# Patient Record
Sex: Female | Born: 2000
Health system: Southern US, Community
[De-identification: ages and names within clinical notes are randomized; demographics above are authoritative.]

## PROBLEM LIST (undated history)

## (undated) DIAGNOSIS — N390 Urinary tract infection, site not specified: Secondary | ICD-10-CM

## (undated) DIAGNOSIS — R51 Headache: Secondary | ICD-10-CM

## (undated) DIAGNOSIS — G43909 Migraine, unspecified, not intractable, without status migrainosus: Secondary | ICD-10-CM

## (undated) DIAGNOSIS — R519 Headache, unspecified: Secondary | ICD-10-CM

## (undated) DIAGNOSIS — F32A Depression, unspecified: Secondary | ICD-10-CM

## (undated) DIAGNOSIS — G8220 Paraplegia, unspecified: Secondary | ICD-10-CM

## (undated) DIAGNOSIS — Z9289 Personal history of other medical treatment: Secondary | ICD-10-CM

## (undated) DIAGNOSIS — F419 Anxiety disorder, unspecified: Secondary | ICD-10-CM

## (undated) DIAGNOSIS — Z8041 Family history of malignant neoplasm of ovary: Secondary | ICD-10-CM

## (undated) DIAGNOSIS — W3400XA Accidental discharge from unspecified firearms or gun, initial encounter: Secondary | ICD-10-CM

## (undated) DIAGNOSIS — F329 Major depressive disorder, single episode, unspecified: Secondary | ICD-10-CM

## (undated) DIAGNOSIS — L97519 Non-pressure chronic ulcer of other part of right foot with unspecified severity: Secondary | ICD-10-CM

## (undated) HISTORY — DX: Anxiety disorder, unspecified: F41.9

## (undated) HISTORY — DX: Urinary tract infection, site not specified: N39.0

## (undated) HISTORY — DX: Major depressive disorder, single episode, unspecified: F32.9

## (undated) HISTORY — DX: Family history of malignant neoplasm of ovary: Z80.41

## (undated) HISTORY — DX: Depression, unspecified: F32.A

---
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
## 2018-01-07 ENCOUNTER — Encounter: Payer: BLUE CROSS/BLUE SHIELD | Attending: Internal Medicine | Admitting: Internal Medicine

## 2018-01-07 DIAGNOSIS — G8222 Paraplegia, incomplete: Secondary | ICD-10-CM | POA: Insufficient documentation

## 2018-01-07 DIAGNOSIS — L03115 Cellulitis of right lower limb: Secondary | ICD-10-CM | POA: Insufficient documentation

## 2018-01-07 DIAGNOSIS — L89892 Pressure ulcer of other site, stage 2: Secondary | ICD-10-CM | POA: Diagnosis not present
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
## 2018-01-08 NOTE — Progress Notes (Signed)
Angela, [name] ([id number]) Visit Report for 01/07/2018 Arrival Information Details Patient Name: Angela [name], Angela [name] Date of Service: 01/07/2018 2:30 PM Medical Record Number: [id number] Patient Account Number: [id number] Date of Birth/Sex: 2001-01-29 (17 y.o. F) Treating RN: Rema Jasmine Primary Care Jenniger Figiel: Roda Shutters Other Clinician: Referring Daleigh Pollinger: Roda Shutters Treating Baila Rouse/Extender: Altamese [place] in Treatment: 2 Visit Information History Since Last Visit Added or deleted any medications: No Patient Arrived: Wheel Chair Any new allergies or adverse reactions: No Arrival Time: 14:27 Had a fall or experienced change in No activities of daily living that may affect Accompanied By: dad risk of falls: Transfer Assistance: None Signs or symptoms of abuse/neglect since last visito No Patient Identification Verified: Yes Hospitalized since last visit: No Secondary Verification Process Completed: Yes Implantable device outside of the clinic excluding No Patient Requires Transmission-Based No cellular tissue based products placed in the center Precautions: since last visit: Patient Has Alerts: No Has Dressing in Place as Prescribed: Yes Pain Present Now: No Electronic Signature(s) Signed: 01/07/2018 5:04:11 PM By: Rema Jasmine Entered By: Rema Jasmine on 01/07/2018 14:39:32 Angela [name] ([id number]) -------------------------------------------------------------------------------- Encounter Discharge Information Details Patient Name: Angela [name] Date of Service: 01/07/2018 2:30 PM Medical Record Number: [id number] Patient Account Number: [id number] Date of Birth/Sex: 2001-05-16 (16 y.o. F) Treating RN: Curtis Sites Primary Care Rozelia Catapano: Roda Shutters Other Clinician: Referring Garet Hooton: Roda Shutters Treating Tiarah Shisler/Extender: Altamese [place] in Treatment: 2 Encounter Discharge Information Items Discharge Condition:  Stable Ambulatory Status: Wheelchair Discharge Destination: Home Transportation: Private Auto Accompanied By: self Schedule Follow-up Appointment: Yes Clinical Summary of Care: Electronic Signature(s) Signed: 01/07/2018 3:25:47 PM By: Curtis Sites Entered By: Curtis Sites on 01/07/2018 15:25:47 Angela [name] ([id number]) -------------------------------------------------------------------------------- Lower Extremity Assessment Details Patient Name: Angela [name] Date of Service: 01/07/2018 2:30 PM Medical Record Number: [id number] Patient Account Number: [id number] Date of Birth/Sex: 12/18/2000 (17 y.o. F) Treating RN: Rema Jasmine Primary Care Dai Mcadams: Roda Shutters Other Clinician: Referring [place] Deleo: Roda Shutters Treating Julieanne Hadsall/Extender: Altamese [place] in Treatment: 2 Edema Assessment Assessed: [Left: No] [Right: No] [Left: Edema] [Right: :] Calf Left: Right: Point of Measurement: 37 cm From Medial Instep cm 36.8 cm Ankle Left: Right: Point of Measurement: 11 cm From Medial Instep cm 26.3 cm Vascular Assessment Claudication: Claudication Assessment [Right:None] Pulses: Dorsalis Pedis Palpable: [Right:Yes] Posterior Tibial Extremity colors, hair growth, and conditions: Extremity Color: [Right:Normal] Hair Growth on Extremity: [Right:No] Temperature of Extremity: [Right:Warm] Capillary Refill: [Right:< 3 seconds] Toe Nail Assessment Left: Right: Thick: No Discolored: No Deformed: No Improper Length and Hygiene: No Electronic Signature(s) Signed: 01/07/2018 5:04:11 PM By: Rema Jasmine Entered By: Rema Jasmine on 01/07/2018 14:46:35 Angela [name] ([id number]) -------------------------------------------------------------------------------- Multi Wound Chart Details Patient Name: Angela Boast I. Date of Service: 01/07/2018 2:30 PM Medical Record Number: [id number] Patient Account Number: [id number] Date of Birth/Sex: [birth year] (16 y.o.  F) Treating RN: Renne Crigler Primary Care Ayat Drenning: Roda Shutters Other Clinician: Referring Nataline Basara: Roda Shutters Treating Dustan Hyams/Extender: Altamese [place] in Treatment: 2 Vital Signs Height(in): 66 Pulse(bpm): 92 Weight(lbs): 190 Blood Pressure(mmHg): 114/74 Body Mass Index(BMI): 31 Temperature(F): 98.4 Respiratory Rate 16 (breaths/min): Photos: [1:No Photos] [N/A:N/A] Wound Location: [1:Right Metatarsal head fifth - Lateral] [N/A:N/A] Wounding Event: [1:Gradually Appeared] [N/A:N/A] Primary Etiology: [1:Pressure Ulcer] [N/A:N/A] Comorbid History: [1:Anemia, Paraplegia] [N/A:N/A] Date Acquired: [1:09/23/2017] [N/A:N/A] Weeks of Treatment: [1:2] [N/A:N/A] Wound Status: [1:Open] [N/A:N/A] Measurements L x W x D [1:0.9x0.8x0.7] [N/A:N/A] (cm) Area (cm) : [1:0.565] [N/A:N/A] Volume (cm) : [1:0.396] [N/A:N/A] %  Reduction in Area: [1:11.20%] [N/A:N/A] % Reduction in Volume: [1:37.70%] [N/A:N/A] Classification: [1:Category/Stage II] [N/A:N/A] Exudate Amount: [1:Medium] [N/A:N/A] Exudate Type: [1:Serosanguineous] [N/A:N/A] Exudate Color: [1:red, brown] [N/A:N/A] Wound Margin: [1:Distinct, outline attached] [N/A:N/A] Granulation Amount: [1:Large (67-100%)] [N/A:N/A] Granulation Quality: [1:Red] [N/A:N/A] Necrotic Amount: [1:Small (1-33%)] [N/A:N/A] Necrotic Tissue: [1:Eschar] [N/A:N/A] Exposed Structures: [1:Fascia: No Fat Layer (Subcutaneous Tissue) Exposed: No Tendon: No Muscle: No Joint: No Bone: No] [N/A:N/A] Epithelialization: [1:None] [N/A:N/A] Periwound Skin Texture: [1:Callus: Yes] [N/A:N/A] Periwound Skin Moisture: [1:No Abnormalities Noted] [N/A:N/A] Periwound Skin Color: [1:Erythema: Yes] [N/A:N/A] Erythema Location: [1:Circumferential] [N/A:N/A] Temperature: [1:No Abnormality] [N/A:N/A] Tenderness on Palpation: Yes N/A N/A Wound Preparation: Ulcer Cleansing: N/A N/A Rinsed/Irrigated with Saline Topical Anesthetic Applied: Other:  lidocaine 4% Treatment Notes Electronic Signature(s) Signed: 01/07/2018 5:14:04 PM By: Renne Crigler Entered By: Renne Crigler on 01/07/2018 14:55:48 Angela [name] ([id number]) -------------------------------------------------------------------------------- Multi-Disciplinary Care Plan Details Patient Name: Angela [name] Date of Service: 01/07/2018 2:30 PM Medical Record Number: [id number] Patient Account Number: [id number] Date of Birth/Sex: 03-Jan-2001 (17 y.o. F) Treating RN: Renne Crigler Primary Care Cylis Ayars: Roda Shutters Other Clinician: Referring Hassen Bruun: Roda Shutters Treating Yamna Mackel/Extender: Altamese [place] in Treatment: 2 Active Inactive ` Orientation to the Wound Care Program Nursing Diagnoses: Knowledge deficit related to the wound healing center program Goals: Patient/caregiver will verbalize understanding of the Wound Healing Center Program Date Initiated: 12/24/2017 Target Resolution Date: 01/24/2018 Goal Status: Active Interventions: Provide education on orientation to the wound center Notes: ` Pressure Nursing Diagnoses: Potential for impaired tissue integrity related to pressure, friction, moisture, and shear Goals: Patient/caregiver will verbalize understanding of pressure ulcer management Date Initiated: 12/24/2017 Target Resolution Date: 01/24/2018 Goal Status: Active Interventions: Assess potential for pressure ulcer upon admission and as needed Notes: ` Wound/Skin Impairment Nursing Diagnoses: Impaired tissue integrity Goals: Ulcer/skin breakdown will heal within 14 weeks Date Initiated: 12/24/2017 Target Resolution Date: 03/27/2018 Goal Status: Active Interventions: TOYOKO, SILOS I. ([id number]) Provide education on ulcer and skin care Treatment Activities: Skin care regimen initiated : 12/24/2017 Notes: Electronic Signature(s) Signed: 01/07/2018 5:14:04 PM By: Renne Crigler Entered By: Renne Crigler on  01/07/2018 14:55:40 Angela Boast IMarland Kitchen ([id number]) -------------------------------------------------------------------------------- Pain Assessment Details Patient Name: Angela [name] Date of Service: 01/07/2018 2:30 PM Medical Record Number: [id number] Patient Account Number: [id number] Date of Birth/Sex: 07-07-2000 (17 y.o. F) Treating RN: Rema Jasmine Primary Care Tamiah Dysart: Roda Shutters Other Clinician: Referring Brea Coleson: Roda Shutters Treating Travonna Swindle/Extender: Altamese [place] in Treatment: 2 Active Problems Location of Pain Severity and Description of Pain Patient Has Paino No Site Locations Pain Management and Medication Current Pain Management: Electronic Signature(s) Signed: 01/07/2018 5:04:11 PM By: Rema Jasmine Entered By: Rema Jasmine on 01/07/2018 14:39:44 Angela [name] ([id number]) -------------------------------------------------------------------------------- Patient/Caregiver Education Details Patient Name: Angela [name] Date of Service: 01/07/2018 2:30 PM Medical Record Number: [id number] Patient Account Number: [id number] Date of Birth/Gender: 2001-05-09 (16 y.o. F) Treating RN: Curtis Sites Primary Care Physician: Roda Shutters Other Clinician: Referring Physician: Roda Shutters Treating Physician/Extender: Altamese [place] in Treatment: 2 Education Assessment Education Provided To: Patient Education Topics Provided Wound/Skin Impairment: Handouts: Other: wound care as ordered Methods: Demonstration, Explain/Verbal Responses: State content correctly Electronic Signature(s) Signed: 01/07/2018 5:21:12 PM By: Curtis Sites Entered By: Curtis Sites on 01/07/2018 15:26:38 Angela [name] ([id number]) -------------------------------------------------------------------------------- Wound Assessment Details Patient Name: Angela [name] Date of Service: 01/07/2018 2:30 PM Medical Record Number: [id number] Patient Account  Number: [id number] Date of Birth/Sex: Sep 17, 2000 (16 y.o. F) Treating RN: Rema Jasmine Primary Care Shevelle Smither: Noralyn Pick  HILLARY Other Clinician: Referring Dajsha Massaro: Roda ShuttersARROLL, HILLARY Treating Marks Scalera/Extender: Maxwell CaulOBSON, MICHAEL G Weeks in Treatment: 2 Wound Status Wound Number: 1 Primary Etiology: Pressure Ulcer Wound Location: Right Metatarsal head fifth - Lateral Wound Status: Open Wounding Event: Gradually Appeared Comorbid History: Anemia, Paraplegia Date Acquired: 09/23/2017 Weeks Of Treatment: 2 Clustered Wound: No Wound Measurements Length: (cm) 0.9 Width: (cm) 0.8 Depth: (cm) 0.7 Area: (cm) 0.565 Volume: (cm) 0.396 % Reduction in Area: 11.2% % Reduction in Volume: 37.7% Epithelialization: None Tunneling: No Undermining: No Wound Description Classification: Category/Stage II Wound Margin: Distinct, outline attached Exudate Amount: Medium Exudate Type: Serosanguineous Exudate Color: red, brown Foul Odor After Cleansing: No Slough/Fibrino No Wound Bed Granulation Amount: Large (67-100%) Exposed Structure Granulation Quality: Red Fascia Exposed: No Necrotic Amount: Small (1-33%) Fat Layer (Subcutaneous Tissue) Exposed: No Necrotic Quality: Eschar Tendon Exposed: No Muscle Exposed: No Joint Exposed: No Bone Exposed: No Periwound Skin Texture Texture Color No Abnormalities Noted: No No Abnormalities Noted: No Callus: Yes Erythema: Yes Erythema Location: Circumferential Moisture No Abnormalities Noted: No Temperature / Pain Temperature: No Abnormality Tenderness on Palpation: Yes Wound Preparation Ulcer Cleansing: Rinsed/Irrigated with Saline Topical Anesthetic Applied: Other: lidocaine 4%, Treatment Notes Angela [name], Angela I. ([id number]) Wound #1 (Right, Lateral Metatarsal head fifth) 1. Cleansed with: Clean wound with Normal Saline 2. Anesthetic Topical Lidocaine 4% cream to wound bed prior to debridement 4. Dressing Applied: Other dressing (specify in  notes) 5. Secondary Dressing Applied ABD Pad Dry Gauze Kerlix/Conform Notes endoform, abd conform wrap with stretch net Electronic Signature(s) Signed: 01/07/2018 5:04:11 PM By: Rema JasmineNg, Wendi Entered By: Rema JasmineNg, Wendi on 01/07/2018 14:43:32 Angela [name], Angela I. ([id number]) -------------------------------------------------------------------------------- Vitals Details Patient Name: Angela [name], Angela I. Date of Service: 01/07/2018 2:30 PM Medical Record Number: [id number] Patient Account Number: [id number] Date of Birth/Sex: 12/14/2000 (16 y.o. F) Treating RN: Rema JasmineNg, Wendi Primary Care Hammond Obeirne: Roda ShuttersARROLL, HILLARY Other Clinician: Referring Copeland Neisen: Roda ShuttersARROLL, HILLARY Treating Yassin Scales/Extender: Altamese CarolinaOBSON, MICHAEL G Weeks in Treatment: 2 Vital Signs Time Taken: 14:30 Temperature (F): 98.4 Height (in): 66 Pulse (bpm): 92 Weight (lbs): 190 Respiratory Rate (breaths/min): 16 Body Mass Index (BMI): 30.7 Blood Pressure (mmHg): 114/74 Reference Range: 80 - 120 mg / dl Electronic Signature(s) Signed: 01/07/2018 5:04:11 PM By: Rema JasmineNg, Wendi Entered By: Rema JasmineNg, Wendi on 01/07/2018 14:40:13

## 2018-01-13 ENCOUNTER — Emergency Department (HOSPITAL_COMMUNITY): Payer: BLUE CROSS/BLUE SHIELD

## 2018-01-13 ENCOUNTER — Other Ambulatory Visit: Payer: Self-pay

## 2018-01-13 ENCOUNTER — Encounter (HOSPITAL_COMMUNITY): Payer: Self-pay | Admitting: Emergency Medicine

## 2018-01-13 ENCOUNTER — Inpatient Hospital Stay (HOSPITAL_COMMUNITY)
Admission: EM | Admit: 2018-01-13 | Discharge: 2018-01-22 | DRG: 854 | Disposition: A | Discharge status: Home / Self Care | Payer: BLUE CROSS/BLUE SHIELD | Attending: Internal Medicine | Admitting: Internal Medicine

## 2018-01-13 DIAGNOSIS — D649 Anemia, unspecified: Secondary | ICD-10-CM | POA: Diagnosis not present

## 2018-01-13 DIAGNOSIS — M7989 Other specified soft tissue disorders: Secondary | ICD-10-CM | POA: Diagnosis not present

## 2018-01-13 DIAGNOSIS — B9561 Methicillin susceptible Staphylococcus aureus infection as the cause of diseases classified elsewhere: Secondary | ICD-10-CM | POA: Diagnosis not present

## 2018-01-13 DIAGNOSIS — L03115 Cellulitis of right lower limb: Secondary | ICD-10-CM

## 2018-01-13 DIAGNOSIS — W3400XS Accidental discharge from unspecified firearms or gun, sequela: Secondary | ICD-10-CM

## 2018-01-13 DIAGNOSIS — M19071 Primary osteoarthritis, right ankle and foot: Secondary | ICD-10-CM | POA: Diagnosis not present

## 2018-01-13 DIAGNOSIS — M00079 Staphylococcal arthritis, unspecified ankle and foot: Secondary | ICD-10-CM | POA: Diagnosis not present

## 2018-01-13 DIAGNOSIS — Z978 Presence of other specified devices: Secondary | ICD-10-CM | POA: Diagnosis not present

## 2018-01-13 DIAGNOSIS — S91301A Unspecified open wound, right foot, initial encounter: Secondary | ICD-10-CM | POA: Diagnosis not present

## 2018-01-13 DIAGNOSIS — Z793 Long term (current) use of hormonal contraceptives: Secondary | ICD-10-CM | POA: Diagnosis not present

## 2018-01-13 DIAGNOSIS — A419 Sepsis, unspecified organism: Principal | ICD-10-CM | POA: Diagnosis present

## 2018-01-13 DIAGNOSIS — F32A Depression, unspecified: Secondary | ICD-10-CM | POA: Diagnosis present

## 2018-01-13 DIAGNOSIS — Z91040 Latex allergy status: Secondary | ICD-10-CM | POA: Diagnosis not present

## 2018-01-13 DIAGNOSIS — G8220 Paraplegia, unspecified: Secondary | ICD-10-CM | POA: Diagnosis present

## 2018-01-13 DIAGNOSIS — Z79899 Other long term (current) drug therapy: Secondary | ICD-10-CM | POA: Diagnosis not present

## 2018-01-13 DIAGNOSIS — L97511 Non-pressure chronic ulcer of other part of right foot limited to breakdown of skin: Secondary | ICD-10-CM | POA: Diagnosis not present

## 2018-01-13 DIAGNOSIS — M009 Pyogenic arthritis, unspecified: Secondary | ICD-10-CM | POA: Diagnosis not present

## 2018-01-13 DIAGNOSIS — S91101A Unspecified open wound of right great toe without damage to nail, initial encounter: Secondary | ICD-10-CM | POA: Diagnosis not present

## 2018-01-13 DIAGNOSIS — A4901 Methicillin susceptible Staphylococcus aureus infection, unspecified site: Secondary | ICD-10-CM | POA: Diagnosis present

## 2018-01-13 DIAGNOSIS — Z881 Allergy status to other antibiotic agents status: Secondary | ICD-10-CM

## 2018-01-13 DIAGNOSIS — M25474 Effusion, right foot: Secondary | ICD-10-CM | POA: Diagnosis not present

## 2018-01-13 DIAGNOSIS — F419 Anxiety disorder, unspecified: Secondary | ICD-10-CM | POA: Diagnosis not present

## 2018-01-13 DIAGNOSIS — T8149XD Infection following a procedure, other surgical site, subsequent encounter: Secondary | ICD-10-CM | POA: Diagnosis not present

## 2018-01-13 DIAGNOSIS — L0390 Cellulitis, unspecified: Secondary | ICD-10-CM | POA: Diagnosis not present

## 2018-01-13 DIAGNOSIS — L97519 Non-pressure chronic ulcer of other part of right foot with unspecified severity: Secondary | ICD-10-CM

## 2018-01-13 DIAGNOSIS — F329 Major depressive disorder, single episode, unspecified: Secondary | ICD-10-CM | POA: Diagnosis not present

## 2018-01-13 DIAGNOSIS — L270 Generalized skin eruption due to drugs and medicaments taken internally: Secondary | ICD-10-CM | POA: Diagnosis not present

## 2018-01-13 DIAGNOSIS — G8194 Hemiplegia, unspecified affecting left nondominant side: Secondary | ICD-10-CM | POA: Diagnosis present

## 2018-01-13 DIAGNOSIS — Z4801 Encounter for change or removal of surgical wound dressing: Secondary | ICD-10-CM | POA: Diagnosis not present

## 2018-01-13 DIAGNOSIS — T368X5A Adverse effect of other systemic antibiotics, initial encounter: Secondary | ICD-10-CM | POA: Diagnosis not present

## 2018-01-13 DIAGNOSIS — F4322 Adjustment disorder with anxiety: Secondary | ICD-10-CM | POA: Diagnosis present

## 2018-01-13 DIAGNOSIS — Z5181 Encounter for therapeutic drug level monitoring: Secondary | ICD-10-CM | POA: Diagnosis not present

## 2018-01-13 DIAGNOSIS — L089 Local infection of the skin and subcutaneous tissue, unspecified: Secondary | ICD-10-CM | POA: Diagnosis not present

## 2018-01-13 DIAGNOSIS — T148XXS Other injury of unspecified body region, sequela: Secondary | ICD-10-CM | POA: Diagnosis not present

## 2018-01-13 DIAGNOSIS — M0000 Staphylococcal arthritis, unspecified joint: Secondary | ICD-10-CM | POA: Diagnosis not present

## 2018-01-13 HISTORY — DX: Headache: R51

## 2018-01-13 HISTORY — DX: Non-pressure chronic ulcer of other part of right foot with unspecified severity: L97.519

## 2018-01-13 HISTORY — DX: Migraine, unspecified, not intractable, without status migrainosus: G43.909

## 2018-01-13 HISTORY — DX: Accidental discharge from unspecified firearms or gun, initial encounter: W34.00XA

## 2018-01-13 HISTORY — DX: Personal history of other medical treatment: Z92.89

## 2018-01-13 HISTORY — DX: Headache, unspecified: R51.9

## 2018-01-13 LAB — COMPREHENSIVE METABOLIC PANEL
ALT: 11 U/L (ref 0–44)
AST: 18 U/L (ref 15–41)
Albumin: 3.4 g/dL — ABNORMAL LOW (ref 3.5–5.0)
Alkaline Phosphatase: 62 U/L (ref 38–126)
Anion gap: 10 (ref 5–15)
BUN: 5 mg/dL — ABNORMAL LOW (ref 6–20)
CO2: 24 mmol/L (ref 22–32)
Calcium: 8.8 mg/dL — ABNORMAL LOW (ref 8.9–10.3)
Chloride: 102 mmol/L (ref 98–111)
Creatinine, Ser: 0.76 mg/dL (ref 0.44–1.00)
GFR calc Af Amer: >60 mL/min (ref >60)
GFR calc non Af Amer: >60 mL/min (ref >60)
Glucose, Bld: 103 mg/dL — ABNORMAL HIGH (ref 70–99)
Potassium: 3.7 mmol/L (ref 3.5–5.1)
Sodium: 136 mmol/L (ref 135–145)
Total Bilirubin: 0.4 mg/dL (ref 0.3–1.2)
Total Protein: 7.1 g/dL (ref 6.5–8.1)

## 2018-01-13 LAB — CBC WITH DIFFERENTIAL/PLATELET
Abs Immature Granulocytes: 0 10*3/uL (ref 0.0–0.1)
Basophils Absolute: 0 10*3/uL (ref 0.0–0.1)
Basophils Relative: 0 %
Eosinophils Absolute: 0.1 10*3/uL (ref 0.0–0.7)
Eosinophils Relative: 1 %
HCT: 39 % (ref 36.0–46.0)
Hemoglobin: 12.7 g/dL (ref 12.0–15.0)
Immature Granulocytes: 0 %
Lymphocytes Relative: 14 %
Lymphs Abs: 1.7 10*3/uL (ref 0.7–4.0)
MCH: 28.4 pg (ref 26.0–34.0)
MCHC: 32.6 g/dL (ref 30.0–36.0)
MCV: 87.2 fL (ref 78.0–100.0)
Monocytes Absolute: 0.8 10*3/uL (ref 0.1–1.0)
Monocytes Relative: 7 %
Neutro Abs: 9.1 10*3/uL — ABNORMAL HIGH (ref 1.7–7.7)
Neutrophils Relative %: 78 %
Platelets: 321 10*3/uL (ref 150–400)
RBC: 4.47 MIL/uL (ref 3.87–5.11)
RDW: 13 % (ref 11.5–15.5)
WBC: 11.9 10*3/uL — ABNORMAL HIGH (ref 4.0–10.5)

## 2018-01-13 LAB — I-STAT CG4 LACTIC ACID, ED: Lactic Acid, Venous: 1.86 mmol/L (ref 0.5–1.9)

## 2018-01-13 LAB — PREGNANCY, URINE: Preg Test, Ur: NEGATIVE

## 2018-01-13 NOTE — ED Triage Notes (Signed)
Patient reports worsening right foot wound infection with redness/swelling/drainage onset 2 months ago .

## 2018-01-13 NOTE — ED Provider Notes (Signed)
Patient placed in Quick Look pathway, seen and evaluated   Chief Complaint: Foot infection  HPI:   Patient presents with a 2 day history with R foot pain, swelling, redness, around a chronic wound that she sees wound clinic for. She has had active draining from the wound. Her pain and swelling is working up her leg. She is paraplegic in a wheelchair, but does sometimes use a walker but not able to due to foot pain.  ROS: Foot pain, skin change, fever  Physical Exam:   Gen: No distress  Neuro: Awake and Alert  Skin: Warm    Focused Exam: Significant redness, swelling, tenderness of R foot spreading from open, chronic appearing wound on lateral, plantar wound near the 5th toe (no active draining visualized); TTP to lower leg; lungs CTA, tachycardic, febrile 100.6   Initiation of care has begun. The patient has been counseled on the process, plan, and necessity for staying for the completion/evaluation, and the remainder of the medical screening examination    Verdis PrimeLaw, Enma Maeda M, PA-C 01/13/18 1953    Linwood DibblesKnapp, Jon, MD 01/14/18 [phone]

## 2018-01-14 ENCOUNTER — Other Ambulatory Visit: Payer: Self-pay

## 2018-01-14 ENCOUNTER — Inpatient Hospital Stay (HOSPITAL_COMMUNITY): Payer: BLUE CROSS/BLUE SHIELD | Admitting: Anesthesiology

## 2018-01-14 ENCOUNTER — Encounter (HOSPITAL_COMMUNITY)
Admission: EM | Disposition: A | Discharge status: Home / Self Care | Payer: Self-pay | Source: Home / Self Care | Attending: Internal Medicine

## 2018-01-14 ENCOUNTER — Emergency Department (HOSPITAL_COMMUNITY): Payer: BLUE CROSS/BLUE SHIELD

## 2018-01-14 ENCOUNTER — Encounter (HOSPITAL_COMMUNITY): Payer: Self-pay

## 2018-01-14 DIAGNOSIS — F329 Major depressive disorder, single episode, unspecified: Secondary | ICD-10-CM | POA: Diagnosis present

## 2018-01-14 DIAGNOSIS — Z79899 Other long term (current) drug therapy: Secondary | ICD-10-CM | POA: Diagnosis not present

## 2018-01-14 DIAGNOSIS — W3400XS Accidental discharge from unspecified firearms or gun, sequela: Secondary | ICD-10-CM

## 2018-01-14 DIAGNOSIS — Z881 Allergy status to other antibiotic agents status: Secondary | ICD-10-CM | POA: Diagnosis not present

## 2018-01-14 DIAGNOSIS — D649 Anemia, unspecified: Secondary | ICD-10-CM | POA: Diagnosis present

## 2018-01-14 DIAGNOSIS — A419 Sepsis, unspecified organism: Secondary | ICD-10-CM | POA: Diagnosis not present

## 2018-01-14 DIAGNOSIS — T148XXS Other injury of unspecified body region, sequela: Secondary | ICD-10-CM | POA: Diagnosis not present

## 2018-01-14 DIAGNOSIS — T8149XD Infection following a procedure, other surgical site, subsequent encounter: Secondary | ICD-10-CM | POA: Diagnosis not present

## 2018-01-14 DIAGNOSIS — L0390 Cellulitis, unspecified: Secondary | ICD-10-CM | POA: Diagnosis not present

## 2018-01-14 DIAGNOSIS — Z91040 Latex allergy status: Secondary | ICD-10-CM

## 2018-01-14 DIAGNOSIS — G8220 Paraplegia, unspecified: Secondary | ICD-10-CM | POA: Diagnosis not present

## 2018-01-14 DIAGNOSIS — M25474 Effusion, right foot: Secondary | ICD-10-CM

## 2018-01-14 DIAGNOSIS — L03115 Cellulitis of right lower limb: Secondary | ICD-10-CM | POA: Diagnosis not present

## 2018-01-14 DIAGNOSIS — Z793 Long term (current) use of hormonal contraceptives: Secondary | ICD-10-CM | POA: Diagnosis not present

## 2018-01-14 DIAGNOSIS — A4901 Methicillin susceptible Staphylococcus aureus infection, unspecified site: Secondary | ICD-10-CM | POA: Diagnosis present

## 2018-01-14 DIAGNOSIS — B9561 Methicillin susceptible Staphylococcus aureus infection as the cause of diseases classified elsewhere: Secondary | ICD-10-CM | POA: Diagnosis not present

## 2018-01-14 DIAGNOSIS — S91301A Unspecified open wound, right foot, initial encounter: Secondary | ICD-10-CM | POA: Diagnosis not present

## 2018-01-14 DIAGNOSIS — L089 Local infection of the skin and subcutaneous tissue, unspecified: Secondary | ICD-10-CM | POA: Diagnosis present

## 2018-01-14 DIAGNOSIS — M0000 Staphylococcal arthritis, unspecified joint: Secondary | ICD-10-CM | POA: Diagnosis not present

## 2018-01-14 DIAGNOSIS — F4322 Adjustment disorder with anxiety: Secondary | ICD-10-CM | POA: Diagnosis present

## 2018-01-14 DIAGNOSIS — Z978 Presence of other specified devices: Secondary | ICD-10-CM | POA: Diagnosis not present

## 2018-01-14 DIAGNOSIS — L270 Generalized skin eruption due to drugs and medicaments taken internally: Secondary | ICD-10-CM | POA: Diagnosis not present

## 2018-01-14 DIAGNOSIS — S91101A Unspecified open wound of right great toe without damage to nail, initial encounter: Secondary | ICD-10-CM | POA: Diagnosis not present

## 2018-01-14 DIAGNOSIS — G8194 Hemiplegia, unspecified affecting left nondominant side: Secondary | ICD-10-CM | POA: Diagnosis not present

## 2018-01-14 DIAGNOSIS — F419 Anxiety disorder, unspecified: Secondary | ICD-10-CM | POA: Diagnosis not present

## 2018-01-14 DIAGNOSIS — Z4801 Encounter for change or removal of surgical wound dressing: Secondary | ICD-10-CM | POA: Diagnosis not present

## 2018-01-14 DIAGNOSIS — F32A Depression, unspecified: Secondary | ICD-10-CM | POA: Diagnosis present

## 2018-01-14 DIAGNOSIS — T368X5A Adverse effect of other systemic antibiotics, initial encounter: Secondary | ICD-10-CM | POA: Diagnosis not present

## 2018-01-14 DIAGNOSIS — M00079 Staphylococcal arthritis, unspecified ankle and foot: Secondary | ICD-10-CM | POA: Diagnosis not present

## 2018-01-14 DIAGNOSIS — M009 Pyogenic arthritis, unspecified: Secondary | ICD-10-CM | POA: Diagnosis present

## 2018-01-14 DIAGNOSIS — M19071 Primary osteoarthritis, right ankle and foot: Secondary | ICD-10-CM | POA: Diagnosis present

## 2018-01-14 DIAGNOSIS — L97511 Non-pressure chronic ulcer of other part of right foot limited to breakdown of skin: Secondary | ICD-10-CM | POA: Diagnosis not present

## 2018-01-14 HISTORY — PX: I & D EXTREMITY: SHX5045

## 2018-01-14 LAB — MRSA PCR SCREENING: MRSA BY PCR: NEGATIVE

## 2018-01-14 LAB — CK: Total CK: 81 U/L (ref 38–234)

## 2018-01-14 LAB — I-STAT CG4 LACTIC ACID, ED: Lactic Acid, Venous: 0.97 mmol/L (ref 0.5–1.9)

## 2018-01-14 SURGERY — IRRIGATION AND DEBRIDEMENT EXTREMITY
Anesthesia: General | Site: Foot | Laterality: Right

## 2018-01-14 MED ORDER — DIPHENHYDRAMINE HCL 50 MG/ML IJ SOLN
25.0000 mg | Freq: Four times a day (QID) | INTRAMUSCULAR | Status: DC | PRN
  Administered 2018-01-14 – 2018-01-20 (×9): 25 mg via INTRAVENOUS
  Filled 2018-01-14 (×10): qty 1

## 2018-01-14 MED ORDER — CEFAZOLIN SODIUM-DEXTROSE 2-4 GM/100ML-% IV SOLN
2.0000 g | Freq: Three times a day (TID) | INTRAVENOUS | Status: DC
  Administered 2018-01-14 – 2018-01-21 (×22): 2 g via INTRAVENOUS
  Filled 2018-01-14 (×23): qty 100

## 2018-01-14 MED ORDER — FENTANYL CITRATE (PF) 250 MCG/5ML IJ SOLN
INTRAMUSCULAR | Status: DC | PRN
  Administered 2018-01-14: 25 ug via INTRAVENOUS

## 2018-01-14 MED ORDER — CHLORHEXIDINE GLUCONATE 4 % EX LIQD
60.0000 mL | Freq: Once | CUTANEOUS | Status: AC
  Administered 2018-01-14: 4 via TOPICAL

## 2018-01-14 MED ORDER — MIDAZOLAM HCL 2 MG/2ML IJ SOLN
INTRAMUSCULAR | Status: AC
  Filled 2018-01-14: qty 2

## 2018-01-14 MED ORDER — BACLOFEN 10 MG PO TABS
10.0000 mg | ORAL_TABLET | Freq: Three times a day (TID) | ORAL | Status: DC | PRN
  Administered 2018-01-14 – 2018-01-20 (×4): 10 mg via ORAL
  Filled 2018-01-14 (×5): qty 1

## 2018-01-14 MED ORDER — SODIUM CHLORIDE 0.9 % IR SOLN
Status: DC | PRN
  Administered 2018-01-14: 1000 mL
  Administered 2018-01-14: 3000 mL

## 2018-01-14 MED ORDER — HYDROCODONE-ACETAMINOPHEN 5-325 MG PO TABS
1.0000 | ORAL_TABLET | Freq: Four times a day (QID) | ORAL | Status: DC | PRN
  Administered 2018-01-14 – 2018-01-15 (×4): 1 via ORAL
  Filled 2018-01-14 (×4): qty 1

## 2018-01-14 MED ORDER — SODIUM CHLORIDE 0.9 % IV SOLN
500.0000 mg | INTRAVENOUS | Status: DC
  Filled 2018-01-14: qty 10

## 2018-01-14 MED ORDER — VANCOMYCIN HCL 10 G IV SOLR
1500.0000 mg | Freq: Once | INTRAVENOUS | Status: AC
  Administered 2018-01-14: 1500 mg via INTRAVENOUS
  Filled 2018-01-14: qty 1500

## 2018-01-14 MED ORDER — OXYCODONE HCL 5 MG/5ML PO SOLN: 5.0000 mg | Freq: Once | ORAL | Status: DC | PRN

## 2018-01-14 MED ORDER — ONDANSETRON HCL 4 MG PO TABS: 4.0000 mg | ORAL_TABLET | Freq: Four times a day (QID) | ORAL | Status: DC | PRN

## 2018-01-14 MED ORDER — LACTATED RINGERS IV SOLN
INTRAVENOUS | Status: DC | PRN
  Administered 2018-01-14: 22:00:00 via INTRAVENOUS

## 2018-01-14 MED ORDER — VANCOMYCIN HCL IN DEXTROSE 1-5 GM/200ML-% IV SOLN
1000.0000 mg | Freq: Three times a day (TID) | INTRAVENOUS | Status: DC
  Filled 2018-01-14: qty 200

## 2018-01-14 MED ORDER — NORETHIN ACE-ETH ESTRAD-FE 1-20 MG-MCG PO TABS
1.0000 | ORAL_TABLET | Freq: Every day | ORAL | Status: DC
  Administered 2018-01-15 – 2018-01-22 (×7): 1 via ORAL

## 2018-01-14 MED ORDER — METOCLOPRAMIDE HCL 5 MG PO TABS: 5.0000 mg | ORAL_TABLET | Freq: Three times a day (TID) | ORAL | Status: DC | PRN

## 2018-01-14 MED ORDER — DOCUSATE SODIUM 100 MG PO CAPS
100.0000 mg | ORAL_CAPSULE | Freq: Two times a day (BID) | ORAL | Status: DC
  Administered 2018-01-15 – 2018-01-22 (×13): 100 mg via ORAL
  Filled 2018-01-14 (×13): qty 1

## 2018-01-14 MED ORDER — ONDANSETRON HCL 4 MG PO TABS
4.0000 mg | ORAL_TABLET | Freq: Four times a day (QID) | ORAL | Status: DC | PRN
  Administered 2018-01-20: 4 mg via ORAL
  Filled 2018-01-14: qty 1

## 2018-01-14 MED ORDER — OXYCODONE HCL 5 MG PO TABS: 5.0000 mg | ORAL_TABLET | Freq: Once | ORAL | Status: DC | PRN

## 2018-01-14 MED ORDER — LIDOCAINE 2% (20 MG/ML) 5 ML SYRINGE
INTRAMUSCULAR | Status: AC
  Filled 2018-01-14: qty 5

## 2018-01-14 MED ORDER — CEFAZOLIN SODIUM-DEXTROSE 1-4 GM/50ML-% IV SOLN
1.0000 g | Freq: Three times a day (TID) | INTRAVENOUS | Status: DC
  Filled 2018-01-14: qty 50

## 2018-01-14 MED ORDER — PROPOFOL 10 MG/ML IV BOLUS
INTRAVENOUS | Status: AC
  Filled 2018-01-14: qty 20

## 2018-01-14 MED ORDER — GADOBENATE DIMEGLUMINE 529 MG/ML IV SOLN
20.0000 mL | Freq: Once | INTRAVENOUS | Status: AC | PRN
  Administered 2018-01-14: 18 mL via INTRAVENOUS

## 2018-01-14 MED ORDER — VANCOMYCIN HCL IN DEXTROSE 1-5 GM/200ML-% IV SOLN: 1000.0000 mg | Freq: Once | INTRAVENOUS | Status: DC

## 2018-01-14 MED ORDER — AMITRIPTYLINE HCL 25 MG PO TABS
25.0000 mg | ORAL_TABLET | Freq: Every day | ORAL | Status: DC
  Administered 2018-01-14 – 2018-01-20 (×7): 25 mg via ORAL
  Filled 2018-01-14 (×7): qty 1

## 2018-01-14 MED ORDER — ENOXAPARIN SODIUM 40 MG/0.4ML ~~LOC~~ SOLN
40.0000 mg | SUBCUTANEOUS | Status: DC
  Administered 2018-01-15 – 2018-01-21 (×7): 40 mg via SUBCUTANEOUS
  Filled 2018-01-14 (×7): qty 0.4

## 2018-01-14 MED ORDER — FENTANYL CITRATE (PF) 100 MCG/2ML IJ SOLN: 25.0000 ug | INTRAMUSCULAR | Status: DC | PRN

## 2018-01-14 MED ORDER — SODIUM CHLORIDE 0.9 % IV SOLN
INTRAVENOUS | Status: DC
  Administered 2018-01-15: via INTRAVENOUS

## 2018-01-14 MED ORDER — ONDANSETRON HCL 4 MG/2ML IJ SOLN
4.0000 mg | Freq: Four times a day (QID) | INTRAMUSCULAR | Status: DC | PRN
  Administered 2018-01-19 – 2018-01-21 (×2): 4 mg via INTRAVENOUS
  Filled 2018-01-14 (×2): qty 2

## 2018-01-14 MED ORDER — ONDANSETRON HCL 4 MG/2ML IJ SOLN
INTRAMUSCULAR | Status: DC | PRN
  Administered 2018-01-14: 4 mg via INTRAVENOUS

## 2018-01-14 MED ORDER — ESCITALOPRAM OXALATE 10 MG PO TABS
10.0000 mg | ORAL_TABLET | Freq: Every day | ORAL | Status: DC
  Administered 2018-01-14 – 2018-01-20 (×7): 10 mg via ORAL
  Filled 2018-01-14 (×9): qty 1

## 2018-01-14 MED ORDER — ACETAMINOPHEN 325 MG PO TABS
650.0000 mg | ORAL_TABLET | Freq: Four times a day (QID) | ORAL | Status: DC | PRN
  Administered 2018-01-17: 650 mg via ORAL
  Filled 2018-01-14: qty 2

## 2018-01-14 MED ORDER — LINEZOLID 600 MG/300ML IV SOLN: 600.0000 mg | Freq: Two times a day (BID) | INTRAVENOUS | Status: DC

## 2018-01-14 MED ORDER — GABAPENTIN 300 MG PO CAPS
600.0000 mg | ORAL_CAPSULE | Freq: Three times a day (TID) | ORAL | Status: DC
  Administered 2018-01-14 – 2018-01-22 (×25): 600 mg via ORAL
  Filled 2018-01-14 (×25): qty 2

## 2018-01-14 MED ORDER — LIDOCAINE 2% (20 MG/ML) 5 ML SYRINGE
INTRAMUSCULAR | Status: DC | PRN
  Administered 2018-01-14: 50 mg via INTRAVENOUS

## 2018-01-14 MED ORDER — ACETAMINOPHEN 650 MG RE SUPP: 650.0000 mg | Freq: Four times a day (QID) | RECTAL | Status: DC | PRN

## 2018-01-14 MED ORDER — PROPOFOL 10 MG/ML IV BOLUS
INTRAVENOUS | Status: DC | PRN
  Administered 2018-01-14: 20 mg via INTRAVENOUS
  Administered 2018-01-14: 100 mg via INTRAVENOUS

## 2018-01-14 MED ORDER — CEFAZOLIN SODIUM-DEXTROSE 1-4 GM/50ML-% IV SOLN
1.0000 g | Freq: Once | INTRAVENOUS | Status: AC
  Administered 2018-01-14: 1 g via INTRAVENOUS
  Filled 2018-01-14: qty 50

## 2018-01-14 MED ORDER — MORPHINE SULFATE (PF) 4 MG/ML IV SOLN
4.0000 mg | Freq: Once | INTRAVENOUS | Status: AC
  Administered 2018-01-14: 4 mg via INTRAVENOUS
  Filled 2018-01-14: qty 1

## 2018-01-14 MED ORDER — ONDANSETRON HCL 4 MG/2ML IJ SOLN
4.0000 mg | Freq: Four times a day (QID) | INTRAMUSCULAR | Status: DC | PRN
  Filled 2018-01-14: qty 2

## 2018-01-14 MED ORDER — METOCLOPRAMIDE HCL 5 MG/ML IJ SOLN: 5.0000 mg | Freq: Three times a day (TID) | INTRAMUSCULAR | Status: DC | PRN

## 2018-01-14 MED ORDER — FENTANYL CITRATE (PF) 250 MCG/5ML IJ SOLN
INTRAMUSCULAR | Status: AC
  Filled 2018-01-14: qty 5

## 2018-01-14 SURGICAL SUPPLY — 51 items
BANDAGE ACE 4X5 VEL STRL LF (GAUZE/BANDAGES/DRESSINGS) ×2 IMPLANT
BNDG COHESIVE 4X5 TAN STRL (GAUZE/BANDAGES/DRESSINGS) ×2 IMPLANT
BNDG GAUZE ELAST 4 BULKY (GAUZE/BANDAGES/DRESSINGS) ×2 IMPLANT
BRUSH SCRUB SURG 4.25 DISP (MISCELLANEOUS) ×2 IMPLANT
CANISTER WOUNDNEG PRESSURE 500 (CANNISTER) ×2 IMPLANT
COVER SURGICAL LIGHT HANDLE (MISCELLANEOUS) ×2 IMPLANT
CUFF TOURNIQUET SINGLE 18IN (TOURNIQUET CUFF) ×2 IMPLANT
CUFF TOURNIQUET SINGLE 24IN (TOURNIQUET CUFF) IMPLANT
CUFF TOURNIQUET SINGLE 34IN LL (TOURNIQUET CUFF) IMPLANT
DRAPE IMP U-DRAPE 54X76 (DRAPES) ×2 IMPLANT
DRAPE U-SHAPE 47X51 STRL (DRAPES) ×2 IMPLANT
DRSG ADAPTIC 3X8 NADH LF (GAUZE/BANDAGES/DRESSINGS) ×2 IMPLANT
DRSG PAD ABDOMINAL 8X10 ST (GAUZE/BANDAGES/DRESSINGS) ×4 IMPLANT
DRSG VAC ATS SM SENSATRAC (GAUZE/BANDAGES/DRESSINGS) ×2 IMPLANT
DURAPREP 26ML APPLICATOR (WOUND CARE) ×2 IMPLANT
ELECT REM PT RETURN 9FT ADLT (ELECTROSURGICAL)
ELECTRODE REM PT RTRN 9FT ADLT (ELECTROSURGICAL) IMPLANT
FACESHIELD WRAPAROUND (MASK) ×2 IMPLANT
GAUZE SPONGE 4X4 12PLY STRL (GAUZE/BANDAGES/DRESSINGS) ×2 IMPLANT
GLOVE BIOGEL PI ORTHO PRO 7.5 (GLOVE) ×1
GLOVE BIOGEL PI ORTHO PRO SZ8 (GLOVE) ×1
GLOVE ORTHO TXT STRL SZ7.5 (GLOVE) ×2 IMPLANT
GLOVE PI ORTHO PRO STRL 7.5 (GLOVE) ×1 IMPLANT
GLOVE PI ORTHO PRO STRL SZ8 (GLOVE) ×1 IMPLANT
GLOVE SURG ORTHO 8.5 STRL (GLOVE) ×2 IMPLANT
GOWN STRL REUS W/ TWL LRG LVL3 (GOWN DISPOSABLE) ×1 IMPLANT
GOWN STRL REUS W/ TWL XL LVL3 (GOWN DISPOSABLE) ×2 IMPLANT
GOWN STRL REUS W/TWL LRG LVL3 (GOWN DISPOSABLE) ×1
GOWN STRL REUS W/TWL XL LVL3 (GOWN DISPOSABLE) ×2
HANDPIECE INTERPULSE COAX TIP (DISPOSABLE) ×1
KIT BASIN OR (CUSTOM PROCEDURE TRAY) ×2 IMPLANT
KIT TURNOVER KIT B (KITS) ×2 IMPLANT
MANIFOLD NEPTUNE II (INSTRUMENTS) ×2 IMPLANT
NS IRRIG 1000ML POUR BTL (IV SOLUTION) ×2 IMPLANT
PACK ORTHO EXTREMITY (CUSTOM PROCEDURE TRAY) ×2 IMPLANT
PAD ARMBOARD 7.5X6 YLW CONV (MISCELLANEOUS) ×4 IMPLANT
PENCIL BUTTON HOLSTER BLD 10FT (ELECTRODE) IMPLANT
SET HNDPC FAN SPRY TIP SCT (DISPOSABLE) ×1 IMPLANT
SPONGE LAP 18X18 X RAY DECT (DISPOSABLE) ×2 IMPLANT
SPONGE LAP 4X18 RFD (DISPOSABLE) ×2 IMPLANT
STOCKINETTE IMPERVIOUS 9X36 MD (GAUZE/BANDAGES/DRESSINGS) ×2 IMPLANT
SUT ETHILON 2 0 FS 18 (SUTURE) IMPLANT
SUT VIC AB 2-0 CT1 27 (SUTURE)
SUT VIC AB 2-0 CT1 TAPERPNT 27 (SUTURE) IMPLANT
SWAB CULTURE ESWAB REG 1ML (MISCELLANEOUS) IMPLANT
TOWEL OR 17X24 6PK STRL BLUE (TOWEL DISPOSABLE) ×2 IMPLANT
TOWEL OR 17X26 10 PK STRL BLUE (TOWEL DISPOSABLE) ×2 IMPLANT
TUBE CONNECTING 12X1/4 (SUCTIONS) ×2 IMPLANT
UNDERPAD 30X30 (UNDERPADS AND DIAPERS) ×2 IMPLANT
WATER STERILE IRR 1000ML POUR (IV SOLUTION) ×2 IMPLANT
YANKAUER SUCT BULB TIP NO VENT (SUCTIONS) ×2 IMPLANT

## 2018-01-14 NOTE — Progress Notes (Signed)
Pharmacy Antibiotic Note  Angela [name] is a 17 y.o. female admitted on 01/13/2018 with R-foot wound. The patient did not tolerate the first dose of Vancomycin even at a slower infusion rate - and developed a rash with the infusion. Pharmacy has been consulted to transition the patient over to Daptomycin. ID has been consulted to see the patient.   Plan: - Daptomycin 500 mg IV every 24 hours (~6 mg/kg) - Baseline CK and weekly CK - Will continue to follow renal function, culture results, LOT, and antibiotic de-escalation plans   Height: 5\' 6"  (167.6 cm) Weight: 190 lb (86.2 kg) IBW/kg (Calculated) : 59.3  Temp (24hrs), Avg:99.7 F (37.6 C), Min:98.4 F (36.9 C), Max:100.8 F (38.2 C)  Recent Labs  Lab 01/13/18 1949 01/13/18 2010 01/14/18 0053  WBC 11.9*  --   --   CREATININE 0.76  --   --   LATICACIDVEN  --  1.86 0.97    Estimated Creatinine Clearance: 126.2 mL/min (by C-G formula based on SCr of 0.76 mg/dL).    Allergies  Allergen Reactions  . Vancomycin Rash    Had red itchy rash of face and neck  . Latex Rash    Antimicrobials this admission: Vanc 7/17 x 1 Daptomycin 7/17 >>  Dose adjustments this admission:  Microbiology results: 7/17 BCx >>  Thank you for allowing pharmacy to be a part of this patient's care.  Georgina PillionElizabeth Tache Bobst, PharmD, BCPS Clinical Pharmacist Pager: [phone] Clinical phone for 01/14/2018 from 7a-3:30p: [phone] If after 3:30p, please call main pharmacy at: [phone] 01/14/2018 1:18 PM

## 2018-01-14 NOTE — Progress Notes (Signed)
Telephone report given to Pre-op. Patient alert and oriented. Vitals taken prior to transfer. Jewelry and cell phone given to boyfriend.

## 2018-01-14 NOTE — ED Notes (Signed)
Ortho at [facility].
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
## 2018-01-14 NOTE — ED Notes (Signed)
Attempted to call report
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
## 2018-01-14 NOTE — Anesthesia Procedure Notes (Signed)
Procedure Name: LMA Insertion Date/Time: 01/14/2018 9:40 PM Performed by: Lucinda Dellecarlo, Allayah Raineri M, CRNA Pre-anesthesia Checklist: Patient identified, Emergency Drugs available, Suction available and Patient being monitored Patient Re-evaluated:Patient Re-evaluated prior to induction Oxygen Delivery Method: Circle system utilized Preoxygenation: Pre-oxygenation with 100% oxygen Induction Type: IV induction Ventilation: Mask ventilation without difficulty LMA: LMA inserted LMA Size: 4.0 Number of attempts: 1 Placement Confirmation: positive ETCO2 and breath sounds checked- equal and bilateral Tube secured with: Tape Dental Injury: Teeth and Oropharynx as per pre-operative assessment

## 2018-01-14 NOTE — Consult Note (Signed)
WOC Nurse wound consult note Reason for Consult: right foot full thickness wound at base of 5th MTP joint Wound type: chronic, infectious   Pressure Injury POA: NA Measurement:1.4cm x 1.6cm x 0.1cm Wound bed: 80% pink 20% yellow loosely adhered slough  Drainage (amount, consistency, odor) small amt serosanguinous, no odor Periwound: calloused, reddened  Dressing procedure/placement/frequency: Patient presented with draining wound which she is followed at the Wound Center for. Her redness surrounding the wound has spread outside of the lines drawn around it on arrival. Pt seen by Ortho, awaiting progress, if no progress may go to OR tomorrow.  MRI suggesting of possible septic arthritis. I have ordered conservative NS wet to dry dressings while we await antibiotics and Ortho decision. We will not follow, but will remain available to this patient, to nursing, and the medical and/or surgical teams.  Please re-consult if we need to assist further.  Barnett HatterMelinda Fateh Kindle, RN-C, WTA-C, OCA Wound Treatment Associate Ostomy Care Associate
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
## 2018-01-14 NOTE — ED Notes (Signed)
Awaiting transport y floor.

## 2018-01-14 NOTE — Progress Notes (Signed)
1230 Pt was pre medicated with Benadryl IV prior to Vanco infusion. Facial redness and rashes to neck noted even with a slow infusion. Dr Radonna RickerFeliz notified, stopped Vanco.

## 2018-01-14 NOTE — Progress Notes (Signed)
Pharmacy Antibiotic Note  Angela [name] is a 17 y.o. female admitted on 01/13/2018 with septic arthritis.  Pharmacy has been consulted for vancomycin dosing. Tmax is 100.8 and WBC is mildly elevated at 11.9. SCr is WNL but slightly above her baseline. Of note, pt with documented allergy to vancomycin but per discussion with MD, this may just be Red Man syndrome so will attempt vancomycin at a slower infusion rate.   Plan: Vancomycin 1500mg  IV x 1 then 1gm IV Q8H - all doses over at least 2 hours, slower if needed. Discussed with RN, pre-treat with benadryl if needed F/u renal fxn, C&S, [facility]anc toleration, may need to switch to an alternate agent if unable to tolerate  Height: 5\' 6"  (167.6 cm) Weight: 190 lb (86.2 kg) IBW/kg (Calculated) : 59.3  Temp (24hrs), Avg:100.2 F (37.9 C), Min:99.3 F (37.4 C), Max:100.8 F (38.2 C)  Recent Labs  Lab 01/13/18 1949 01/13/18 2010 01/14/18 0053  WBC 11.9*  --   --   CREATININE 0.76  --   --   LATICACIDVEN  --  1.86 0.97    Estimated Creatinine Clearance: 126.2 mL/min (by C-G formula based on SCr of 0.76 mg/dL).    Allergies  Allergen Reactions  . Vancomycin Rash    Had red itchy rash of face and neck  . Latex Rash    Antimicrobials this admission: Vanc 7/17>> Cefazolin x 1 7/17  Dose adjustments this admission: N/A  Microbiology results: Pending  Thank you for allowing pharmacy to be a part of this patient's care.  Dalyla Chui, Drake LeachRachel Lynn 01/14/2018 8:42 AM

## 2018-01-14 NOTE — ED Notes (Signed)
Pt given peri pads 
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
## 2018-01-14 NOTE — Brief Op Note (Signed)
01/14/2018  10:27 PM  PATIENT:  Angela [name]  17 y.o. female  PRE-OPERATIVE DIAGNOSIS:  right foot infection  POST-OPERATIVE DIAGNOSIS:  right foot infection  PROCEDURE:  Procedure(s): IRRIGATION AND DEBRIDEMENT FOOT (Right) Placement of wound vac  SURGEON:  Surgeon(s) and Role:    Beverely Low* Alter Moss, MD - Primary  PHYSICIAN ASSISTANT:   ASSISTANTS: none   ANESTHESIA:   general  EBL:  none   BLOOD ADMINISTERED:none  DRAINS: Wound vac   LOCAL MEDICATIONS USED:  NONE  SPECIMEN:  Source of Specimen:  Right foot deep wound culture  DISPOSITION OF SPECIMEN:  micro  COUNTS:  YES  TOURNIQUET:   Total Tourniquet Time Documented: Thigh (Right) - 19 minutes Total: Thigh (Right) - 19 minutes   DICTATION: .Other Dictation: Dictation Number [id number]  PLAN OF CARE: Admit to inpatient   PATIENT DISPOSITION:  PACU - hemodynamically stable.   Delay start of Pharmacological VTE agent (>24hrs) due to surgical blood loss or risk of bleeding: no

## 2018-01-14 NOTE — Transfer of Care (Signed)
Immediate Anesthesia Transfer of Care Note  Patient: Angela BienenstockSarah I [name]  Procedure(s) Performed: IRRIGATION AND DEBRIDEMENT FOOT (Right Foot)  Patient Location: PACU  Anesthesia Type:General  Level of Consciousness: awake, alert , oriented and patient cooperative  Airway & Oxygen Therapy: Patient Spontanous Breathing and Patient connected to nasal cannula oxygen  Post-op Assessment: Report given to RN, Post -op Vital signs reviewed and stable and Patient moving all extremities  Post vital signs: Reviewed and stable  Last Vitals:  Vitals Value Taken Time  BP 130/84 01/14/2018 10:27 PM  Temp    Pulse 109 01/14/2018 10:28 PM  Resp 18 01/14/2018 10:28 PM  SpO2 100 % 01/14/2018 10:28 PM  Vitals shown include unvalidated device data.  Last Pain:  Vitals:   01/14/18 2053  TempSrc: Oral  PainSc:          Complications: No apparent anesthesia complications

## 2018-01-14 NOTE — ED Notes (Signed)
Pt oob to bathroom with her personal wheel chair.

## 2018-01-15 ENCOUNTER — Encounter (HOSPITAL_COMMUNITY): Payer: Self-pay | Admitting: Orthopedic Surgery

## 2018-01-15 DIAGNOSIS — T368X5A Adverse effect of other systemic antibiotics, initial encounter: Secondary | ICD-10-CM

## 2018-01-15 DIAGNOSIS — Z978 Presence of other specified devices: Secondary | ICD-10-CM

## 2018-01-15 DIAGNOSIS — L270 Generalized skin eruption due to drugs and medicaments taken internally: Secondary | ICD-10-CM

## 2018-01-15 DIAGNOSIS — Z5181 Encounter for therapeutic drug level monitoring: Secondary | ICD-10-CM

## 2018-01-15 LAB — CBC
HCT: 35.4 % — ABNORMAL LOW (ref 36.0–46.0)
Hemoglobin: 11.4 g/dL — ABNORMAL LOW (ref 12.0–15.0)
MCH: 28.6 pg (ref 26.0–34.0)
MCHC: 32.2 g/dL (ref 30.0–36.0)
MCV: 88.7 fL (ref 78.0–100.0)
PLATELETS: 256 10*3/uL (ref 150–400)
RBC: 3.99 MIL/uL (ref 3.87–5.11)
RDW: 12.8 % (ref 11.5–15.5)
WBC: 7.7 10*3/uL (ref 4.0–10.5)

## 2018-01-15 LAB — BASIC METABOLIC PANEL
ANION GAP: 9 (ref 5–15)
BUN: <5 mg/dL — ABNORMAL LOW (ref 6–20)
CALCIUM: 8.5 mg/dL — AB (ref 8.9–10.3)
CO2: 25 mmol/L (ref 22–32)
CREATININE: 0.61 mg/dL (ref 0.44–1.00)
Chloride: 106 mmol/L (ref 98–111)
GFR calc Af Amer: >60 mL/min (ref >60)
GLUCOSE: 84 mg/dL (ref 70–99)
Potassium: 4.2 mmol/L (ref 3.5–5.1)
Sodium: 140 mmol/L (ref 135–145)

## 2018-01-15 LAB — GRAM STAIN: Gram Stain: NONE SEEN

## 2018-01-15 LAB — HIV ANTIBODY (ROUTINE TESTING W REFLEX): HIV Screen 4th Generation wRfx: NONREACTIVE

## 2018-01-15 MED ORDER — HYDROCODONE-ACETAMINOPHEN 5-325 MG PO TABS
1.0000 | ORAL_TABLET | ORAL | Status: DC | PRN
  Administered 2018-01-15 – 2018-01-20 (×18): 1 via ORAL
  Filled 2018-01-15 (×19): qty 1

## 2018-01-15 MED ORDER — DOXYCYCLINE HYCLATE 100 MG PO TABS
100.0000 mg | ORAL_TABLET | Freq: Two times a day (BID) | ORAL | Status: DC
Start: 2018-01-15 — End: 2018-01-19
  Administered 2018-01-15 – 2018-01-19 (×9): 100 mg via ORAL
  Filled 2018-01-15 (×9): qty 1

## 2018-01-15 NOTE — Progress Notes (Signed)
    [facility]   Reason for visit: Follow up on cellulitis  Interval History: went to the OR for debridement yesterday, VAC now in place.  OP report reviewed and deep cultures obtained, no issues with the joint. Developed rash to vancomycin.    Physical Exam: Constitutional:  Vitals:   01/14/18 2355 01/15/18 0511  BP: (!) 125/96 118/78  Pulse: 84 86  Resp:    Temp: 99.4 F (37.4 C) 98.4 F (36.9 C)  SpO2: 99% 100%   patient appears in NAD Respiratory: Normal respiratory effort; CTA B Cardiovascular: RRR GI: soft, nt, nd MS: right foot with VAC in place  Review of Systems: Constitutional: negative for fevers and chills Gastrointestinal: negative for nausea and diarrhea Integument/breast: negative for rash  Lab Results  Component Value Date   WBC 7.7 01/15/2018   HGB 11.4 (L) 01/15/2018   HCT 35.4 (L) 01/15/2018   MCV 88.7 01/15/2018   PLT 256 01/15/2018    Lab Results  Component Value Date   CREATININE 0.61 01/15/2018   BUN <5 (L) 01/15/2018   NA 140 01/15/2018   K 4.2 01/15/2018   CL 106 01/15/2018   CO2 25 01/15/2018    Lab Results  Component Value Date   ALT 11 01/13/2018   AST 18 01/13/2018   ALKPHOS 62 01/13/2018     Microbiology: Recent Results (from the past 240 hour(s))  MRSA PCR Screening     Status: None   Collection Time: 01/14/18  3:51 PM  Result Value Ref Range Status   MRSA by PCR NEGATIVE NEGATIVE Final    Comment:        The GeneXpert MRSA Assay (FDA approved for NASAL specimens only), is one component of a comprehensive MRSA colonization surveillance program. It is not intended to diagnose MRSA infection nor to guide or monitor treatment for MRSA infections. Performed at [facility], [address] [place], [place] [address]   Gram stain     Status: None   Collection Time: 01/14/18  9:30 PM  Result Value Ref Range Status   Specimen Description TISSUE  Final   Special Requests NONE  Final   Gram Stain   Final    NO WBC SEEN NO ORGANISMS SEEN Performed at [facility], [address] [place], [place] [address]    Report Status 01/15/2018 FINAL  Final    Impression/Plan:  1. Wound infection with cellulitis - s/p debridement.  On cefazolin and monitoring cultures.  Hopefully will have some growth to guide treatment.  Will use doxycyline orally in the meantime as well.   2.  Medication monitoring - will monitor WBC, creat  3. Leukocytosis - resolved now.
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
## 2018-01-15 NOTE — Progress Notes (Signed)
Patient home birth control pills (Junet Fe) has been verified with pharmacy. Patient is allowed to take home medication while in hospital due to hospital does not carry medication. Medication was verified in the room with Rasheen RN. I witnessed patient took her morning pill.

## 2018-01-15 NOTE — Progress Notes (Signed)
Orthopedics Progress Note  Subjective: No complaints this AM  Objective:  Vitals:   01/14/18 2355 01/15/18 0511  BP: (!) 125/96 118/78  Pulse: 84 86  Resp:    Temp: 99.4 F (37.4 C) 98.4 F (36.9 C)  SpO2: 99% 100%    General: Awake and alert  Musculoskeletal: Right foot wound vac in place and functioning. Swelling stable and no ascending erythema. No cords Neurovascularly intact  Lab Results  Component Value Date   WBC 7.7 01/15/2018   HGB 11.4 (L) 01/15/2018   HCT 35.4 (L) 01/15/2018   MCV 88.7 01/15/2018   PLT 256 01/15/2018       Component Value Date/Time   NA 140 01/15/2018 0439   K 4.2 01/15/2018 0439   CL 106 01/15/2018 0439   CO2 25 01/15/2018 0439   GLUCOSE 84 01/15/2018 0439   BUN <5 (L) 01/15/2018 0439   CREATININE 0.61 01/15/2018 0439   CALCIUM 8.5 (L) 01/15/2018 0439   GFRNONAA >60 01/15/2018 0439   GFRAA >60 01/15/2018 0439    Lab Results  Component Value Date   INR 1.35 03/19/2015   INR 1.25 03/16/2015   INR 1.12 03/16/2015    Assessment/Plan: POD #1 s/p Procedure(s): IRRIGATION AND DEBRIDEMENT FOOT No evidence of joint involvement at surgery, 5th MTP capsule intact, Deep cultures obtained Placement of Wound Vac.  Cultures pending. Consider ID support Continue empiric IV abx Elevate foot  Almedia BallsSteven R. Ranell PatrickNorris, MD 01/15/2018 7:35 AM
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
## 2018-01-16 DIAGNOSIS — L0390 Cellulitis, unspecified: Secondary | ICD-10-CM

## 2018-01-16 NOTE — Progress Notes (Signed)
Orthopedics Progress Note  Subjective: No complaints this AM.   Objective:  Vitals:   01/15/18 2015 01/16/18 0555  BP: 133/84 109/61  Pulse: 82 77  Resp: 18 18  Temp: 98.5 F (36.9 C) 98.3 F (36.8 C)  SpO2: 98% 99%    General: Awake and alert  Musculoskeletal: Right foot elevated and wound vac in place. Minimal swelling and no ascending erythema Neurovascularly intact  Lab Results  Component Value Date   WBC 7.7 01/15/2018   HGB 11.4 (L) 01/15/2018   HCT 35.4 (L) 01/15/2018   MCV 88.7 01/15/2018   PLT 256 01/15/2018       Component Value Date/Time   NA 140 01/15/2018 0439   K 4.2 01/15/2018 0439   CL 106 01/15/2018 0439   CO2 25 01/15/2018 0439   GLUCOSE 84 01/15/2018 0439   BUN <5 (L) 01/15/2018 0439   CREATININE 0.61 01/15/2018 0439   CALCIUM 8.5 (L) 01/15/2018 0439   GFRNONAA >60 01/15/2018 0439   GFRAA >60 01/15/2018 0439    Lab Results  Component Value Date   INR 1.35 03/19/2015   INR 1.25 03/16/2015   INR 1.12 03/16/2015    Assessment/Plan: POD #2 s/p Procedure(s): IRRIGATION AND DEBRIDEMENT FOOT Improving clinically. Continue VAC and abx.   Will discuss with Dr Lajoyce Cornersuda when he returns with definitive would closure plan on Monday  Almedia BallsSteven R. Ranell PatrickNorris, MD 01/16/2018 10:16 AM

## 2018-01-16 NOTE — Progress Notes (Signed)
    [facility]   Reason for visit: Follow up on cellulitis  Interval History: no new events, afebrile  Physical Exam: Constitutional:  Vitals:   01/15/18 2015 01/16/18 0555  BP: 133/84 109/61  Pulse: 82 77  Resp: 18 18  Temp: 98.5 F (36.9 C) 98.3 F (36.8 C)  SpO2: 98% 99%   patient appears in NAD  Impression: Stable cellulitis, wound infection  Plan: 1.  Will continue to monitor cultures Continue cefazolin and doxycycline I will follow up on Monday, Dr. Ninetta LightsHatcher will monitor cultures over the weekend and adjust as indicated.

## 2018-01-16 NOTE — Plan of Care (Signed)
  Problem: Clinical Measurements: Goal: Will remain free from infection Outcome: Progressing   Problem: Pain Managment: Goal: General experience of comfort will improve Outcome: Progressing   
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
## 2018-01-17 ENCOUNTER — Encounter (HOSPITAL_COMMUNITY): Payer: Self-pay | Admitting: Orthopedic Surgery

## 2018-01-17 LAB — FUNGUS STAIN

## 2018-01-17 LAB — FUNGAL STAIN REFLEX

## 2018-01-17 NOTE — Anesthesia Postprocedure Evaluation (Signed)
Anesthesia Post Note  Patient: Angela BienenstockSarah I [name]  Procedure(s) Performed: IRRIGATION AND DEBRIDEMENT FOOT (Right Foot)     Patient location during evaluation: PACU Anesthesia Type: General Level of consciousness: awake and alert Pain management: pain level controlled Vital Signs Assessment: post-procedure vital signs reviewed and stable Respiratory status: spontaneous breathing, nonlabored ventilation, respiratory function stable and patient connected to nasal cannula oxygen Cardiovascular status: blood pressure returned to baseline and stable Postop Assessment: no apparent nausea or vomiting Anesthetic complications: no    Last Vitals:  Vitals:   01/16/18 2233 01/17/18 0300  BP: 123/78 119/61  Pulse: (!) 112 97  Resp:    Temp: 37.1 C 37.1 C  SpO2: 98% 100%    Last Pain:  Vitals:   01/17/18 0638  TempSrc:   PainSc: 0-No pain                 Claborn Janusz

## 2018-01-17 NOTE — Plan of Care (Signed)
  Problem: Activity: Goal: Risk for activity intolerance will decrease Outcome: Progressing   Problem: Nutrition: Goal: Adequate nutrition will be maintained Outcome: Progressing   Problem: Coping: Goal: Level of anxiety will decrease Outcome: Progressing   Problem: Elimination: Goal: Will not experience complications related to bowel motility Outcome: Progressing   Problem: Pain Managment: Goal: General experience of comfort will improve Outcome: Progressing   Problem: Safety: Goal: Ability to remain free from injury will improve Outcome: Progressing   Problem: Skin Integrity: Goal: Skin integrity will improve Outcome: Progressing

## 2018-01-17 NOTE — Anesthesia Preprocedure Evaluation (Signed)
Anesthesia Evaluation  Patient identified by MRN, date of birth, ID band Patient awake    Reviewed: Allergy & Precautions, NPO status   History of Anesthesia Complications Negative for: history of anesthetic complications  Airway Mallampati: II  TM Distance: >3 FB Neck ROM: Full    Dental  (+) Teeth Intact   Pulmonary neg pulmonary ROS,    breath sounds clear to auscultation       Cardiovascular negative cardio ROS   Rhythm:Regular Rate:Tachycardia     Neuro/Psych  Headaches, PSYCHIATRIC DISORDERS Anxiety Depression    GI/Hepatic negative GI ROS, Neg liver ROS,   Endo/Other  negative endocrine ROS  Renal/GU Renal disease     Musculoskeletal  (+) Arthritis ,   Abdominal   Peds  Hematology  (+) anemia ,   Anesthesia Other Findings   Reproductive/Obstetrics                             Anesthesia Physical Anesthesia Plan  ASA: II  Anesthesia Plan: General   Post-op Pain Management:    Induction: Intravenous  PONV Risk Score and Plan: 3 and Ondansetron and Dexamethasone  Airway Management Planned: Oral ETT and LMA  Additional Equipment: None  Intra-op Plan:   Post-operative Plan: Extubation in OR  Informed Consent: I have reviewed the patients History and Physical, chart, labs and discussed the procedure including the risks, benefits and alternatives for the proposed anesthesia with the patient or authorized representative who has indicated his/her understanding and acceptance.   Dental advisory given  Plan Discussed with: CRNA and Surgeon  Anesthesia Plan Comments:         Anesthesia Quick Evaluation
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
## 2018-01-17 NOTE — Progress Notes (Signed)
Subjective: 3 Days Post-Op Procedure(s) (LRB): IRRIGATION AND DEBRIDEMENT FOOT (Right) Patient reports pain as 2 on 0-10 scale.    Objective: Vital signs in last 24 hours: Temp:  [98.2 F (36.8 C)-98.8 F (37.1 C)] 98.7 F (37.1 C) (07/20 0300) Pulse Rate:  [77-112] 97 (07/20 0300) Resp:  [16] 16 (07/19 1300) BP: (119-125)/(61-78) 119/61 (07/20 0300) SpO2:  [98 %-100 %] 100 % (07/20 0300)  Intake/Output from previous day: 07/19 0701 - 07/20 0700 In: 360 [P.O.:360] Out: -  Intake/Output this shift: No intake/output data recorded.  Recent Labs    01/15/18 0439  HGB 11.4*   Recent Labs    01/15/18 0439  WBC 7.7  RBC 3.99  HCT 35.4*  PLT 256   Recent Labs    01/15/18 0439  NA 140  K 4.2  CL 106  CO2 25  BUN <5*  CREATININE 0.61  GLUCOSE 84  CALCIUM 8.5*   No results for input(s): LABPT, INR in the last 72 hours.  Incision: dressing C/D/I No cellulitis present Compartment soft  VAC intact and functioning.  Assessment/Plan: 3 Days Post-Op Procedure(s) (LRB): IRRIGATION AND DEBRIDEMENT FOOT (Right) Continue VAC, AB Doing well. Dr. Ranell PatrickNorris to discuss with Dr. Lajoyce Cornersuda on Monday    Irania Durell C 01/17/2018, 8:10 AM

## 2018-01-18 LAB — CBC
HEMATOCRIT: 39.7 % (ref 36.0–46.0)
Hemoglobin: 12.6 g/dL (ref 12.0–15.0)
MCH: 28 pg (ref 26.0–34.0)
MCHC: 31.7 g/dL (ref 30.0–36.0)
MCV: 88.2 fL (ref 78.0–100.0)
PLATELETS: 295 10*3/uL (ref 150–400)
RBC: 4.5 MIL/uL (ref 3.87–5.11)
RDW: 12.5 % (ref 11.5–15.5)
WBC: 6.5 10*3/uL (ref 4.0–10.5)

## 2018-01-18 NOTE — Plan of Care (Signed)
  Problem: Skin Integrity: Goal: Skin integrity will improve Outcome: Progressing   
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
## 2018-01-19 ENCOUNTER — Other Ambulatory Visit (INDEPENDENT_AMBULATORY_CARE_PROVIDER_SITE_OTHER): Payer: Self-pay | Admitting: Orthopedic Surgery

## 2018-01-19 DIAGNOSIS — L03115 Cellulitis of right lower limb: Secondary | ICD-10-CM

## 2018-01-19 DIAGNOSIS — L97511 Non-pressure chronic ulcer of other part of right foot limited to breakdown of skin: Secondary | ICD-10-CM

## 2018-01-19 DIAGNOSIS — M009 Pyogenic arthritis, unspecified: Secondary | ICD-10-CM

## 2018-01-19 DIAGNOSIS — A419 Sepsis, unspecified organism: Principal | ICD-10-CM

## 2018-01-19 LAB — BASIC METABOLIC PANEL
ANION GAP: 10 (ref 5–15)
BUN: 11 mg/dL (ref 6–20)
CHLORIDE: 105 mmol/L (ref 98–111)
CO2: 23 mmol/L (ref 22–32)
Calcium: 9.2 mg/dL (ref 8.9–10.3)
Creatinine, Ser: 0.57 mg/dL (ref 0.44–1.00)
GFR calc Af Amer: >60 mL/min (ref >60)
GLUCOSE: 95 mg/dL (ref 70–99)
POTASSIUM: 4.1 mmol/L (ref 3.5–5.1)
Sodium: 138 mmol/L (ref 135–145)

## 2018-01-19 LAB — CULTURE, BLOOD (ROUTINE X 2)
CULTURE: NO GROWTH
CULTURE: NO GROWTH
SPECIAL REQUESTS: ADEQUATE
Special Requests: ADEQUATE

## 2018-01-19 MED ORDER — LIP MEDEX EX OINT
TOPICAL_OINTMENT | CUTANEOUS | Status: DC | PRN
  Filled 2018-01-19: qty 7
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
## 2018-01-19 NOTE — Progress Notes (Signed)
Orthopedics Progress Note  Subjective: No pain this morning  Objective:  Vitals:   01/18/18 1940 01/19/18 0456  BP: 108/64 (!) 104/58  Pulse: 88 73  Resp:    Temp: 98.6 F (37 C) 98 F (36.7 C)  SpO2: 97% 99%    General: Awake and alert  Musculoskeletal: Minimal swelling, VAC in place Neurovascularly intact  Lab Results  Component Value Date   WBC 6.5 01/18/2018   HGB 12.6 01/18/2018   HCT 39.7 01/18/2018   MCV 88.2 01/18/2018   PLT 295 01/18/2018       Component Value Date/Time   NA 138 01/19/2018 0415   K 4.1 01/19/2018 0415   CL 105 01/19/2018 0415   CO2 23 01/19/2018 0415   GLUCOSE 95 01/19/2018 0415   BUN 11 01/19/2018 0415   CREATININE 0.57 01/19/2018 0415   CALCIUM 9.2 01/19/2018 0415   GFRNONAA >60 01/19/2018 0415   GFRAA >60 01/19/2018 0415    Lab Results  Component Value Date   INR 1.35 03/19/2015   INR 1.25 03/16/2015   INR 1.12 03/16/2015    Assessment/Plan: S/p IRRIGATION AND DEBRIDEMENT FOOT Dr Lajoyce Cornersuda plans for a repeat washout and closure on Wednesday Appreciate his expertise in this case.   Continue elevation and antibiotics  Almedia BallsSteven R. Ranell PatrickNorris, MD 01/19/2018 7:06 AM
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
## 2018-01-19 NOTE — Plan of Care (Signed)
  Problem: Safety: Goal: Ability to remain free from injury will improve Outcome: Progressing   Problem: Clinical Measurements: Goal: Ability to avoid or minimize complications of infection will improve Outcome: Progressing   

## 2018-01-20 DIAGNOSIS — T8149XD Infection following a procedure, other surgical site, subsequent encounter: Secondary | ICD-10-CM

## 2018-01-20 DIAGNOSIS — B9561 Methicillin susceptible Staphylococcus aureus infection as the cause of diseases classified elsewhere: Secondary | ICD-10-CM

## 2018-01-20 DIAGNOSIS — M00079 Staphylococcal arthritis, unspecified ankle and foot: Secondary | ICD-10-CM

## 2018-01-20 MED ORDER — DIPHENHYDRAMINE HCL 25 MG PO CAPS
25.0000 mg | ORAL_CAPSULE | Freq: Four times a day (QID) | ORAL | Status: DC | PRN
  Administered 2018-01-20 – 2018-01-22 (×4): 25 mg via ORAL
  Filled 2018-01-20 (×4): qty 1
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
## 2018-01-20 NOTE — Progress Notes (Signed)
[facility]fusion Coordinator will follow pt with ID to support Home IV ABX at DC if ordered.  If patient discharges after hours, please call [phone].   Sedalia Mutaamela S Chandler 01/20/2018, 8:17 AM

## 2018-01-20 NOTE — Progress Notes (Signed)
PHARMACIST - PHYSICIAN COMMUNICATION  DR:   Blake DivineAkula CONCERNING: IV to Oral Route Change Policy  RECOMMENDATION: This patient is receiving diphenhydramine by the intravenous route.  Based on criteria approved by the Pharmacy and Therapeutics Committee, intravenous diphenhydramine is being converted to the equivalent oral dose form(s).   DESCRIPTION: These criteria include:  Diphenhydramine is not prescribed to treat or prevent a severe allergic reaction  Diphenhydramine is not prescribed as premedication prior to receiving blood product, biologic medication, antimicrobial, or chemotherapy agent  The patient has tolerated at least one dose of an oral or enteral medication  The patient has no evidence of active gastrointestinal bleeding or impaired GI absorption (gastrectomy, short bowel, patient on TNA or NPO).  The patient is not undergoing procedural sedation   If you have questions about this conversion, please contact the Pharmacy Department  []   [phone]( [phone] )  [facility] []   [phone]( [phone] )  [facility] [x]   [phone]( [phone] )  [facility] []   [phone]  [facility] []   [phone]( [phone] )  [facility]

## 2018-01-20 NOTE — Progress Notes (Addendum)
[facility]   Reason for visit: Follow up on foot wound  Interval History: went to the OR Monday, VAC now in place; plan for skin graft tomorrow; no fever, normal WBC, continues on cefazolin day 8; doxycycline stopped with growth of MSSA in culture   Physical Exam: Constitutional:  Vitals:   01/19/18 2124 01/20/18 0514  BP: 112/69 112/69  Pulse: 90 72  Resp: 16 20  Temp: 98.9 F (37.2 C) 97.9 F (36.6 C)  SpO2: 97% 100%   patient appears in NAD Eyes: anicteric HENT: no thrush Respiratory: Normal respiratory effort; CTA B Cardiovascular: RRR MS: VAC in place, no erythema  Review of Systems: Constitutional: negative for fevers and chills Gastrointestinal: negative for diarrhea Integument/breast: negative for rash  Lab Results  Component Value Date   WBC 6.5 01/18/2018   HGB 12.6 01/18/2018   HCT 39.7 01/18/2018   MCV 88.2 01/18/2018   PLT 295 01/18/2018    Lab Results  Component Value Date   CREATININE 0.57 01/19/2018   BUN 11 01/19/2018   NA 138 01/19/2018   K 4.1 01/19/2018   CL 105 01/19/2018   CO2 23 01/19/2018    Lab Results  Component Value Date   ALT 11 01/13/2018   AST 18 01/13/2018   ALKPHOS 62 01/13/2018     Microbiology: Recent Results (from the past 240 hour(s))  Culture, blood (routine x 2)     Status: None   Collection Time: 01/14/18  2:30 AM  Result Value Ref Range Status   Specimen Description BLOOD RIGHT ANTECUBITAL  Final   Special Requests   Final    BOTTLES DRAWN AEROBIC AND ANAEROBIC Blood Culture adequate volume   Culture   Final    NO GROWTH 5 DAYS Performed at [facility], [address] [place], [place] [address]    Report Status 01/19/2018 FINAL  Final  Culture, blood (routine x 2)     Status: None   Collection Time: 01/14/18  2:44 AM  Result Value Ref Range Status   Specimen Description BLOOD LEFT HAND  Final   Special Requests   Final    BOTTLES DRAWN AEROBIC AND ANAEROBIC Blood  Culture adequate volume   Culture   Final    NO GROWTH 5 DAYS Performed at [facility], [address] [place], [place] [address]    Report Status 01/19/2018 FINAL  Final  MRSA PCR Screening     Status: None   Collection Time: 01/14/18  3:51 PM  Result Value Ref Range Status   MRSA by PCR NEGATIVE NEGATIVE Final    Comment:        The GeneXpert MRSA Assay (FDA approved for NASAL specimens only), is one component of a comprehensive MRSA colonization surveillance program. It is not intended to diagnose MRSA infection nor to guide or monitor treatment for MRSA infections. Performed at [facility], [address] [place], [place] [address]   Gram stain     Status: None   Collection Time: 01/14/18  9:30 PM  Result Value Ref Range Status   Specimen Description TISSUE  Final   Special Requests NONE  Final   Gram Stain   Final    NO WBC SEEN NO ORGANISMS SEEN Performed at [facility], [address] [place], [place] [address]    Report Status 01/15/2018 FINAL  Final  Aerobic/Anaerobic Culture (surgical/deep wound)     Status: None (Preliminary  result)   Collection Time: 01/14/18  9:30 PM  Result Value Ref Range Status   Specimen Description TISSUE  Final   Special Requests NONE  Final   Gram Stain   Final    RARE WBC PRESENT, PREDOMINANTLY PMN RARE GRAM POSITIVE COCCI Performed at [facility], [address], [place], [place] [address]    Culture   Final    RARE STAPHYLOCOCCUS AUREUS RARE STREPTOCOCCUS GROUP C CRITICAL RESULT CALLED TO, READ BACK BY AND VERIFIED WITH: R. RAWLINGS RN, AT 1049 01/19/18 BY D.VANHOOK REGARDING CULTURE GROWTH RARE GRAM NEGATIVE RODS    Report Status PENDING  Incomplete   Organism ID, Bacteria STAPHYLOCOCCUS AUREUS  Final      Susceptibility   Staphylococcus aureus - MIC*    CIPROFLOXACIN <=0.5 SENSITIVE Sensitive     ERYTHROMYCIN >=8 RESISTANT Resistant     GENTAMICIN <=0.5 SENSITIVE Sensitive     OXACILLIN  <=0.25 SENSITIVE Sensitive     TETRACYCLINE <=1 SENSITIVE Sensitive     VANCOMYCIN <=0.5 SENSITIVE Sensitive     TRIMETH/SULFA <=10 SENSITIVE Sensitive     CLINDAMYCIN RESISTANT Resistant     RIFAMPIN <=0.5 SENSITIVE Sensitive     Inducible Clindamycin POSITIVE Resistant     * RARE STAPHYLOCOCCUS AUREUS  Fungus Stain     Status: None   Collection Time: 01/14/18  9:30 PM  Result Value Ref Range Status   FUNGUS STAIN Final report  Final    Comment: (NOTE) Performed At: [facility] [address] [place], [place] [id number] Jolene Schimke MD XB:[id number]    Fungal Source TISSUE  Final    Comment: Performed at [facility], [address] [place], [place] [address]  Fungal Stain reflex     Status: None   Collection Time: 01/14/18  9:30 PM  Result Value Ref Range Status   Fungal stain result 1 Comment  Final    Comment: (NOTE) KOH/Calcofluor preparation:  no fungus observed. Performed At: [facility] [address] [place], [place] [id number] Jolene Schimke MD MW:[id number]     Impression/Plan:  1. Wound infection/cellulitis/MTP septic arthritis - growth with MSSA.  On cefazolin.  Can use oral Keflex if she loses access.   Will go home on oral Keflex.  2.  Wound - plan for skin graft tomorrow with Dr. Lajoyce Corners  ADDENDUM: will consider oral linezolid at discharge if she is able to stop the amitriptyline for sleep during treatment.  This will give better bioavailability for the septic arthritis.

## 2018-01-21 ENCOUNTER — Inpatient Hospital Stay (HOSPITAL_COMMUNITY): Payer: BLUE CROSS/BLUE SHIELD | Admitting: Certified Registered Nurse Anesthetist

## 2018-01-21 ENCOUNTER — Encounter (HOSPITAL_COMMUNITY): Payer: Self-pay | Admitting: Certified Registered"

## 2018-01-21 ENCOUNTER — Ambulatory Visit: Payer: BLUE CROSS/BLUE SHIELD | Admitting: Internal Medicine

## 2018-01-21 ENCOUNTER — Encounter (HOSPITAL_COMMUNITY)
Admission: EM | Disposition: A | Discharge status: Home / Self Care | Payer: Self-pay | Source: Home / Self Care | Attending: Internal Medicine

## 2018-01-21 DIAGNOSIS — L089 Local infection of the skin and subcutaneous tissue, unspecified: Secondary | ICD-10-CM

## 2018-01-21 DIAGNOSIS — F419 Anxiety disorder, unspecified: Secondary | ICD-10-CM

## 2018-01-21 DIAGNOSIS — F329 Major depressive disorder, single episode, unspecified: Secondary | ICD-10-CM

## 2018-01-21 DIAGNOSIS — L03115 Cellulitis of right lower limb: Secondary | ICD-10-CM

## 2018-01-21 DIAGNOSIS — M0000 Staphylococcal arthritis, unspecified joint: Secondary | ICD-10-CM

## 2018-01-21 HISTORY — PX: I & D EXTREMITY: SHX5045

## 2018-01-21 LAB — AEROBIC/ANAEROBIC CULTURE W GRAM STAIN (SURGICAL/DEEP WOUND)

## 2018-01-21 LAB — AEROBIC/ANAEROBIC CULTURE (SURGICAL/DEEP WOUND)

## 2018-01-21 SURGERY — IRRIGATION AND DEBRIDEMENT EXTREMITY
Anesthesia: General | Laterality: Right

## 2018-01-21 MED ORDER — ACETAMINOPHEN 325 MG PO TABS: 325.0000 mg | ORAL_TABLET | ORAL | Status: DC | PRN

## 2018-01-21 MED ORDER — PROPOFOL 10 MG/ML IV BOLUS
INTRAVENOUS | Status: DC | PRN
  Administered 2018-01-21: 200 mg via INTRAVENOUS

## 2018-01-21 MED ORDER — LIDOCAINE 2% (20 MG/ML) 5 ML SYRINGE
INTRAMUSCULAR | Status: DC | PRN
  Administered 2018-01-21: 60 mg via INTRAVENOUS

## 2018-01-21 MED ORDER — MEPERIDINE HCL 50 MG/ML IJ SOLN: 6.2500 mg | INTRAMUSCULAR | Status: DC | PRN

## 2018-01-21 MED ORDER — DIPHENHYDRAMINE HCL 50 MG/ML IJ SOLN
INTRAMUSCULAR | Status: DC | PRN
  Administered 2018-01-21: 6.25 mg via INTRAVENOUS

## 2018-01-21 MED ORDER — LINEZOLID 600 MG PO TABS: 600.0000 mg | ORAL_TABLET | Freq: Two times a day (BID) | ORAL | 0 refills | Status: DC

## 2018-01-21 MED ORDER — FENTANYL CITRATE (PF) 100 MCG/2ML IJ SOLN: 25.0000 ug | INTRAMUSCULAR | Status: DC | PRN

## 2018-01-21 MED ORDER — METHOCARBAMOL 1000 MG/10ML IJ SOLN
500.0000 mg | Freq: Four times a day (QID) | INTRAVENOUS | Status: DC | PRN
  Filled 2018-01-21: qty 5

## 2018-01-21 MED ORDER — POLYETHYLENE GLYCOL 3350 17 G PO PACK: 17.0000 g | PACK | Freq: Every day | ORAL | Status: DC | PRN

## 2018-01-21 MED ORDER — BISACODYL 10 MG RE SUPP: 10.0000 mg | Freq: Every day | RECTAL | Status: DC | PRN

## 2018-01-21 MED ORDER — DOCUSATE SODIUM 100 MG PO CAPS: 100.0000 mg | ORAL_CAPSULE | Freq: Two times a day (BID) | ORAL | Status: DC

## 2018-01-21 MED ORDER — SODIUM CHLORIDE 0.9 % IR SOLN
Status: DC | PRN
  Administered 2018-01-21: 3000 mL

## 2018-01-21 MED ORDER — ACETAMINOPHEN 160 MG/5ML PO SOLN: 325.0000 mg | ORAL | Status: DC | PRN

## 2018-01-21 MED ORDER — MORPHINE SULFATE (PF) 2 MG/ML IV SOLN: 0.5000 mg | INTRAVENOUS | Status: DC | PRN

## 2018-01-21 MED ORDER — PROPOFOL 10 MG/ML IV BOLUS
INTRAVENOUS | Status: AC
  Filled 2018-01-21: qty 20

## 2018-01-21 MED ORDER — HYDROCODONE-ACETAMINOPHEN 7.5-325 MG PO TABS
1.0000 | ORAL_TABLET | ORAL | Status: DC | PRN
  Administered 2018-01-22 (×3): 2 via ORAL
  Filled 2018-01-21 (×3): qty 2

## 2018-01-21 MED ORDER — HYDROCODONE-ACETAMINOPHEN 5-325 MG PO TABS
1.0000 | ORAL_TABLET | ORAL | Status: DC | PRN
  Administered 2018-01-21 (×3): 2 via ORAL
  Filled 2018-01-21 (×3): qty 2

## 2018-01-21 MED ORDER — FENTANYL CITRATE (PF) 100 MCG/2ML IJ SOLN
INTRAMUSCULAR | Status: DC | PRN
  Administered 2018-01-21 (×2): 50 ug via INTRAVENOUS

## 2018-01-21 MED ORDER — ONDANSETRON HCL 4 MG/2ML IJ SOLN: 4.0000 mg | Freq: Four times a day (QID) | INTRAMUSCULAR | Status: DC | PRN

## 2018-01-21 MED ORDER — CEFAZOLIN SODIUM-DEXTROSE 2-3 GM-%(50ML) IV SOLR
INTRAVENOUS | Status: DC | PRN
  Administered 2018-01-21: 2 g via INTRAVENOUS

## 2018-01-21 MED ORDER — ONDANSETRON HCL 4 MG/2ML IJ SOLN
INTRAMUSCULAR | Status: DC | PRN
  Administered 2018-01-21: 4 mg via INTRAVENOUS

## 2018-01-21 MED ORDER — DEXAMETHASONE SODIUM PHOSPHATE 10 MG/ML IJ SOLN
INTRAMUSCULAR | Status: DC | PRN
  Administered 2018-01-21: 10 mg via INTRAVENOUS

## 2018-01-21 MED ORDER — OXYCODONE HCL 5 MG/5ML PO SOLN: 5.0000 mg | Freq: Once | ORAL | Status: DC | PRN

## 2018-01-21 MED ORDER — ONDANSETRON HCL 4 MG/2ML IJ SOLN
INTRAMUSCULAR | Status: AC
  Filled 2018-01-21: qty 2

## 2018-01-21 MED ORDER — DEXAMETHASONE SODIUM PHOSPHATE 10 MG/ML IJ SOLN
INTRAMUSCULAR | Status: AC
  Filled 2018-01-21: qty 1

## 2018-01-21 MED ORDER — LINEZOLID 600 MG PO TABS
600.0000 mg | ORAL_TABLET | Freq: Two times a day (BID) | ORAL | Status: DC
  Administered 2018-01-21 – 2018-01-22 (×2): 600 mg via ORAL
  Filled 2018-01-21 (×3): qty 1

## 2018-01-21 MED ORDER — OXYCODONE HCL 5 MG PO TABS: 5.0000 mg | ORAL_TABLET | Freq: Once | ORAL | Status: DC | PRN

## 2018-01-21 MED ORDER — ONDANSETRON HCL 4 MG/2ML IJ SOLN: 4.0000 mg | Freq: Once | INTRAMUSCULAR | Status: DC | PRN

## 2018-01-21 MED ORDER — MIDAZOLAM HCL 2 MG/2ML IJ SOLN
INTRAMUSCULAR | Status: DC | PRN
  Administered 2018-01-21: 2 mg via INTRAVENOUS

## 2018-01-21 MED ORDER — ONDANSETRON HCL 4 MG PO TABS: 4.0000 mg | ORAL_TABLET | Freq: Four times a day (QID) | ORAL | Status: DC | PRN

## 2018-01-21 MED ORDER — METHOCARBAMOL 500 MG PO TABS: 500.0000 mg | ORAL_TABLET | Freq: Four times a day (QID) | ORAL | Status: DC | PRN

## 2018-01-21 MED ORDER — CEFAZOLIN SODIUM-DEXTROSE 1-4 GM/50ML-% IV SOLN: 1.0000 g | Freq: Four times a day (QID) | INTRAVENOUS | Status: DC

## 2018-01-21 MED ORDER — LACTATED RINGERS IV SOLN
INTRAVENOUS | Status: DC
  Administered 2018-01-21: 08:00:00 via INTRAVENOUS

## 2018-01-21 MED ORDER — ACETAMINOPHEN 325 MG PO TABS: 325.0000 mg | ORAL_TABLET | Freq: Four times a day (QID) | ORAL | Status: DC | PRN

## 2018-01-21 MED ORDER — METOCLOPRAMIDE HCL 5 MG PO TABS: 5.0000 mg | ORAL_TABLET | Freq: Three times a day (TID) | ORAL | Status: DC | PRN

## 2018-01-21 MED ORDER — MAGNESIUM CITRATE PO SOLN: 1.0000 | Freq: Once | ORAL | Status: DC | PRN

## 2018-01-21 MED ORDER — MIDAZOLAM HCL 2 MG/2ML IJ SOLN
INTRAMUSCULAR | Status: AC
  Filled 2018-01-21: qty 2

## 2018-01-21 MED ORDER — SODIUM CHLORIDE 0.9 % IV SOLN
INTRAVENOUS | Status: DC
  Administered 2018-01-21: 14:00:00 via INTRAVENOUS

## 2018-01-21 MED ORDER — DIPHENHYDRAMINE HCL 50 MG/ML IJ SOLN
INTRAMUSCULAR | Status: AC
  Filled 2018-01-21: qty 1

## 2018-01-21 MED ORDER — METOCLOPRAMIDE HCL 5 MG/ML IJ SOLN: 5.0000 mg | Freq: Three times a day (TID) | INTRAMUSCULAR | Status: DC | PRN

## 2018-01-21 MED ORDER — FENTANYL CITRATE (PF) 250 MCG/5ML IJ SOLN
INTRAMUSCULAR | Status: AC
Start: 2018-01-21 — End: ?
  Filled 2018-01-21: qty 5

## 2018-01-21 SURGICAL SUPPLY — 48 items
BLADE SURG 21 STRL SS (BLADE) ×1 IMPLANT
BNDG COHESIVE 6X5 TAN STRL LF (GAUZE/BANDAGES/DRESSINGS) ×1 IMPLANT
BNDG ESMARK 4X9 LF (GAUZE/BANDAGES/DRESSINGS) ×1 IMPLANT
BNDG GAUZE ELAST 4 BULKY (GAUZE/BANDAGES/DRESSINGS) ×2 IMPLANT
BNDG GAUZE STRTCH 6 (GAUZE/BANDAGES/DRESSINGS) IMPLANT
COVER SURGICAL LIGHT HANDLE (MISCELLANEOUS) ×3 IMPLANT
CUFF TOURNIQUET SINGLE 18IN (TOURNIQUET CUFF) IMPLANT
CUFF TOURNIQUET SINGLE 24IN (TOURNIQUET CUFF) IMPLANT
DERMACARRIERS GRAFT 1 TO 1.5 (DISPOSABLE)
DRAPE U-SHAPE 47X51 STRL (DRAPES) ×2 IMPLANT
DRESSING PREVENA PLUS CUSTOM (GAUZE/BANDAGES/DRESSINGS) IMPLANT
DRSG ADAPTIC 3X8 NADH LF (GAUZE/BANDAGES/DRESSINGS) ×1 IMPLANT
DRSG MEPITEL 4X7.2 (GAUZE/BANDAGES/DRESSINGS) ×1 IMPLANT
DRSG PREVENA PLUS CUSTOM (GAUZE/BANDAGES/DRESSINGS) ×2
DURAPREP 26ML APPLICATOR (WOUND CARE) ×2 IMPLANT
ELECT REM PT RETURN 9FT ADLT (ELECTROSURGICAL) ×2
ELECTRODE REM PT RTRN 9FT ADLT (ELECTROSURGICAL) ×1 IMPLANT
GAUZE SPONGE 4X4 12PLY STRL (GAUZE/BANDAGES/DRESSINGS) ×1 IMPLANT
GLOVE BIOGEL PI IND STRL 9 (GLOVE) ×1 IMPLANT
GLOVE BIOGEL PI INDICATOR 9 (GLOVE) ×1
GLOVE SURG ORTHO 9.0 STRL STRW (GLOVE) ×2 IMPLANT
GOWN STRL REUS W/ TWL XL LVL3 (GOWN DISPOSABLE) ×2 IMPLANT
GOWN STRL REUS W/TWL XL LVL3 (GOWN DISPOSABLE) ×2
GRAFT DERMACARRIERS 1 TO 1.5 (DISPOSABLE) IMPLANT
HANDPIECE INTERPULSE COAX TIP (DISPOSABLE)
KIT BASIN OR (CUSTOM PROCEDURE TRAY) ×2 IMPLANT
KIT TURNOVER KIT B (KITS) ×2 IMPLANT
MANIFOLD NEPTUNE II (INSTRUMENTS) ×2 IMPLANT
NDL HYPO 25GX1X1/2 BEV (NEEDLE) IMPLANT
NEEDLE HYPO 25GX1X1/2 BEV (NEEDLE) IMPLANT
NS IRRIG 1000ML POUR BTL (IV SOLUTION) ×2 IMPLANT
PACK ORTHO EXTREMITY (CUSTOM PROCEDURE TRAY) ×2 IMPLANT
PAD ARMBOARD 7.5X6 YLW CONV (MISCELLANEOUS) ×4 IMPLANT
PAD CAST 4YDX4 CTTN HI CHSV (CAST SUPPLIES) IMPLANT
PADDING CAST COTTON 4X4 STRL (CAST SUPPLIES)
SET HNDPC FAN SPRY TIP SCT (DISPOSABLE) IMPLANT
STOCKINETTE IMPERVIOUS 9X36 MD (GAUZE/BANDAGES/DRESSINGS) IMPLANT
SUCTION FRAZIER HANDLE 10FR (MISCELLANEOUS)
SUCTION TUBE FRAZIER 10FR DISP (MISCELLANEOUS) IMPLANT
SUT ETHILON 4 0 PS 2 18 (SUTURE) IMPLANT
SWAB COLLECTION DEVICE MRSA (MISCELLANEOUS) ×2 IMPLANT
SWAB CULTURE ESWAB REG 1ML (MISCELLANEOUS) IMPLANT
SYR CONTROL 10ML LL (SYRINGE) IMPLANT
TOWEL OR 17X24 6PK STRL BLUE (TOWEL DISPOSABLE) ×2 IMPLANT
TOWEL OR 17X26 10 PK STRL BLUE (TOWEL DISPOSABLE) ×2 IMPLANT
TUBE CONNECTING 12X1/4 (SUCTIONS) ×2 IMPLANT
WATER STERILE IRR 1000ML POUR (IV SOLUTION) ×1 IMPLANT
YANKAUER SUCT BULB TIP NO VENT (SUCTIONS) ×2 IMPLANT
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
## 2018-01-21 NOTE — Anesthesia Preprocedure Evaluation (Deleted)
Anesthesia Evaluation  Patient identified by MRN, date of birth, ID band Patient awake    Reviewed: Allergy & Precautions, H&P , NPO status , Patient's Chart, lab work & pertinent test results, reviewed documented beta blocker date and time   Airway Mallampati: I  TM Distance: >3 FB Neck ROM: full    Dental no notable dental hx. (+) Teeth Intact Upper braces :   Pulmonary neg pulmonary ROS,    Pulmonary exam normal breath sounds clear to auscultation       Cardiovascular Exercise Tolerance: Good negative cardio ROS   Rhythm:regular Rate:Normal     Neuro/Psych PSYCHIATRIC DISORDERS Anxiety Depression negative neurological ROS  negative psych ROS   GI/Hepatic negative GI ROS, Neg liver ROS,   Endo/Other  negative endocrine ROS  Renal/GU Renal diseasenegative Renal ROS  negative genitourinary   Musculoskeletal   Abdominal   Peds  Hematology negative hematology ROS (+) anemia ,   Anesthesia Other Findings   Reproductive/Obstetrics negative OB ROS                                                              Anesthesia Evaluation  Patient identified by MRN, date of birth, ID band Patient awake    Reviewed: Allergy & Precautions, NPO status   History of Anesthesia Complications Negative for: history of anesthetic complications  Airway Mallampati: II  TM Distance: >3 FB Neck ROM: Full    Dental  (+) Teeth Intact   Pulmonary neg pulmonary ROS,    breath sounds clear to auscultation       Cardiovascular negative cardio ROS   Rhythm:Regular Rate:Tachycardia     Neuro/Psych  Headaches, PSYCHIATRIC DISORDERS Anxiety Depression    GI/Hepatic negative GI ROS, Neg liver ROS,   Endo/Other  negative endocrine ROS  Renal/GU Renal disease     Musculoskeletal  (+) Arthritis ,   Abdominal   Peds  Hematology  (+) anemia ,   Anesthesia Other Findings   Reproductive/Obstetrics                             Anesthesia Physical Anesthesia Plan  ASA: II  Anesthesia Plan: General   Post-op Pain Management:    Induction: Intravenous  PONV Risk Score and Plan: 3 and Ondansetron and Dexamethasone  Airway Management Planned: Oral ETT and LMA  Additional Equipment: None  Intra-op Plan:   Post-operative Plan: Extubation in OR  Informed Consent: I have reviewed the patients History and Physical, chart, labs and discussed the procedure including the risks, benefits and alternatives for the proposed anesthesia with the patient or authorized representative who has indicated his/her understanding and acceptance.   Dental advisory given  Plan Discussed with: CRNA and Surgeon  Anesthesia Plan Comments:         Anesthesia Quick Evaluation                                   Anesthesia Evaluation  Patient identified by MRN, date of birth, ID band Patient awake    Reviewed: Allergy & Precautions, NPO status   History of Anesthesia Complications Negative for: history of anesthetic complications  Airway Mallampati: II  TM  Distance: >3 FB Neck ROM: Full    Dental  (+) Teeth Intact   Pulmonary neg pulmonary ROS,    breath sounds clear to auscultation       Cardiovascular negative cardio ROS   Rhythm:Regular Rate:Tachycardia     Neuro/Psych  Headaches, PSYCHIATRIC DISORDERS Anxiety Depression    GI/Hepatic negative GI ROS, Neg liver ROS,   Endo/Other  negative endocrine ROS  Renal/GU Renal disease     Musculoskeletal  (+) Arthritis ,   Abdominal   Peds  Hematology  (+) anemia ,   Anesthesia Other Findings   Reproductive/Obstetrics                             Anesthesia Physical Anesthesia Plan  ASA: II  Anesthesia Plan: General   Post-op Pain Management:    Induction: Intravenous  PONV Risk Score and Plan: 3 and Ondansetron and  Dexamethasone  Airway Management Planned: Oral ETT and LMA  Additional Equipment: None  Intra-op Plan:   Post-operative Plan: Extubation in OR  Informed Consent: I have reviewed the patients History and Physical, chart, labs and discussed the procedure including the risks, benefits and alternatives for the proposed anesthesia with the patient or authorized representative who has indicated his/her understanding and acceptance.   Dental advisory given  Plan Discussed with: CRNA and Surgeon  Anesthesia Plan Comments:         Anesthesia Quick Evaluation                                   Anesthesia Evaluation  Patient identified by MRN, date of birth, ID band Patient awake    Reviewed: NPO status Preop documentation limited or incomplete due to emergent nature of procedure.  Airway Mallampati: II  TM Distance: >3 FB Neck ROM: Full    Dental  (+) Teeth Intact   Pulmonary neg pulmonary ROS,    breath sounds clear to auscultation       Cardiovascular negative cardio ROS   Rhythm:Regular Rate:Tachycardia     Neuro/Psych    GI/Hepatic   Endo/Other    Renal/GU      Musculoskeletal   Abdominal   Peds  Hematology   Anesthesia Other Findings   Reproductive/Obstetrics                             Lab Results  Component Value Date   WBC 6.5 01/18/2018   HGB 12.6 01/18/2018   HCT 39.7 01/18/2018   MCV 88.2 01/18/2018   PLT 295 01/18/2018   Lab Results  Component Value Date   CREATININE 0.57 01/19/2018   BUN 11 01/19/2018   NA 138 01/19/2018   K 4.1 01/19/2018   CL 105 01/19/2018   CO2 23 01/19/2018     Anesthesia Physical Anesthesia Plan  ASA: I and emergent  Anesthesia Plan: General   Post-op Pain Management:    Induction: Intravenous  Airway Management Planned: Oral ETT  Additional Equipment:   Intra-op Plan:   Post-operative Plan: Extubation in OR  Informed Consent: I have reviewed the  patients History and Physical, chart, labs and discussed the procedure including the risks, benefits and alternatives for the proposed anesthesia with the patient or authorized representative who has indicated his/her understanding and acceptance.   Only emergency history available  Plan Discussed with: CRNA  Anesthesia Plan Comments:         Anesthesia Quick Evaluation  Anesthesia Physical Anesthesia Plan  ASA: II  Anesthesia Plan: General   Post-op Pain Management:    Induction: Intravenous  PONV Risk Score and Plan: 3 and Ondansetron, Dexamethasone, Treatment may vary due to age or medical condition and Midazolam  Airway Management Planned: LMA  Additional Equipment:   Intra-op Plan:   Post-operative Plan: Extubation in OR  Informed Consent: I have reviewed the patients History and Physical, chart, labs and discussed the procedure including the risks, benefits and alternatives for the proposed anesthesia with the patient or authorized representative who has indicated his/her understanding and acceptance.   Dental Advisory Given  Plan Discussed with: CRNA, Anesthesiologist and Surgeon  Anesthesia Plan Comments: ( )        Anesthesia Quick Evaluation

## 2018-01-21 NOTE — Op Note (Signed)
01/21/2018  10:10 AM  PATIENT:  Angela [name]    PRE-OPERATIVE DIAGNOSIS:  wound right foot  POST-OPERATIVE DIAGNOSIS:  Same  PROCEDURE:  RIGHT FOOT DEBRIDEMENT, with excision of skin and soft tissue and bone from the fifth metatarsal with local tissue rearrangement for WOUND CLOSURE, application of Praveena wound VAC  SURGEON:  Nadara MustardMarcus V Khadeeja Elden, MD  PHYSICIAN ASSISTANT:None ANESTHESIA:   General  PREOPERATIVE INDICATIONS:  Angela [name] is a  17 y.o. female with a diagnosis of wound right foot who failed conservative measures and elected for surgical management.    The risks benefits and alternatives were discussed with the patient preoperatively including but not limited to the risks of infection, bleeding, nerve injury, cardiopulmonary complications, the need for revision surgery, among others, and the patient was willing to proceed.  OPERATIVE IMPLANTS: Praveena wound VAC  @ENCIMAGES @  OPERATIVE FINDINGS: Exposed fifth metatarsal head joint and capsule  OPERATIVE PROCEDURE: Patient was brought the operating room and underwent a general anesthetic.  After adequate levels of anesthesia were obtained patient's right lower extremity was prepped using DuraPrep draped in the sterile field a timeout was called.  A 10 blade knife was used to excise the nonviable edges of the skin and soft tissue.  This left a wound that was 7 x 4 cm.  There was exposed fifth metatarsal head and MTP joint.  A rondure was used to excise the distal lateral aspect of the exposed fifth metatarsal head this is debrided back to healthy viable tissue.  The skin was undermined locally.  After debridement all tissue was healthy and viable the wound was irrigated with pulsatile lavage.  Local tissue rearrangement was used for wound closure to swing the flap plantarly to close the plantar ulcer.  This was closed with 2-0 nylon local tissue rearrangement was then used to close the dorsal wound with bringing the flap of  tissue from the dorsal aspect of foot laterally.  This also was closed with 2-0 nylon.  There is no tension on the skin.  A Praveena wound VAC was applied this had a good suction fit.  Total wound measurements were 7 x 4 cm.  Patient was extubated taken the PACU in stable condition.   DISCHARGE PLANNING:  Antibiotic duration: 24 hours postoperatively  Weightbearing: Nonweightbearing on the right  Pain medication: Low-dose opioid pathway  Dressing care/ Wound VAC: Continue wound VAC at discharge for 1 week  Ambulatory devices: Patient has both a walker and a wheelchair  Discharge to: Home.  Follow-up: In the office 1 week post operative.

## 2018-01-21 NOTE — Anesthesia Postprocedure Evaluation (Signed)
Anesthesia Post Note  Patient: Angela BienenstockSarah I [name]  Procedure(s) Performed: RIGHT FOOT DEBRIDEMENT WOUND CLOSURE (Right )     Patient location during evaluation: PACU Anesthesia Type: General Level of consciousness: awake and alert Pain management: pain level controlled Vital Signs Assessment: post-procedure vital signs reviewed and stable Respiratory status: spontaneous breathing, nonlabored ventilation, respiratory function stable and patient connected to nasal cannula oxygen Cardiovascular status: blood pressure returned to baseline and stable Postop Assessment: no apparent nausea or vomiting Anesthetic complications: no    Last Vitals:  Vitals:   01/21/18 1035 01/21/18 1050  BP: 114/71 107/67  Pulse: 78 73  Resp: 12 15  Temp: 36.7 C   SpO2: 97% 96%    Last Pain:  Vitals:   01/21/18 1035  TempSrc:   PainSc: 0-No pain                 Keelin Neville

## 2018-01-21 NOTE — Progress Notes (Addendum)
[facility]   Reason for visit: Follow up on wound infection  Interval History: s/p graft by Dr. Lajoyce Cornersuda.  No new issues.  Afebrile.   Physical Exam: Constitutional:  Vitals:   01/21/18 1050 01/21/18 1445  BP: 107/67 (!) 103/56  Pulse: 73 84  Resp: 15   Temp:  98.2 F (36.8 C)  SpO2: 96% 99%   patient appears in NAD Respiratory: Normal respiratory effort; CTA B Cardiovascular: RRR GI: soft, nt, nd  Review of Systems: Constitutional: negative for fevers and chills Gastrointestinal: negative for diarrhea  Lab Results  Component Value Date   WBC 6.5 01/18/2018   HGB 12.6 01/18/2018   HCT 39.7 01/18/2018   MCV 88.2 01/18/2018   PLT 295 01/18/2018    Lab Results  Component Value Date   CREATININE 0.57 01/19/2018   BUN 11 01/19/2018   NA 138 01/19/2018   K 4.1 01/19/2018   CL 105 01/19/2018   CO2 23 01/19/2018    Lab Results  Component Value Date   ALT 11 01/13/2018   AST 18 01/13/2018   ALKPHOS 62 01/13/2018     Microbiology: Recent Results (from the past 240 hour(s))  Culture, blood (routine x 2)     Status: None   Collection Time: 01/14/18  2:30 AM  Result Value Ref Range Status   Specimen Description BLOOD RIGHT ANTECUBITAL  Final   Special Requests   Final    BOTTLES DRAWN AEROBIC AND ANAEROBIC Blood Culture adequate volume   Culture   Final    NO GROWTH 5 DAYS Performed at [facility], [address] [place], [place] [address]    Report Status 01/19/2018 FINAL  Final  Culture, blood (routine x 2)     Status: None   Collection Time: 01/14/18  2:44 AM  Result Value Ref Range Status   Specimen Description BLOOD LEFT HAND  Final   Special Requests   Final    BOTTLES DRAWN AEROBIC AND ANAEROBIC Blood Culture adequate volume   Culture   Final    NO GROWTH 5 DAYS Performed at [facility], [address] [place], [place] [address]    Report Status 01/19/2018 FINAL  Final  MRSA PCR Screening     Status: None     Collection Time: 01/14/18  3:51 PM  Result Value Ref Range Status   MRSA by PCR NEGATIVE NEGATIVE Final    Comment:        The GeneXpert MRSA Assay (FDA approved for NASAL specimens only), is one component of a comprehensive MRSA colonization surveillance program. It is not intended to diagnose MRSA infection nor to guide or monitor treatment for MRSA infections. Performed at [facility], [address] [place], [place] [address]   Gram stain     Status: None   Collection Time: 01/14/18  9:30 PM  Result Value Ref Range Status   Specimen Description TISSUE  Final   Special Requests NONE  Final   Gram Stain   Final    NO WBC SEEN NO ORGANISMS SEEN Performed at [facility], [address] [place], [place] [address]    Report Status 01/15/2018 FINAL  Final  Aerobic/Anaerobic Culture (surgical/deep wound)     Status: None   Collection Time: 01/14/18  9:30 PM  Result Value Ref Range Status   Specimen Description TISSUE  Final   Special Requests NONE  Final   Gram Stain  Final    RARE WBC PRESENT, PREDOMINANTLY PMN RARE GRAM POSITIVE COCCI    Culture   Final    RARE STAPHYLOCOCCUS AUREUS RARE STREPTOCOCCUS GROUP C CRITICAL RESULT CALLED TO, READ BACK BY AND VERIFIED WITH: R. RAWLINGS RN, AT 1049 01/19/18 BY D.VANHOOK REGARDING CULTURE GROWTH RARE PSEUDOMONAS AERUGINOSA NO ANAEROBES ISOLATED Performed at [facility], [address], [place], [place] [address]    Report Status 01/21/2018 FINAL  Final   Organism ID, Bacteria STAPHYLOCOCCUS AUREUS  Final   Organism ID, Bacteria PSEUDOMONAS AERUGINOSA  Final      Susceptibility   Pseudomonas aeruginosa - MIC*    CEFTAZIDIME 4 SENSITIVE Sensitive     CIPROFLOXACIN <=0.25 SENSITIVE Sensitive     GENTAMICIN <=1 SENSITIVE Sensitive     IMIPENEM >=16 RESISTANT Resistant     PIP/TAZO 8 SENSITIVE Sensitive     CEFEPIME 2 SENSITIVE Sensitive     * RARE PSEUDOMONAS AERUGINOSA   Staphylococcus aureus - MIC*     CIPROFLOXACIN <=0.5 SENSITIVE Sensitive     ERYTHROMYCIN >=8 RESISTANT Resistant     GENTAMICIN <=0.5 SENSITIVE Sensitive     OXACILLIN <=0.25 SENSITIVE Sensitive     TETRACYCLINE <=1 SENSITIVE Sensitive     VANCOMYCIN <=0.5 SENSITIVE Sensitive     TRIMETH/SULFA <=10 SENSITIVE Sensitive     CLINDAMYCIN RESISTANT Resistant     RIFAMPIN <=0.5 SENSITIVE Sensitive     Inducible Clindamycin POSITIVE Resistant     * RARE STAPHYLOCOCCUS AUREUS  Fungus Stain     Status: None   Collection Time: 01/14/18  9:30 PM  Result Value Ref Range Status   FUNGUS STAIN Final report  Final    Comment: (NOTE) Performed At: [facility] [address] [place], [place] [address] Jolene Schimke MD [id number]    Fungal Source TISSUE  Final    Comment: Performed at [facility], [address], [place], [place] [address]  Fungal Stain reflex     Status: None   Collection Time: 01/14/18  9:30 PM  Result Value Ref Range Status   Fungal stain result 1 Comment  Final    Comment: (NOTE) KOH/Calcofluor preparation:  no fungus observed. Performed At: [facility] [address] [place], [place] [address] Jolene Schimke MD [id number]     Impression/Plan:  1. Wound infection, septic arthritis - MSSA.  Will use linezolid for 2 weeks for good bioavailability for septic arthritis.  Have checked with pharmacy and will be covered. Please give her a 14 day prescription.    2. Sleep - she will hold Elavil during treatment with linezolid to avoid drug interactions.  I will arrange follow up in my office in about 10 days.   Otherwise I will sign off. Thanks for consultation.

## 2018-01-21 NOTE — Progress Notes (Signed)
Orthopedic Tech Progress Note Patient Details:  Angela [name] 05/03/2001 [id number]  Ortho Devices Type of Ortho Device: Postop shoe/boot Ortho Device/Splint Location: rle Ortho Device/Splint Interventions: Application   Post Interventions Patient Tolerated: Well Instructions Provided: Care of device   Nikki DomCrawford, Angela [name] 01/21/2018, 12:42 PM

## 2018-01-21 NOTE — Anesthesia Procedure Notes (Signed)
Procedure Name: LMA Insertion Date/Time: 01/21/2018 9:31 AM Performed by: Bethena Midgetddono, Ernest, MD Pre-anesthesia Checklist: Patient identified, Emergency Drugs available, Suction available and Patient being monitored Patient Re-evaluated:Patient Re-evaluated prior to induction Oxygen Delivery Method: Circle System Utilized Preoxygenation: Pre-oxygenation with 100% oxygen Induction Type: IV induction Ventilation: Mask ventilation without difficulty LMA: LMA inserted LMA Size: 4.0 Number of attempts: 1 Airway Equipment and Method: Bite block Placement Confirmation: positive ETCO2 Tube secured with: Tape Dental Injury: Teeth and Oropharynx as per pre-operative assessment  Comments: Burton ApleyBrooke Stoner, SRNA placed LMA

## 2018-01-21 NOTE — Transfer of Care (Signed)
Immediate Anesthesia Transfer of Care Note  Patient: Angela [name]  Procedure(s) Performed: RIGHT FOOT DEBRIDEMENT WOUND CLOSURE (Right )  Patient Location: PACU  Anesthesia Type:General  Level of Consciousness: awake and alert   Airway & Oxygen Therapy: Patient Spontanous Breathing and Patient connected to face mask oxygen  Post-op Assessment: Report given to RN and Patient moving all extremities X 4  Post vital signs: Reviewed and stable  Last Vitals:  Vitals Value Taken Time  BP    Temp    Pulse 92 01/21/2018 10:05 AM  Resp 15 01/21/2018 10:05 AM  SpO2 100 % 01/21/2018 10:05 AM  Vitals shown include unvalidated device data.  Last Pain:  Vitals:   01/21/18 0730  TempSrc:   PainSc: 0-No pain      Patients Stated Pain Goal: 0 (01/16/18 0711)  Complications: No apparent anesthesia complications

## 2018-01-21 NOTE — Anesthesia Preprocedure Evaluation (Signed)
Anesthesia Evaluation  Patient identified by MRN, date of birth, ID band Patient awake    Reviewed: Allergy & Precautions, NPO status   History of Anesthesia Complications Negative for: history of anesthetic complications  Airway Mallampati: I  TM Distance: >3 FB Neck ROM: Full    Dental  (+) Teeth Intact BRACES UPPER:   Pulmonary neg pulmonary ROS,    breath sounds clear to auscultation       Cardiovascular negative cardio ROS   Rhythm:Regular Rate:Tachycardia     Neuro/Psych  Headaches, PSYCHIATRIC DISORDERS Anxiety Depression    GI/Hepatic negative GI ROS, Neg liver ROS,   Endo/Other  negative endocrine ROS  Renal/GU Renal disease     Musculoskeletal  (+) Arthritis ,   Abdominal   Peds  Hematology  (+) anemia ,   Anesthesia Other Findings   Reproductive/Obstetrics                             Anesthesia Physical  Anesthesia Plan  ASA: II  Anesthesia Plan: General   Post-op Pain Management:    Induction: Intravenous  PONV Risk Score and Plan: 3 and Ondansetron, Dexamethasone and Midazolam  Airway Management Planned: LMA  Additional Equipment: None  Intra-op Plan:   Post-operative Plan: Extubation in OR  Informed Consent: I have reviewed the patients History and Physical, chart, labs and discussed the procedure including the risks, benefits and alternatives for the proposed anesthesia with the patient or authorized representative who has indicated his/her understanding and acceptance.   Dental advisory given  Plan Discussed with: CRNA, Surgeon and Anesthesiologist  Anesthesia Plan Comments:         Anesthesia Quick Evaluation
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
## 2018-01-21 NOTE — Interval H&P Note (Signed)
History and Physical Interval Note:  01/21/2018 6:41 AM  Huey BienenstockSarah I Flahive  has presented today for surgery, with the diagnosis of wound right foot  The various methods of treatment have been discussed with the patient and family. After consideration of risks, benefits and other options for treatment, the patient has consented to  Procedure(s): RIGHT FOOT DEBRIDEMENT WOUND, CLOSURE VS. SPLIT THICKNESS SKIN GRAFT (Right) SKIN GRAFT SPLIT THICKNESS (Right) as a surgical intervention .  The patient's history has been reviewed, patient examined, no change in status, stable for surgery.  I have reviewed the patient's chart and labs.  Questions were answered to the patient's satisfaction.     Nadara MustardMarcus V Nuchem Grattan

## 2018-01-22 ENCOUNTER — Encounter (HOSPITAL_COMMUNITY): Payer: Self-pay | Admitting: Orthopedic Surgery

## 2018-01-22 ENCOUNTER — Telehealth (INDEPENDENT_AMBULATORY_CARE_PROVIDER_SITE_OTHER): Payer: Self-pay | Admitting: Orthopedic Surgery

## 2018-01-22 MED ORDER — METHOCARBAMOL 500 MG PO TABS: 500.0000 mg | ORAL_TABLET | Freq: Four times a day (QID) | ORAL | 0 refills | Status: DC | PRN

## 2018-01-22 MED ORDER — POLYETHYLENE GLYCOL 3350 17 G PO PACK: 17.0000 g | PACK | Freq: Every day | ORAL | 0 refills | Status: DC | PRN

## 2018-01-22 MED ORDER — HYDROCODONE-ACETAMINOPHEN 7.5-325 MG PO TABS: 1.0000 | ORAL_TABLET | Freq: Three times a day (TID) | ORAL | 0 refills | Status: DC | PRN

## 2018-01-22 NOTE — Plan of Care (Signed)
?  Problem: Coping: ?Goal: Level of anxiety will decrease ?Outcome: Progressing ?  ?Problem: Safety: ?Goal: Ability to remain free from injury will improve ?Outcome: Progressing ?  ?

## 2018-01-22 NOTE — Progress Notes (Signed)
Orthopedic Tech Progress Note Patient Details:  Angela [name] 12/20/2000 [id number]  Ortho Devices Type of Ortho Device: Prafo boot/shoe Ortho Device/Splint Location: bilateral Ortho Device/Splint Interventions: Application   Post Interventions Patient Tolerated: Well Instructions Provided: Care of device   Nikki DomCrawford, Tameem Pullara 01/22/2018, 6:54 PM
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
## 2018-01-22 NOTE — Telephone Encounter (Signed)
Please call and advise

## 2018-01-22 NOTE — Progress Notes (Signed)
[facility] does not have it in stock. Rx called into [facility] for Zyvox 600mg  po bid x 14 days #28 NRF.  KJKG, NP [facility]

## 2018-01-22 NOTE — Progress Notes (Signed)
PT Cancellation Note  Patient Details Name: Angela BienenstockSarah I Helbert MRN: [id number] DOB: 10/21/2000   Cancelled Treatment:    Reason Eval/Treat Not Completed: (P) Patient declined, no reason specified(Pt just getting out of shower and declined needs for PT at this time.  She reports," I don't need to practice, [facility].")   Rakeisha Nyce Artis DelayJ Therma Lasure 01/22/2018, 3:51 PM

## 2018-01-22 NOTE — Progress Notes (Signed)
Patient ID: Angela BienenstockSarah I [name], female   DOB: 06/05/2001, 17 y.o.   MRN: [id number] Wound vac functioning well, plan to follow up in office next week

## 2018-01-22 NOTE — Telephone Encounter (Signed)
Tresa EndoKelly, [facility] would like to speak with Dr. Lajoyce Cornersuda in regards to the patient's wound vac.  Tresa EndoKelly stated that the patient's toes are wrapped up in the would vac which is making her toes hurt and throb.  CB#[phone].  Thank you.

## 2018-01-23 ENCOUNTER — Emergency Department (HOSPITAL_COMMUNITY)
Admission: EM | Admit: 2018-01-23 | Discharge: 2018-01-24 | Disposition: A | Discharge status: Home / Self Care | Payer: BLUE CROSS/BLUE SHIELD | Attending: Emergency Medicine | Admitting: Emergency Medicine

## 2018-01-23 DIAGNOSIS — Z5189 Encounter for other specified aftercare: Secondary | ICD-10-CM

## 2018-01-23 DIAGNOSIS — Z4801 Encounter for change or removal of surgical wound dressing: Secondary | ICD-10-CM | POA: Diagnosis not present

## 2018-01-24 ENCOUNTER — Encounter (HOSPITAL_COMMUNITY): Payer: Self-pay | Admitting: Emergency Medicine

## 2018-01-24 NOTE — ED Notes (Signed)
Instructions given by provider.  Left before I could get in room and go over paperwork.

## 2018-01-24 NOTE — ED Triage Notes (Signed)
Pt DC from hospital 7/25 after R foot debridement. Pt states she noticed "a lot of blood" in her wound vac and it scared her. Pt states she did not call her surgeon she just decided to come here instead. No other complaints.
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
## 2018-01-26 ENCOUNTER — Telehealth (INDEPENDENT_AMBULATORY_CARE_PROVIDER_SITE_OTHER): Payer: Self-pay | Admitting: Orthopedic Surgery

## 2018-01-26 NOTE — Telephone Encounter (Signed)
Called pt and she states that she went to the ER over the weekend because there was bloody drainage that was coming up the tubing of her wound vac and wanted to know if that was ok. I advised that this is what the vac is designed to do suction bloody drainage away from the surgical site. She also stated that the alarm was going off last night and she wrapped and ace bandage around her foot and the alarm stopped. I had the pt check to make sure the device was still on ( that the battery had not died) and she states that it had not and the device was still on and that there alarm was no longer ringing. Pt has an appt on Wednesday and will call with any other questions.

## 2018-01-26 NOTE — Telephone Encounter (Signed)
Patient had surgery with Dr. Lajoyce Cornersuda on Wednesday, July 24th and had some questions about her wound vac.  CB#[phone].  Thank you.

## 2018-01-28 ENCOUNTER — Encounter (INDEPENDENT_AMBULATORY_CARE_PROVIDER_SITE_OTHER): Payer: Self-pay | Admitting: Family

## 2018-01-28 ENCOUNTER — Ambulatory Visit (INDEPENDENT_AMBULATORY_CARE_PROVIDER_SITE_OTHER): Payer: BLUE CROSS/BLUE SHIELD | Admitting: Family

## 2018-01-28 ENCOUNTER — Telehealth (INDEPENDENT_AMBULATORY_CARE_PROVIDER_SITE_OTHER): Payer: Self-pay | Admitting: Orthopedic Surgery

## 2018-01-28 VITALS — Ht 66.0 in | Wt 190.0 lb

## 2018-01-28 DIAGNOSIS — M009 Pyogenic arthritis, unspecified: Secondary | ICD-10-CM

## 2018-01-28 DIAGNOSIS — L97511 Non-pressure chronic ulcer of other part of right foot limited to breakdown of skin: Secondary | ICD-10-CM

## 2018-01-28 MED ORDER — HYDROCODONE-ACETAMINOPHEN 7.5-325 MG PO TABS: 1.0000 | ORAL_TABLET | Freq: Two times a day (BID) | ORAL | 0 refills | Status: DC | PRN
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
## 2018-01-28 NOTE — Telephone Encounter (Signed)
Patient lmom requesting a return call with cleaning instructions for the wound.

## 2018-01-28 NOTE — Telephone Encounter (Signed)
I called and advised dial soap and water rinse dry completley  And to elevate higher than her heart. Pt will call with any other questions.

## 2018-01-30 ENCOUNTER — Inpatient Hospital Stay: Payer: BLUE CROSS/BLUE SHIELD | Admitting: Internal Medicine

## 2018-02-04 ENCOUNTER — Ambulatory Visit (INDEPENDENT_AMBULATORY_CARE_PROVIDER_SITE_OTHER): Payer: BLUE CROSS/BLUE SHIELD | Admitting: Family

## 2018-02-04 ENCOUNTER — Telehealth (INDEPENDENT_AMBULATORY_CARE_PROVIDER_SITE_OTHER): Payer: Self-pay

## 2018-02-04 ENCOUNTER — Encounter (INDEPENDENT_AMBULATORY_CARE_PROVIDER_SITE_OTHER): Payer: Self-pay | Admitting: Family

## 2018-02-04 ENCOUNTER — Ambulatory Visit (INDEPENDENT_AMBULATORY_CARE_PROVIDER_SITE_OTHER): Payer: BLUE CROSS/BLUE SHIELD | Admitting: Orthopedic Surgery

## 2018-02-04 VITALS — Ht 66.0 in | Wt 190.0 lb

## 2018-02-04 DIAGNOSIS — M009 Pyogenic arthritis, unspecified: Secondary | ICD-10-CM

## 2018-02-04 MED ORDER — METHOCARBAMOL 500 MG PO TABS: 500.0000 mg | ORAL_TABLET | Freq: Three times a day (TID) | ORAL | 0 refills | Status: DC

## 2018-02-04 MED ORDER — HYDROCODONE-ACETAMINOPHEN 7.5-325 MG PO TABS: 1.0000 | ORAL_TABLET | Freq: Two times a day (BID) | ORAL | 0 refills | Status: DC | PRN

## 2018-02-04 NOTE — Telephone Encounter (Signed)
Call infectious disease to see if pt can have appt moved up from the 20th. She will be out of her ABX soon.

## 2018-02-11 ENCOUNTER — Encounter (INDEPENDENT_AMBULATORY_CARE_PROVIDER_SITE_OTHER): Payer: Self-pay | Admitting: Family

## 2018-02-11 ENCOUNTER — Ambulatory Visit (INDEPENDENT_AMBULATORY_CARE_PROVIDER_SITE_OTHER): Payer: BLUE CROSS/BLUE SHIELD | Admitting: Family

## 2018-02-11 DIAGNOSIS — M009 Pyogenic arthritis, unspecified: Secondary | ICD-10-CM

## 2018-02-11 MED ORDER — METHOCARBAMOL 500 MG PO TABS: 500.0000 mg | ORAL_TABLET | Freq: Three times a day (TID) | ORAL | 0 refills | Status: DC

## 2018-02-11 MED ORDER — HYDROCODONE-ACETAMINOPHEN 7.5-325 MG PO TABS: 1.0000 | ORAL_TABLET | Freq: Two times a day (BID) | ORAL | 0 refills | Status: DC | PRN
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
## 2018-02-17 ENCOUNTER — Encounter: Payer: Self-pay | Admitting: Internal Medicine

## 2018-02-17 ENCOUNTER — Ambulatory Visit (INDEPENDENT_AMBULATORY_CARE_PROVIDER_SITE_OTHER): Payer: BLUE CROSS/BLUE SHIELD | Admitting: Internal Medicine

## 2018-02-17 VITALS — BP 106/69 | HR 90 | Temp 98.5°F

## 2018-02-17 DIAGNOSIS — L97511 Non-pressure chronic ulcer of other part of right foot limited to breakdown of skin: Secondary | ICD-10-CM | POA: Diagnosis not present

## 2018-02-17 DIAGNOSIS — M00071 Staphylococcal arthritis, right ankle and foot: Secondary | ICD-10-CM | POA: Diagnosis not present

## 2018-02-17 DIAGNOSIS — M009 Pyogenic arthritis, unspecified: Secondary | ICD-10-CM | POA: Diagnosis not present

## 2018-02-17 DIAGNOSIS — Z5181 Encounter for therapeutic drug level monitoring: Secondary | ICD-10-CM

## 2018-02-17 LAB — CBC WITH DIFFERENTIAL/PLATELET
BASOS PCT: 0.4 %
Basophils Absolute: 30 cells/uL (ref 0–200)
EOS PCT: 1.4 %
Eosinophils Absolute: 104 cells/uL (ref 15–500)
HCT: 36.3 % (ref 34.0–46.0)
Hemoglobin: 12.2 g/dL (ref 11.5–15.3)
Lymphs Abs: 2131 cells/uL (ref 1200–5200)
MCH: 28.4 pg (ref 25.0–35.0)
MCHC: 33.6 g/dL (ref 31.0–36.0)
MCV: 84.4 fL (ref 78.0–98.0)
MPV: 9.9 fL (ref 7.5–12.5)
Monocytes Relative: 7.2 %
Neutro Abs: 4603 cells/uL (ref 1800–8000)
Neutrophils Relative %: 62.2 %
Platelets: 353 10*3/uL (ref 140–400)
RBC: 4.3 10*6/uL (ref 3.80–5.10)
RDW: 14.3 % (ref 11.0–15.0)
TOTAL LYMPHOCYTE: 28.8 %
WBC: 7.4 10*3/uL (ref 4.5–13.0)
WBCMIX: 533 {cells}/uL (ref 200–900)

## 2018-02-17 LAB — BASIC METABOLIC PANEL
BUN: 10 mg/dL (ref 7–20)
CALCIUM: 9.2 mg/dL (ref 8.9–10.4)
CHLORIDE: 105 mmol/L (ref 98–110)
CO2: 22 mmol/L (ref 20–32)
CREATININE: 0.56 mg/dL (ref 0.50–1.00)
Glucose, Bld: 81 mg/dL (ref 65–99)
Potassium: 4 mmol/L (ref 3.8–5.1)
Sodium: 138 mmol/L (ref 135–146)

## 2018-02-18 DIAGNOSIS — Z1159 Encounter for screening for other viral diseases: Secondary | ICD-10-CM | POA: Insufficient documentation

## 2018-02-18 DIAGNOSIS — Z5181 Encounter for therapeutic drug level monitoring: Secondary | ICD-10-CM | POA: Insufficient documentation

## 2018-02-18 NOTE — Assessment & Plan Note (Signed)
No signs or symptoms of peripheral neuroapthy Will check cbc

## 2018-02-18 NOTE — Progress Notes (Signed)
   Subjective:    Patient ID: Angela [name], female    DOB: 07/02/2000, 17 y.o.   MRN: [id number]  HPI Here for follow up of a wound infection She was hospitalized in July for a chronic wound infection and septic arthritis of the toe.  Culture from the wound did grow MSSA and she was treated inpatient with vanocmycin and sent home with linezolid for 14 days.  She missed initial follow up after 10 days but comes in now.  Wound is nearly healed and no drainage.  No swelling or significant pain in the area.  No associated neuropathy on linezolid.  No rash or diarrhea.      Review of Systems  Constitutional: Negative for chills, fatigue and fever.  Skin: Negative for rash.       Objective:   Physical Exam  Constitutional: She appears well-developed and well-nourished. No distress.  HENT:  Mouth/Throat: No oropharyngeal exudate.  Cardiovascular: Normal rate, regular rhythm and normal heart sounds.  Pulmonary/Chest: Effort normal and breath sounds normal. No respiratory distress.  Musculoskeletal:  Open area at part of the incision but no drainage, no surrounding erythema, no warmth or tenderness   SH: no tobacco       Assessment & Plan:

## 2018-02-18 NOTE — Assessment & Plan Note (Addendum)
Wound had been non-healing and nearly closed now.  Note from Dr. Lajoyce Cornersuda, NP Vanessa BarbaraZamora noted.  No current signs of infection on my exam. No further antibiotics indicated.  Return prn

## 2018-02-18 NOTE — Assessment & Plan Note (Signed)
Small joint.  No current signs of infection.  Has been off of antibiotics more than 2 weeks and doing well so no indication for continued treatment. She can return PRN

## 2018-02-19 ENCOUNTER — Telehealth (INDEPENDENT_AMBULATORY_CARE_PROVIDER_SITE_OTHER): Payer: Self-pay | Admitting: Orthopedic Surgery

## 2018-02-19 NOTE — Telephone Encounter (Signed)
Patient requests refill on hydrocodone and methocarbamol, due to patient living 45 minutes away she is asking these [facility] [place] if possible. Patients # [phone]

## 2018-02-20 ENCOUNTER — Other Ambulatory Visit (INDEPENDENT_AMBULATORY_CARE_PROVIDER_SITE_OTHER): Payer: Self-pay

## 2018-02-20 MED ORDER — METHOCARBAMOL 500 MG PO TABS: 500.0000 mg | ORAL_TABLET | Freq: Three times a day (TID) | ORAL | 0 refills | Status: DC

## 2018-02-20 NOTE — Telephone Encounter (Signed)
Per Shawn advised that robaxin has been refilled [facility] [place]. We can not give refill on Vicodin until her appt on Wednesday. Called pt to advise and she will call with questions.

## 2018-02-23 ENCOUNTER — Telehealth: Payer: Self-pay | Admitting: *Deleted

## 2018-02-23 NOTE — Telephone Encounter (Signed)
She can be seen here if she wants or wait until the 28th.  If it is infected again, [facility].

## 2018-02-23 NOTE — Telephone Encounter (Signed)
Patient called for her lab results, as she noticed her foot was more swollen and "dark red, like when the infection started" after school today.  She sees Dr Lajoyce Cornersuda 8/28.  Labs not yet available in [facility] (will ask Clydie BraunKaren for results).  Please advise if she should be seen here first. Andree CossHowell, Michelle M, RN

## 2018-02-23 NOTE — Telephone Encounter (Signed)
Relayed to patient. She will call after Dr Audrie Liauda's appointment if she needs to be seen.

## 2018-02-25 ENCOUNTER — Ambulatory Visit (INDEPENDENT_AMBULATORY_CARE_PROVIDER_SITE_OTHER): Payer: BLUE CROSS/BLUE SHIELD | Admitting: Orthopedic Surgery

## 2018-02-25 ENCOUNTER — Encounter (INDEPENDENT_AMBULATORY_CARE_PROVIDER_SITE_OTHER): Payer: Self-pay | Admitting: Orthopedic Surgery

## 2018-02-25 VITALS — Ht 66.0 in | Wt 190.0 lb

## 2018-02-25 DIAGNOSIS — M009 Pyogenic arthritis, unspecified: Secondary | ICD-10-CM

## 2018-02-25 MED ORDER — HYDROCODONE-ACETAMINOPHEN 7.5-325 MG PO TABS: 1.0000 | ORAL_TABLET | Freq: Two times a day (BID) | ORAL | 0 refills | Status: DC | PRN
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
## 2018-03-18 ENCOUNTER — Ambulatory Visit (INDEPENDENT_AMBULATORY_CARE_PROVIDER_SITE_OTHER): Payer: BLUE CROSS/BLUE SHIELD | Admitting: Physician Assistant

## 2018-03-18 ENCOUNTER — Encounter (INDEPENDENT_AMBULATORY_CARE_PROVIDER_SITE_OTHER): Payer: Self-pay | Admitting: Orthopedic Surgery

## 2018-03-18 VITALS — Ht 66.0 in | Wt 190.1 lb

## 2018-03-18 DIAGNOSIS — L97511 Non-pressure chronic ulcer of other part of right foot limited to breakdown of skin: Secondary | ICD-10-CM

## 2018-03-18 DIAGNOSIS — M009 Pyogenic arthritis, unspecified: Secondary | ICD-10-CM

## 2018-03-18 MED ORDER — HYDROCODONE-ACETAMINOPHEN 5-325 MG PO TABS: 1.0000 | ORAL_TABLET | Freq: Four times a day (QID) | ORAL | 0 refills | Status: AC | PRN
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
## 2018-03-23 ENCOUNTER — Telehealth (INDEPENDENT_AMBULATORY_CARE_PROVIDER_SITE_OTHER): Payer: Self-pay

## 2018-03-23 NOTE — Telephone Encounter (Signed)
PA needed for Hydrocodone-Acetaminophen. PA done through [employer].    Confirmation #: [id number] W Prior Approval #: [id number]: [id number] Status: APPROVED

## 2018-04-09 ENCOUNTER — Encounter (INDEPENDENT_AMBULATORY_CARE_PROVIDER_SITE_OTHER): Payer: Self-pay | Admitting: Orthopedic Surgery

## 2018-04-09 ENCOUNTER — Ambulatory Visit (INDEPENDENT_AMBULATORY_CARE_PROVIDER_SITE_OTHER): Payer: BLUE CROSS/BLUE SHIELD | Admitting: Orthopedic Surgery

## 2018-04-09 VITALS — Ht 66.0 in | Wt 190.3 lb

## 2018-04-09 DIAGNOSIS — L97511 Non-pressure chronic ulcer of other part of right foot limited to breakdown of skin: Secondary | ICD-10-CM
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
## 2018-04-12 ENCOUNTER — Emergency Department (HOSPITAL_COMMUNITY)
Admission: EM | Admit: 2018-04-12 | Discharge: 2018-04-12 | Disposition: A | Discharge status: Home / Self Care | Payer: BLUE CROSS/BLUE SHIELD | Attending: Emergency Medicine | Admitting: Emergency Medicine

## 2018-04-12 ENCOUNTER — Emergency Department (HOSPITAL_COMMUNITY): Payer: BLUE CROSS/BLUE SHIELD

## 2018-04-12 ENCOUNTER — Encounter (HOSPITAL_COMMUNITY): Payer: Self-pay | Admitting: Emergency Medicine

## 2018-04-12 ENCOUNTER — Other Ambulatory Visit: Payer: Self-pay

## 2018-04-12 DIAGNOSIS — M7989 Other specified soft tissue disorders: Secondary | ICD-10-CM | POA: Diagnosis not present

## 2018-04-12 DIAGNOSIS — J069 Acute upper respiratory infection, unspecified: Secondary | ICD-10-CM | POA: Diagnosis not present

## 2018-04-12 DIAGNOSIS — R05 Cough: Secondary | ICD-10-CM | POA: Diagnosis not present

## 2018-04-12 DIAGNOSIS — Z79899 Other long term (current) drug therapy: Secondary | ICD-10-CM | POA: Diagnosis not present

## 2018-04-12 DIAGNOSIS — L03031 Cellulitis of right toe: Secondary | ICD-10-CM | POA: Diagnosis not present

## 2018-04-12 DIAGNOSIS — M79674 Pain in right toe(s): Secondary | ICD-10-CM | POA: Diagnosis not present

## 2018-04-12 LAB — CBC WITH DIFFERENTIAL/PLATELET
Abs Immature Granulocytes: 0.01 10*3/uL (ref 0.00–0.07)
BASOS ABS: 0 10*3/uL (ref 0.0–0.1)
Basophils Relative: 1 %
EOS ABS: 0.2 10*3/uL (ref 0.0–0.5)
EOS PCT: 3 %
HEMATOCRIT: 38.1 % (ref 36.0–46.0)
Hemoglobin: 12.2 g/dL (ref 12.0–15.0)
Immature Granulocytes: 0 %
LYMPHS ABS: 1.6 10*3/uL (ref 0.7–4.0)
Lymphocytes Relative: 24 %
MCH: 27.9 pg (ref 26.0–34.0)
MCHC: 32 g/dL (ref 30.0–36.0)
MCV: 87 fL (ref 80.0–100.0)
MONOS PCT: 10 %
Monocytes Absolute: 0.6 10*3/uL (ref 0.1–1.0)
NRBC: 0 % (ref 0.0–0.2)
Neutro Abs: 3.9 10*3/uL (ref 1.7–7.7)
Neutrophils Relative %: 62 %
Platelets: 270 10*3/uL (ref 150–400)
RBC: 4.38 MIL/uL (ref 3.87–5.11)
RDW: 13.4 % (ref 11.5–15.5)
WBC: 6.4 10*3/uL (ref 4.0–10.5)

## 2018-04-12 LAB — GROUP A STREP BY PCR: Group A Strep by PCR: NOT DETECTED

## 2018-04-12 MED ORDER — IBUPROFEN 400 MG PO TABS
400.0000 mg | ORAL_TABLET | Freq: Once | ORAL | Status: AC
  Administered 2018-04-12: 400 mg via ORAL
  Filled 2018-04-12: qty 1

## 2018-04-12 MED ORDER — CLINDAMYCIN HCL 150 MG PO CAPS: ORAL_CAPSULE | ORAL | 0 refills | Status: DC

## 2018-04-12 MED ORDER — ACETAMINOPHEN 325 MG PO TABS
650.0000 mg | ORAL_TABLET | Freq: Once | ORAL | Status: AC
  Administered 2018-04-12: 650 mg via ORAL
  Filled 2018-04-12: qty 2

## 2018-04-12 NOTE — ED Notes (Signed)
Dr McM in to assess 
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
## 2018-04-12 NOTE — ED Notes (Signed)
Pt reports recent debridement of foot by [place] ortho Now toe ? Getting infection x last 2 days   Pt reports no wound care referral or consult   Here for eval of toe

## 2018-04-12 NOTE — ED Notes (Signed)
Lab in to draw

## 2018-04-12 NOTE — ED Triage Notes (Signed)
Pt reports redness, pain, and pus around nail of RT middle toe. States she had surgery back in July on that same foot due to a staph infection. Reports fever a few days ago.

## 2018-04-12 NOTE — ED Notes (Signed)
Spec to lab   To Rad

## 2018-04-12 NOTE — ED Notes (Signed)
Pt complaint of pain   Request for med

## 2018-04-12 NOTE — Discharge Instructions (Signed)
Take over the counter decongestant (such as sudafed), as directed on packaging, for the next week.  Use over the counter normal saline nasal spray, with frequent nose blowing, several times per day for the next 2 weeks. Take over the counter tylenol and ibuprofen, as directed on packaging, as needed for discomfort.  Gargle with warm water several times per day to help with discomfort.  May also use over the counter sore throat pain medicines such as chloraseptic or sucrets, as directed on packaging, as needed for discomfort. Take the prescription as directed. Call your regular medical doctor, your Orthopedist or the Podiatrist tomorrow to schedule a follow up appointment in the next 2 days.  Return to the Emergency Department immediately if worsening.

## 2018-04-29 ENCOUNTER — Encounter (INDEPENDENT_AMBULATORY_CARE_PROVIDER_SITE_OTHER): Payer: Self-pay | Admitting: Orthopedic Surgery

## 2018-04-29 ENCOUNTER — Ambulatory Visit (INDEPENDENT_AMBULATORY_CARE_PROVIDER_SITE_OTHER): Payer: BLUE CROSS/BLUE SHIELD | Admitting: Orthopedic Surgery

## 2018-04-29 VITALS — Ht 66.0 in | Wt 190.1 lb

## 2018-04-29 DIAGNOSIS — L97511 Non-pressure chronic ulcer of other part of right foot limited to breakdown of skin: Secondary | ICD-10-CM

## 2018-04-29 MED ORDER — MUPIROCIN 2 % EX OINT: 1.0000 "application " | TOPICAL_OINTMENT | Freq: Every day | CUTANEOUS | 0 refills | Status: DC
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
## 2018-05-21 ENCOUNTER — Ambulatory Visit (INDEPENDENT_AMBULATORY_CARE_PROVIDER_SITE_OTHER): Payer: BLUE CROSS/BLUE SHIELD | Admitting: Orthopedic Surgery

## 2018-06-04 ENCOUNTER — Ambulatory Visit (INDEPENDENT_AMBULATORY_CARE_PROVIDER_SITE_OTHER): Payer: BLUE CROSS/BLUE SHIELD | Admitting: Orthopedic Surgery

## 2018-06-04 VITALS — Ht 66.0 in | Wt 190.0 lb

## 2018-06-04 DIAGNOSIS — L97511 Non-pressure chronic ulcer of other part of right foot limited to breakdown of skin: Secondary | ICD-10-CM | POA: Diagnosis not present

## 2018-06-05 ENCOUNTER — Encounter (INDEPENDENT_AMBULATORY_CARE_PROVIDER_SITE_OTHER): Payer: Self-pay | Admitting: Orthopedic Surgery
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
## 2018-06-17 DIAGNOSIS — G8220 Paraplegia, unspecified: Secondary | ICD-10-CM | POA: Diagnosis not present

## 2018-06-17 DIAGNOSIS — F339 Major depressive disorder, recurrent, unspecified: Secondary | ICD-10-CM | POA: Diagnosis not present

## 2018-06-30 ENCOUNTER — Inpatient Hospital Stay (HOSPITAL_COMMUNITY)
Admission: EM | Admit: 2018-06-30 | Discharge: 2018-07-05 | DRG: 501 | Disposition: A | Discharge status: Home / Self Care | Payer: BLUE CROSS/BLUE SHIELD | Attending: Internal Medicine | Admitting: Internal Medicine

## 2018-06-30 ENCOUNTER — Other Ambulatory Visit: Payer: Self-pay

## 2018-06-30 ENCOUNTER — Encounter (HOSPITAL_COMMUNITY): Payer: Self-pay

## 2018-06-30 ENCOUNTER — Emergency Department (HOSPITAL_COMMUNITY): Payer: BLUE CROSS/BLUE SHIELD

## 2018-06-30 DIAGNOSIS — Z79899 Other long term (current) drug therapy: Secondary | ICD-10-CM

## 2018-06-30 DIAGNOSIS — S34109S Unspecified injury to unspecified level of lumbar spinal cord, sequela: Secondary | ICD-10-CM | POA: Diagnosis not present

## 2018-06-30 DIAGNOSIS — F329 Major depressive disorder, single episode, unspecified: Secondary | ICD-10-CM | POA: Diagnosis present

## 2018-06-30 DIAGNOSIS — Z8619 Personal history of other infectious and parasitic diseases: Secondary | ICD-10-CM | POA: Diagnosis not present

## 2018-06-30 DIAGNOSIS — L97511 Non-pressure chronic ulcer of other part of right foot limited to breakdown of skin: Secondary | ICD-10-CM | POA: Diagnosis not present

## 2018-06-30 DIAGNOSIS — Z87440 Personal history of urinary (tract) infections: Secondary | ICD-10-CM | POA: Diagnosis not present

## 2018-06-30 DIAGNOSIS — Z881 Allergy status to other antibiotic agents status: Secondary | ICD-10-CM | POA: Diagnosis not present

## 2018-06-30 DIAGNOSIS — M869 Osteomyelitis, unspecified: Secondary | ICD-10-CM | POA: Diagnosis present

## 2018-06-30 DIAGNOSIS — Z872 Personal history of diseases of the skin and subcutaneous tissue: Secondary | ICD-10-CM | POA: Diagnosis not present

## 2018-06-30 DIAGNOSIS — Z91040 Latex allergy status: Secondary | ICD-10-CM

## 2018-06-30 DIAGNOSIS — L02415 Cutaneous abscess of right lower limb: Secondary | ICD-10-CM | POA: Diagnosis not present

## 2018-06-30 DIAGNOSIS — G8220 Paraplegia, unspecified: Secondary | ICD-10-CM | POA: Diagnosis present

## 2018-06-30 DIAGNOSIS — E876 Hypokalemia: Secondary | ICD-10-CM | POA: Diagnosis not present

## 2018-06-30 DIAGNOSIS — F4322 Adjustment disorder with anxiety: Secondary | ICD-10-CM | POA: Diagnosis not present

## 2018-06-30 DIAGNOSIS — M868X7 Other osteomyelitis, ankle and foot: Secondary | ICD-10-CM | POA: Diagnosis not present

## 2018-06-30 DIAGNOSIS — W3400XS Accidental discharge from unspecified firearms or gun, sequela: Secondary | ICD-10-CM

## 2018-06-30 DIAGNOSIS — F32A Depression, unspecified: Secondary | ICD-10-CM | POA: Diagnosis present

## 2018-06-30 DIAGNOSIS — L03115 Cellulitis of right lower limb: Secondary | ICD-10-CM

## 2018-06-30 DIAGNOSIS — N319 Neuromuscular dysfunction of bladder, unspecified: Secondary | ICD-10-CM | POA: Diagnosis not present

## 2018-06-30 DIAGNOSIS — M86171 Other acute osteomyelitis, right ankle and foot: Secondary | ICD-10-CM | POA: Diagnosis not present

## 2018-06-30 DIAGNOSIS — F419 Anxiety disorder, unspecified: Secondary | ICD-10-CM | POA: Diagnosis present

## 2018-06-30 DIAGNOSIS — L97519 Non-pressure chronic ulcer of other part of right foot with unspecified severity: Secondary | ICD-10-CM | POA: Diagnosis present

## 2018-06-30 DIAGNOSIS — S34109A Unspecified injury to unspecified level of lumbar spinal cord, initial encounter: Secondary | ICD-10-CM | POA: Diagnosis present

## 2018-06-30 MED ORDER — MORPHINE SULFATE (PF) 4 MG/ML IV SOLN
4.0000 mg | Freq: Once | INTRAVENOUS | Status: AC
  Administered 2018-06-30: 4 mg via INTRAVENOUS
  Filled 2018-06-30: qty 1

## 2018-06-30 MED ORDER — ONDANSETRON HCL 4 MG/2ML IJ SOLN
4.0000 mg | Freq: Once | INTRAMUSCULAR | Status: AC
  Administered 2018-06-30: 4 mg via INTRAVENOUS
  Filled 2018-06-30: qty 2
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
## 2018-06-30 NOTE — ED Triage Notes (Signed)
Pt has wound to right plantar foot that has ongoing wound treatment after debridement surgery in July of 2019. For the past 3 days pt reports increased warmth, redness, and pain. Next f/u appt 07/02/18 with Dr Lajoyce Cornersuda.

## 2018-07-01 ENCOUNTER — Encounter (HOSPITAL_COMMUNITY): Payer: Self-pay | Admitting: Internal Medicine

## 2018-07-01 DIAGNOSIS — M869 Osteomyelitis, unspecified: Secondary | ICD-10-CM | POA: Diagnosis not present

## 2018-07-01 DIAGNOSIS — L03115 Cellulitis of right lower limb: Secondary | ICD-10-CM | POA: Diagnosis not present

## 2018-07-01 DIAGNOSIS — M86171 Other acute osteomyelitis, right ankle and foot: Secondary | ICD-10-CM | POA: Diagnosis not present

## 2018-07-01 DIAGNOSIS — Z881 Allergy status to other antibiotic agents status: Secondary | ICD-10-CM | POA: Diagnosis not present

## 2018-07-01 DIAGNOSIS — L97519 Non-pressure chronic ulcer of other part of right foot with unspecified severity: Secondary | ICD-10-CM | POA: Diagnosis not present

## 2018-07-01 DIAGNOSIS — W3400XS Accidental discharge from unspecified firearms or gun, sequela: Secondary | ICD-10-CM | POA: Diagnosis not present

## 2018-07-01 DIAGNOSIS — N319 Neuromuscular dysfunction of bladder, unspecified: Secondary | ICD-10-CM | POA: Diagnosis not present

## 2018-07-01 DIAGNOSIS — E876 Hypokalemia: Secondary | ICD-10-CM | POA: Diagnosis not present

## 2018-07-01 DIAGNOSIS — G8220 Paraplegia, unspecified: Secondary | ICD-10-CM | POA: Diagnosis not present

## 2018-07-01 DIAGNOSIS — Z91040 Latex allergy status: Secondary | ICD-10-CM | POA: Diagnosis not present

## 2018-07-01 DIAGNOSIS — F329 Major depressive disorder, single episode, unspecified: Secondary | ICD-10-CM | POA: Diagnosis not present

## 2018-07-01 DIAGNOSIS — S34109S Unspecified injury to unspecified level of lumbar spinal cord, sequela: Secondary | ICD-10-CM | POA: Diagnosis not present

## 2018-07-01 DIAGNOSIS — M868X7 Other osteomyelitis, ankle and foot: Secondary | ICD-10-CM | POA: Diagnosis not present

## 2018-07-01 DIAGNOSIS — Z79899 Other long term (current) drug therapy: Secondary | ICD-10-CM | POA: Diagnosis not present

## 2018-07-01 DIAGNOSIS — F4322 Adjustment disorder with anxiety: Secondary | ICD-10-CM | POA: Diagnosis not present

## 2018-07-01 HISTORY — PX: WISDOM TOOTH EXTRACTION: SHX21

## 2018-07-01 LAB — CBC WITH DIFFERENTIAL/PLATELET
Abs Immature Granulocytes: 0.03 10*3/uL (ref 0.00–0.07)
Basophils Absolute: 0 10*3/uL (ref 0.0–0.1)
Basophils Relative: 0 %
EOS ABS: 0.1 10*3/uL (ref 0.0–0.5)
Eosinophils Relative: 1 %
HCT: 39.4 % (ref 36.0–46.0)
Hemoglobin: 12.7 g/dL (ref 12.0–15.0)
IMMATURE GRANULOCYTES: 0 %
Lymphocytes Relative: 20 %
Lymphs Abs: 1.9 10*3/uL (ref 0.7–4.0)
MCH: 28.5 pg (ref 26.0–34.0)
MCHC: 32.2 g/dL (ref 30.0–36.0)
MCV: 88.5 fL (ref 80.0–100.0)
MONOS PCT: 8 %
Monocytes Absolute: 0.8 10*3/uL (ref 0.1–1.0)
Neutro Abs: 6.7 10*3/uL (ref 1.7–7.7)
Neutrophils Relative %: 71 %
Platelets: 346 10*3/uL (ref 150–400)
RBC: 4.45 MIL/uL (ref 3.87–5.11)
RDW: 12.8 % (ref 11.5–15.5)
WBC: 9.5 10*3/uL (ref 4.0–10.5)
nRBC: 0 % (ref 0.0–0.2)

## 2018-07-01 LAB — BASIC METABOLIC PANEL
Anion gap: 8 (ref 5–15)
BUN: 8 mg/dL (ref 6–20)
CO2: 24 mmol/L (ref 22–32)
Calcium: 9 mg/dL (ref 8.9–10.3)
Chloride: 106 mmol/L (ref 98–111)
Creatinine, Ser: 0.66 mg/dL (ref 0.44–1.00)
GFR calc Af Amer: >60 mL/min (ref >60)
GFR calc non Af Amer: >60 mL/min (ref >60)
Glucose, Bld: 89 mg/dL (ref 70–99)
Potassium: 3.1 mmol/L — ABNORMAL LOW (ref 3.5–5.1)
Sodium: 138 mmol/L (ref 135–145)

## 2018-07-01 LAB — HEPATIC FUNCTION PANEL
ALBUMIN: 3.9 g/dL (ref 3.5–5.0)
ALT: 12 U/L (ref 0–44)
AST: 15 U/L (ref 15–41)
Alkaline Phosphatase: 66 U/L (ref 38–126)
Bilirubin, Direct: <0.1 mg/dL (ref 0.0–0.2)
Total Bilirubin: 0.3 mg/dL (ref 0.3–1.2)
Total Protein: 7.6 g/dL (ref 6.5–8.1)

## 2018-07-01 LAB — PHOSPHORUS: Phosphorus: 4 mg/dL (ref 2.5–4.6)

## 2018-07-01 LAB — HEMOGLOBIN A1C
Hgb A1c MFr Bld: 4.7 % — ABNORMAL LOW (ref 4.8–5.6)
Mean Plasma Glucose: 88.19 mg/dL

## 2018-07-01 LAB — PREALBUMIN: Prealbumin: 24 mg/dL (ref 18–38)

## 2018-07-01 LAB — C-REACTIVE PROTEIN: CRP: 12.4 mg/dL — ABNORMAL HIGH (ref <1.0)

## 2018-07-01 LAB — LACTIC ACID, PLASMA: Lactic Acid, Venous: 1 mmol/L (ref 0.5–1.9)

## 2018-07-01 LAB — SEDIMENTATION RATE: Sed Rate: 52 mm/hr — ABNORMAL HIGH (ref 0–22)

## 2018-07-01 LAB — MAGNESIUM: Magnesium: 2 mg/dL (ref 1.7–2.4)

## 2018-07-01 MED ORDER — ACETAMINOPHEN 325 MG PO TABS
650.0000 mg | ORAL_TABLET | Freq: Four times a day (QID) | ORAL | Status: DC | PRN
  Administered 2018-07-02: 650 mg via ORAL
  Filled 2018-07-01: qty 2

## 2018-07-01 MED ORDER — ONDANSETRON HCL 4 MG/2ML IJ SOLN
4.0000 mg | Freq: Four times a day (QID) | INTRAMUSCULAR | Status: DC | PRN
  Administered 2018-07-01: 4 mg via INTRAVENOUS
  Filled 2018-07-01: qty 2

## 2018-07-01 MED ORDER — ACETAMINOPHEN 650 MG RE SUPP: 650.0000 mg | Freq: Four times a day (QID) | RECTAL | Status: DC | PRN

## 2018-07-01 MED ORDER — AMITRIPTYLINE HCL 25 MG PO TABS
50.0000 mg | ORAL_TABLET | Freq: Every day | ORAL | Status: DC
  Administered 2018-07-01 – 2018-07-04 (×4): 50 mg via ORAL
  Filled 2018-07-01: qty 1
  Filled 2018-07-01 (×3): qty 2
  Filled 2018-07-01: qty 1
  Filled 2018-07-01: qty 2

## 2018-07-01 MED ORDER — KETOROLAC TROMETHAMINE 30 MG/ML IJ SOLN
30.0000 mg | Freq: Once | INTRAMUSCULAR | Status: AC
  Administered 2018-07-01: 30 mg via INTRAVENOUS
  Filled 2018-07-01: qty 1

## 2018-07-01 MED ORDER — ESCITALOPRAM OXALATE 10 MG PO TABS: 10.0000 mg | ORAL_TABLET | Freq: Every day | ORAL | Status: DC

## 2018-07-01 MED ORDER — MORPHINE SULFATE (PF) 4 MG/ML IV SOLN
4.0000 mg | INTRAVENOUS | Status: DC | PRN
  Administered 2018-07-01 – 2018-07-03 (×6): 4 mg via INTRAVENOUS
  Filled 2018-07-01 (×6): qty 1

## 2018-07-01 MED ORDER — BACLOFEN 10 MG PO TABS
10.0000 mg | ORAL_TABLET | Freq: Every day | ORAL | Status: DC
  Administered 2018-07-01 – 2018-07-04 (×4): 10 mg via ORAL
  Filled 2018-07-01 (×4): qty 1

## 2018-07-01 MED ORDER — ONDANSETRON HCL 4 MG PO TABS: 4.0000 mg | ORAL_TABLET | Freq: Four times a day (QID) | ORAL | Status: DC | PRN

## 2018-07-01 MED ORDER — GABAPENTIN 300 MG PO CAPS
600.0000 mg | ORAL_CAPSULE | Freq: Three times a day (TID) | ORAL | Status: DC
  Administered 2018-07-01 – 2018-07-05 (×12): 600 mg via ORAL
  Filled 2018-07-01 (×13): qty 2

## 2018-07-01 MED ORDER — SODIUM CHLORIDE 0.9 % IV SOLN
2.0000 g | Freq: Three times a day (TID) | INTRAVENOUS | Status: DC
  Administered 2018-07-01 – 2018-07-05 (×11): 2 g via INTRAVENOUS
  Filled 2018-07-01 (×15): qty 2

## 2018-07-01 MED ORDER — OXYCODONE HCL 5 MG PO TABS
5.0000 mg | ORAL_TABLET | ORAL | Status: DC | PRN
  Administered 2018-07-01 – 2018-07-02 (×5): 5 mg via ORAL
  Filled 2018-07-01 (×5): qty 1

## 2018-07-01 MED ORDER — NORETHIN ACE-ETH ESTRAD-FE 1-20 MG-MCG PO TABS: 1.0000 | ORAL_TABLET | Freq: Every day | ORAL | Status: DC

## 2018-07-01 MED ORDER — LINEZOLID 600 MG/300ML IV SOLN
600.0000 mg | Freq: Two times a day (BID) | INTRAVENOUS | Status: AC
  Administered 2018-07-01: 600 mg via INTRAVENOUS
  Filled 2018-07-01 (×2): qty 300

## 2018-07-01 MED ORDER — ESCITALOPRAM OXALATE 10 MG PO TABS
10.0000 mg | ORAL_TABLET | Freq: Every day | ORAL | Status: DC
  Administered 2018-07-01 – 2018-07-04 (×4): 10 mg via ORAL
  Filled 2018-07-01 (×4): qty 1

## 2018-07-01 MED ORDER — HEPARIN SODIUM (PORCINE) 5000 UNIT/ML IJ SOLN
5000.0000 [IU] | Freq: Three times a day (TID) | INTRAMUSCULAR | Status: DC
  Administered 2018-07-01 – 2018-07-05 (×11): 5000 [IU] via SUBCUTANEOUS
  Filled 2018-07-01 (×11): qty 1

## 2018-07-01 MED ORDER — SODIUM CHLORIDE 0.9 % IV SOLN
INTRAVENOUS | Status: DC | PRN
  Administered 2018-07-01: 500 mL via INTRAVENOUS
  Administered 2018-07-02: 250 mL via INTRAVENOUS

## 2018-07-01 MED ORDER — NORETHIN ACE-ETH ESTRAD-FE 1-20 MG-MCG PO TABS
1.0000 | ORAL_TABLET | Freq: Every day | ORAL | Status: DC
  Administered 2018-07-01: 1 via ORAL
  Filled 2018-07-01: qty 1

## 2018-07-01 NOTE — ED Notes (Signed)
Pt updated on plan of care,  
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
## 2018-07-01 NOTE — ED Notes (Signed)
Dr Ortiz at bedside,  

## 2018-07-01 NOTE — ED Notes (Signed)
Pt resting with eyes closed upon RN entering the room, pt states that the pain is not any better, pt and family updated on plan of care,

## 2018-07-01 NOTE — ED Notes (Signed)
Pt was given lunch tray.  

## 2018-07-01 NOTE — Progress Notes (Signed)
06/30/2018 10:54 PM  07/01/2018 3:25 PM  Huey Bienenstock was seen and examined.  The H&P by the admitting provider, orders, imaging was reviewed.  Please see new orders.  Will continue to follow.   Pt being transferred to [facility] as no beds available at [facility].  Pt will need ID and orthopedics Lajoyce Corners) consult when she arrives in [place].   Maryln Manuel, MD [facility]

## 2018-07-02 ENCOUNTER — Ambulatory Visit (INDEPENDENT_AMBULATORY_CARE_PROVIDER_SITE_OTHER): Payer: Self-pay | Admitting: Physician Assistant

## 2018-07-02 ENCOUNTER — Ambulatory Visit (INDEPENDENT_AMBULATORY_CARE_PROVIDER_SITE_OTHER): Payer: BLUE CROSS/BLUE SHIELD | Admitting: Orthopedic Surgery

## 2018-07-02 DIAGNOSIS — Z872 Personal history of diseases of the skin and subcutaneous tissue: Secondary | ICD-10-CM

## 2018-07-02 DIAGNOSIS — M869 Osteomyelitis, unspecified: Secondary | ICD-10-CM

## 2018-07-02 DIAGNOSIS — Z91040 Latex allergy status: Secondary | ICD-10-CM

## 2018-07-02 DIAGNOSIS — F329 Major depressive disorder, single episode, unspecified: Secondary | ICD-10-CM

## 2018-07-02 DIAGNOSIS — L97519 Non-pressure chronic ulcer of other part of right foot with unspecified severity: Secondary | ICD-10-CM

## 2018-07-02 DIAGNOSIS — L03115 Cellulitis of right lower limb: Secondary | ICD-10-CM

## 2018-07-02 DIAGNOSIS — F419 Anxiety disorder, unspecified: Secondary | ICD-10-CM

## 2018-07-02 DIAGNOSIS — Z8619 Personal history of other infectious and parasitic diseases: Secondary | ICD-10-CM

## 2018-07-02 DIAGNOSIS — Z87440 Personal history of urinary (tract) infections: Secondary | ICD-10-CM

## 2018-07-02 DIAGNOSIS — Z881 Allergy status to other antibiotic agents status: Secondary | ICD-10-CM

## 2018-07-02 DIAGNOSIS — N319 Neuromuscular dysfunction of bladder, unspecified: Secondary | ICD-10-CM

## 2018-07-02 DIAGNOSIS — G8220 Paraplegia, unspecified: Secondary | ICD-10-CM

## 2018-07-02 LAB — BASIC METABOLIC PANEL
ANION GAP: 10 (ref 5–15)
BUN: 8 mg/dL (ref 6–20)
CHLORIDE: 107 mmol/L (ref 98–111)
CO2: 23 mmol/L (ref 22–32)
Calcium: 8.6 mg/dL — ABNORMAL LOW (ref 8.9–10.3)
Creatinine, Ser: 0.7 mg/dL (ref 0.44–1.00)
GFR calc Af Amer: >60 mL/min (ref >60)
GFR calc non Af Amer: >60 mL/min (ref >60)
GLUCOSE: 107 mg/dL — AB (ref 70–99)
Potassium: 3.9 mmol/L (ref 3.5–5.1)
Sodium: 140 mmol/L (ref 135–145)

## 2018-07-02 LAB — CBC WITH DIFFERENTIAL/PLATELET
Abs Immature Granulocytes: 0.02 10*3/uL (ref 0.00–0.07)
Basophils Absolute: 0 10*3/uL (ref 0.0–0.1)
Basophils Relative: 1 %
Eosinophils Absolute: 0.2 10*3/uL (ref 0.0–0.5)
Eosinophils Relative: 3 %
HCT: 34.7 % — ABNORMAL LOW (ref 36.0–46.0)
Hemoglobin: 11 g/dL — ABNORMAL LOW (ref 12.0–15.0)
Immature Granulocytes: 0 %
Lymphocytes Relative: 28 %
Lymphs Abs: 1.7 10*3/uL (ref 0.7–4.0)
MCH: 28.1 pg (ref 26.0–34.0)
MCHC: 31.7 g/dL (ref 30.0–36.0)
MCV: 88.5 fL (ref 80.0–100.0)
Monocytes Absolute: 0.5 10*3/uL (ref 0.1–1.0)
Monocytes Relative: 8 %
Neutro Abs: 3.7 10*3/uL (ref 1.7–7.7)
Neutrophils Relative %: 60 %
Platelets: 280 10*3/uL (ref 150–400)
RBC: 3.92 MIL/uL (ref 3.87–5.11)
RDW: 12.8 % (ref 11.5–15.5)
WBC: 6.1 10*3/uL (ref 4.0–10.5)
nRBC: 0 % (ref 0.0–0.2)

## 2018-07-02 MED ORDER — SODIUM CHLORIDE 0.9 % IV SOLN
700.0000 mg | Freq: Every day | INTRAVENOUS | Status: DC
  Administered 2018-07-03 – 2018-07-04 (×2): 700 mg via INTRAVENOUS
  Filled 2018-07-02 (×3): qty 14

## 2018-07-02 MED ORDER — SODIUM CHLORIDE 0.9 % IV SOLN
700.0000 mg | Freq: Once | INTRAVENOUS | Status: AC
  Administered 2018-07-02: 700 mg via INTRAVENOUS
  Filled 2018-07-02: qty 14

## 2018-07-02 NOTE — Progress Notes (Signed)
Report called to [facility] [place]

## 2018-07-02 NOTE — Progress Notes (Signed)
[facility] Coordinator will follow pt with ID team to support Home Infusion Pharm[facility] services if needed at DC to home.  If patient discharges after hours, please call [phone].   Sedalia Muta 07/02/2018, 4:25 PM
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
## 2018-07-02 NOTE — Plan of Care (Signed)
  Problem: Safety: Goal: Ability to remain free from injury will improve Outcome: Progressing   

## 2018-07-02 NOTE — Consult Note (Addendum)
WOC consult requested for right foot prior to ortho service involvement.  X-ray indicates, "Erosion at the distal fifth metatarsal superimposed on prior postoperative change, reflecting osteomyelitis, with an overlying large soft tissue ulceration."This complex medical condition is beyond the scope of practice for WOC nursing.  Dr Lajoyce Corners plans to perform surgery, according to the EMR. Please refer to ortho service for further questions regarding plan of care. Please re-consult if further assistance is needed.  Thank-you,  Cammie Mcgee MSN, RN, CWOCN, [place], CNS [phone]
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
## 2018-07-02 NOTE — Plan of Care (Signed)
Plan of care reviewed and discussed with the patient. 

## 2018-07-02 NOTE — Progress Notes (Signed)
Epic would not allow me to document the wound to create the column for measurments.  The RIGHT foot wound measures as follows: L: 2.3cm, W: 1.2cm, D: 0.6cm.  Surrounding tissue is intact, and blanchable. No drainage, nor odor is noted.
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
## 2018-07-02 NOTE — Progress Notes (Signed)
Pharmacy Antibiotic Note  Angela [name] is a 18 y.o. female admitted on 06/30/2018 with osteomyelitis.  She is paraplegic with recurrent UTIs and chronic right foot ulcer.  Xray + osteo of distal 5th metatarsal.  Planning transfer to [facility] for amputation tomorrow.   Pharmacy has been consulted for Daptomycin dosing.  Plan: Daptomycin 700mg  (~8mg /kg) IV q24h Check baseline CK & weekly thereafter Cefepime per MD Monitor renal function and cx data   Height: 5\' 6"  (167.6 cm) Weight: 190 lb (86.2 kg) IBW/kg (Calculated) : 59.3  Temp (24hrs), Avg:98.5 F (36.9 C), Min:98.3 F (36.8 C), Max:98.7 F (37.1 C)  Recent Labs  Lab 06/30/18 2336 07/02/18 0914  WBC 9.5 6.1  CREATININE 0.66 0.70  LATICACIDVEN 1.0  --     Estimated Creatinine Clearance: 125.2 mL/min (by C-G formula based on SCr of 0.7 mg/dL).    Allergies  Allergen Reactions  . Vancomycin Rash    Had red itchy rash of face and neck  . Latex Rash    Antimicrobials this admission: 12/31 Cefepime >>  1/1 Zyvox x1 1/2 Daptomycin >>   Dose adjustments this admission:  Microbiology results: 12/31 BCx: NGTD  Thank you for allowing pharmacy to be a part of this patient's care.  Elson Clan 07/02/2018 4:10 PM

## 2018-07-02 NOTE — Anesthesia Preprocedure Evaluation (Addendum)
Anesthesia Evaluation  Patient identified by MRN, date of birth, ID band Patient awake    Reviewed: Allergy & Precautions, NPO status , Patient's Chart, lab work & pertinent test results  History of Anesthesia Complications Negative for: history of anesthetic complications  Airway Mallampati: II  TM Distance: >3 FB Neck ROM: Full    Dental  (+) Teeth Intact   Pulmonary neg pulmonary ROS,    Pulmonary exam normal breath sounds clear to auscultation       Cardiovascular negative cardio ROS Normal cardiovascular exam Rhythm:Regular Rate:Normal     Neuro/Psych  Headaches, Anxiety Depression Paraplegia    GI/Hepatic negative GI ROS, Neg liver ROS,   Endo/Other  negative endocrine ROS  Renal/GU negative Renal ROS     Musculoskeletal Septic arthritis right foot   Abdominal   Peds  Hematology  (+) anemia , Hgb 11.0   Anesthesia Other Findings Day of surgery medications reviewed with the patient.  Reproductive/Obstetrics                            Anesthesia Physical Anesthesia Plan  ASA: III  Anesthesia Plan: Regional and MAC   Post-op Pain Management:    Induction:   PONV Risk Score and Plan: 2 and Treatment may vary due to age or medical condition, Ondansetron, Dexamethasone and Midazolam  Airway Management Planned: Natural Airway and Simple Face Mask  Additional Equipment:   Intra-op Plan:   Post-operative Plan:   Informed Consent: I have reviewed the patients History and Physical, chart, labs and discussed the procedure including the risks, benefits and alternatives for the proposed anesthesia with the patient or authorized representative who has indicated his/her understanding and acceptance.   Dental advisory given  Plan Discussed with: CRNA  Anesthesia Plan Comments:        Anesthesia Quick Evaluation
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
## 2018-07-03 ENCOUNTER — Encounter (HOSPITAL_COMMUNITY)
Admission: EM | Disposition: A | Discharge status: Home / Self Care | Payer: Self-pay | Source: Home / Self Care | Attending: Internal Medicine

## 2018-07-03 ENCOUNTER — Encounter (HOSPITAL_COMMUNITY): Payer: Self-pay | Admitting: *Deleted

## 2018-07-03 ENCOUNTER — Inpatient Hospital Stay (HOSPITAL_COMMUNITY): Payer: BLUE CROSS/BLUE SHIELD | Admitting: Anesthesiology

## 2018-07-03 DIAGNOSIS — M86171 Other acute osteomyelitis, right ankle and foot: Secondary | ICD-10-CM

## 2018-07-03 HISTORY — PX: AMPUTATION: SHX166

## 2018-07-03 LAB — POCT PREGNANCY, URINE: Preg Test, Ur: NEGATIVE

## 2018-07-03 LAB — MRSA PCR SCREENING: MRSA by PCR: NEGATIVE

## 2018-07-03 SURGERY — AMPUTATION, FOOT, RAY
Anesthesia: Monitor Anesthesia Care | Site: Foot | Laterality: Right

## 2018-07-03 MED ORDER — MIDAZOLAM HCL 2 MG/2ML IJ SOLN
INTRAMUSCULAR | Status: AC
  Filled 2018-07-03: qty 2

## 2018-07-03 MED ORDER — SODIUM CHLORIDE 0.9 % IV SOLN
INTRAVENOUS | Status: DC | PRN
  Administered 2018-07-03: 15 ug/min via INTRAVENOUS

## 2018-07-03 MED ORDER — FENTANYL CITRATE (PF) 100 MCG/2ML IJ SOLN
100.0000 ug | Freq: Once | INTRAMUSCULAR | Status: AC
  Administered 2018-07-03: 100 ug via INTRAVENOUS

## 2018-07-03 MED ORDER — BUPIVACAINE HCL (PF) 0.5 % IJ SOLN
INTRAMUSCULAR | Status: DC | PRN
  Administered 2018-07-03: 28 mL

## 2018-07-03 MED ORDER — DOCUSATE SODIUM 100 MG PO CAPS
100.0000 mg | ORAL_CAPSULE | Freq: Two times a day (BID) | ORAL | Status: DC
  Administered 2018-07-03 – 2018-07-05 (×4): 100 mg via ORAL
  Filled 2018-07-03 (×4): qty 1

## 2018-07-03 MED ORDER — CEFAZOLIN SODIUM-DEXTROSE 2-4 GM/100ML-% IV SOLN
2.0000 g | INTRAVENOUS | Status: AC
  Administered 2018-07-03: 2 g via INTRAVENOUS
  Filled 2018-07-03: qty 100

## 2018-07-03 MED ORDER — OXYCODONE HCL 5 MG PO TABS
10.0000 mg | ORAL_TABLET | ORAL | Status: DC | PRN
  Administered 2018-07-03 – 2018-07-05 (×4): 10 mg via ORAL
  Filled 2018-07-03: qty 2

## 2018-07-03 MED ORDER — LACTATED RINGERS IV SOLN
INTRAVENOUS | Status: DC | PRN
  Administered 2018-07-03: 11:00:00 via INTRAVENOUS

## 2018-07-03 MED ORDER — CHLORHEXIDINE GLUCONATE 4 % EX LIQD: 60.0000 mL | Freq: Once | CUTANEOUS | Status: DC

## 2018-07-03 MED ORDER — PROPOFOL 1000 MG/100ML IV EMUL
INTRAVENOUS | Status: AC
  Filled 2018-07-03: qty 100

## 2018-07-03 MED ORDER — PHENYLEPHRINE HCL 10 MG/ML IJ SOLN
INTRAMUSCULAR | Status: DC | PRN
  Administered 2018-07-03: 80 ug via INTRAVENOUS

## 2018-07-03 MED ORDER — ONDANSETRON HCL 4 MG/2ML IJ SOLN
INTRAMUSCULAR | Status: DC | PRN
  Administered 2018-07-03: 4 mg via INTRAVENOUS

## 2018-07-03 MED ORDER — METHOCARBAMOL 1000 MG/10ML IJ SOLN
500.0000 mg | Freq: Four times a day (QID) | INTRAVENOUS | Status: DC | PRN
  Filled 2018-07-03: qty 5

## 2018-07-03 MED ORDER — MIDAZOLAM HCL 2 MG/2ML IJ SOLN
2.0000 mg | Freq: Once | INTRAMUSCULAR | Status: AC
  Administered 2018-07-03: 2 mg via INTRAVENOUS

## 2018-07-03 MED ORDER — METOCLOPRAMIDE HCL 5 MG PO TABS: 5.0000 mg | ORAL_TABLET | Freq: Three times a day (TID) | ORAL | Status: DC | PRN

## 2018-07-03 MED ORDER — MIDAZOLAM HCL 5 MG/5ML IJ SOLN
INTRAMUSCULAR | Status: DC | PRN
  Administered 2018-07-03: 2 mg via INTRAVENOUS

## 2018-07-03 MED ORDER — BISACODYL 10 MG RE SUPP: 10.0000 mg | Freq: Every day | RECTAL | Status: DC | PRN

## 2018-07-03 MED ORDER — FENTANYL CITRATE (PF) 100 MCG/2ML IJ SOLN
INTRAMUSCULAR | Status: AC
  Administered 2018-07-03: 100 ug via INTRAVENOUS
  Filled 2018-07-03: qty 2

## 2018-07-03 MED ORDER — PROPOFOL 10 MG/ML IV BOLUS
INTRAVENOUS | Status: DC | PRN
  Administered 2018-07-03: 50 mg via INTRAVENOUS

## 2018-07-03 MED ORDER — ONDANSETRON HCL 4 MG PO TABS: 4.0000 mg | ORAL_TABLET | Freq: Four times a day (QID) | ORAL | Status: DC | PRN

## 2018-07-03 MED ORDER — POLYETHYLENE GLYCOL 3350 17 G PO PACK: 17.0000 g | PACK | Freq: Every day | ORAL | Status: DC | PRN

## 2018-07-03 MED ORDER — SODIUM CHLORIDE 0.9 % IV SOLN
INTRAVENOUS | Status: DC
  Administered 2018-07-03: 13:00:00 via INTRAVENOUS

## 2018-07-03 MED ORDER — FENTANYL CITRATE (PF) 250 MCG/5ML IJ SOLN
INTRAMUSCULAR | Status: DC | PRN
  Administered 2018-07-03 (×2): 25 ug via INTRAVENOUS

## 2018-07-03 MED ORDER — FENTANYL CITRATE (PF) 250 MCG/5ML IJ SOLN
INTRAMUSCULAR | Status: AC
  Filled 2018-07-03: qty 5

## 2018-07-03 MED ORDER — ALPRAZOLAM 0.5 MG PO TABS
0.5000 mg | ORAL_TABLET | Freq: Three times a day (TID) | ORAL | Status: DC | PRN
  Administered 2018-07-03 – 2018-07-05 (×3): 0.5 mg via ORAL
  Filled 2018-07-03 (×3): qty 1

## 2018-07-03 MED ORDER — METHOCARBAMOL 500 MG PO TABS
500.0000 mg | ORAL_TABLET | Freq: Four times a day (QID) | ORAL | Status: DC | PRN
  Administered 2018-07-03 – 2018-07-05 (×2): 500 mg via ORAL
  Filled 2018-07-03 (×2): qty 1

## 2018-07-03 MED ORDER — 0.9 % SODIUM CHLORIDE (POUR BTL) OPTIME
TOPICAL | Status: DC | PRN
  Administered 2018-07-03: 1000 mL

## 2018-07-03 MED ORDER — HYDROMORPHONE HCL 1 MG/ML IJ SOLN
0.5000 mg | INTRAMUSCULAR | Status: DC | PRN
  Administered 2018-07-03 – 2018-07-04 (×3): 1 mg via INTRAVENOUS
  Filled 2018-07-03 (×3): qty 1

## 2018-07-03 MED ORDER — MAGNESIUM CITRATE PO SOLN: 1.0000 | Freq: Once | ORAL | Status: DC | PRN

## 2018-07-03 MED ORDER — ONDANSETRON HCL 4 MG/2ML IJ SOLN: 4.0000 mg | Freq: Four times a day (QID) | INTRAMUSCULAR | Status: DC | PRN

## 2018-07-03 MED ORDER — ARTIFICIAL TEARS OPHTHALMIC OINT
TOPICAL_OINTMENT | OPHTHALMIC | Status: AC
  Filled 2018-07-03: qty 3.5

## 2018-07-03 MED ORDER — PROPOFOL 500 MG/50ML IV EMUL
INTRAVENOUS | Status: DC | PRN
  Administered 2018-07-03: 100 ug/kg/min via INTRAVENOUS

## 2018-07-03 MED ORDER — LACTATED RINGERS IV SOLN
INTRAVENOUS | Status: DC
  Administered 2018-07-03: 09:00:00 via INTRAVENOUS

## 2018-07-03 MED ORDER — ACETAMINOPHEN 325 MG PO TABS: 325.0000 mg | ORAL_TABLET | Freq: Four times a day (QID) | ORAL | Status: DC | PRN

## 2018-07-03 MED ORDER — OXYCODONE HCL 5 MG PO TABS
5.0000 mg | ORAL_TABLET | ORAL | Status: DC | PRN
  Administered 2018-07-03 – 2018-07-04 (×3): 10 mg via ORAL
  Administered 2018-07-05 (×2): 5 mg via ORAL
  Filled 2018-07-03 (×6): qty 2
  Filled 2018-07-03 (×2): qty 1

## 2018-07-03 MED ORDER — MIDAZOLAM HCL 2 MG/2ML IJ SOLN
INTRAMUSCULAR | Status: AC
  Administered 2018-07-03: 2 mg via INTRAVENOUS
  Filled 2018-07-03: qty 2

## 2018-07-03 MED ORDER — METOCLOPRAMIDE HCL 5 MG/ML IJ SOLN: 5.0000 mg | Freq: Three times a day (TID) | INTRAMUSCULAR | Status: DC | PRN

## 2018-07-03 SURGICAL SUPPLY — 30 items
BLADE SAW SGTL MED 73X18.5 STR (BLADE) ×1 IMPLANT
BLADE SURG 21 STRL SS (BLADE) ×2 IMPLANT
BNDG COHESIVE 4X5 TAN STRL (GAUZE/BANDAGES/DRESSINGS) ×2 IMPLANT
BNDG GAUZE ELAST 4 BULKY (GAUZE/BANDAGES/DRESSINGS) ×2 IMPLANT
COVER SURGICAL LIGHT HANDLE (MISCELLANEOUS) ×4 IMPLANT
COVER WAND RF STERILE (DRAPES) ×2 IMPLANT
DRAPE U-SHAPE 47X51 STRL (DRAPES) ×4 IMPLANT
DRSG ADAPTIC 3X8 NADH LF (GAUZE/BANDAGES/DRESSINGS) ×2 IMPLANT
DRSG PAD ABDOMINAL 8X10 ST (GAUZE/BANDAGES/DRESSINGS) ×4 IMPLANT
DURAPREP 26ML APPLICATOR (WOUND CARE) ×2 IMPLANT
ELECT REM PT RETURN 9FT ADLT (ELECTROSURGICAL) ×2
ELECTRODE REM PT RTRN 9FT ADLT (ELECTROSURGICAL) ×1 IMPLANT
GAUZE SPONGE 4X4 12PLY STRL (GAUZE/BANDAGES/DRESSINGS) ×2 IMPLANT
GLOVE BIOGEL PI IND STRL 9 (GLOVE) ×1 IMPLANT
GLOVE BIOGEL PI INDICATOR 9 (GLOVE) ×1
GLOVE SURG ORTHO 9.0 STRL STRW (GLOVE) ×2 IMPLANT
GOWN STRL REUS W/ TWL XL LVL3 (GOWN DISPOSABLE) ×2 IMPLANT
GOWN STRL REUS W/TWL XL LVL3 (GOWN DISPOSABLE) ×2
KIT BASIN OR (CUSTOM PROCEDURE TRAY) ×2 IMPLANT
KIT PREVENA INCISION MGT 13 (CANNISTER) ×1 IMPLANT
KIT PREVENA INCISION MGT20CM45 (CANNISTER) ×1 IMPLANT
KIT TURNOVER KIT B (KITS) ×2 IMPLANT
NS IRRIG 1000ML POUR BTL (IV SOLUTION) ×2 IMPLANT
PACK ORTHO EXTREMITY (CUSTOM PROCEDURE TRAY) ×2 IMPLANT
PAD ARMBOARD 7.5X6 YLW CONV (MISCELLANEOUS) ×4 IMPLANT
STOCKINETTE IMPERVIOUS LG (DRAPES) IMPLANT
SUT ETHILON 2 0 PSLX (SUTURE) ×2 IMPLANT
TOWEL OR 17X26 10 PK STRL BLUE (TOWEL DISPOSABLE) ×2 IMPLANT
TUBE CONNECTING 12X1/4 (SUCTIONS) ×2 IMPLANT
YANKAUER SUCT BULB TIP NO VENT (SUCTIONS) ×2 IMPLANT

## 2018-07-03 NOTE — Anesthesia Procedure Notes (Signed)
Procedure Name: MAC Date/Time: 07/03/2018 10:32 AM Performed by: Jearld Pies, CRNA Pre-anesthesia Checklist: Patient identified, Emergency Drugs available, Suction available, Patient being monitored and Timeout performed Patient Re-evaluated:Patient Re-evaluated prior to induction Oxygen Delivery Method: Simple face mask Preoxygenation: Pre-oxygenation with 100% oxygen
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
## 2018-07-03 NOTE — Anesthesia Procedure Notes (Signed)
Anesthesia Regional Block: Ankle block   Pre-Anesthetic Checklist: ,, timeout performed, Correct Patient, Correct Site, Correct Laterality, Correct Procedure, Correct Position, site marked, Risks and benefits discussed, pre-op evaluation,  At surgeon's request and post-op pain management  Laterality: Right  Prep: Maximum Sterile Barrier Precautions used, chloraprep       Needles:  Injection technique: Single-shot  Needle Type: Echogenic Needle     Needle Length: 4cm  Needle Gauge: 25     Additional Needles:   (landmark technique)  Narrative:  Start time: 07/03/2018 9:43 AM End time: 07/03/2018 9:46 AM Injection made incrementally with aspirations every 5 mL.  Performed by: Personally  Anesthesiologist: Kaylyn Layer, MD  Additional Notes: Risks, benefits, and alternative discussed. Patient gave consent for procedure. Patient prepped and draped in sterile fashion. Sedation administered, patient remains easily responsive to voice. Local anesthetic given in 5cc increments with no signs or symptoms of intravascular injection. No pain or paraesthesias with injection. Patient monitored throughout procedure with signs of LAST or immediate complications. Tolerated well.  Amalia Greenhouse, MD

## 2018-07-03 NOTE — Anesthesia Postprocedure Evaluation (Signed)
Anesthesia Post Note  Patient: Angela [name]  Procedure(s) Performed: RIGHT FOOT 5TH RAY AMPUTATION (Right Foot)     Patient location during evaluation: PACU Anesthesia Type: Regional Level of consciousness: awake and alert Pain management: pain level controlled Vital Signs Assessment: post-procedure vital signs reviewed and stable Respiratory status: spontaneous breathing, nonlabored ventilation and respiratory function stable Cardiovascular status: blood pressure returned to baseline and stable Postop Assessment: no apparent nausea or vomiting Anesthetic complications: no    Last Vitals:  Vitals:   07/03/18 1144 07/03/18 1159  BP: 107/62 104/62  Pulse: (!) 54 66  Resp: 14 20  Temp: 36.5 C 36.8 C  SpO2: 98% 100%    Last Pain:  Vitals:   07/03/18 1159  TempSrc: Oral  PainSc:                  Kaylyn Layer

## 2018-07-03 NOTE — Progress Notes (Signed)
ID PROGRESS NOTE  Since patient underwent 5th ray amputation, she can stop Iv abtx on 1/5. Roughly 48 hr post amputation. No need for ID follow up unless needed.  Duke Salvia Drue Second MD MPH [facility] [phone]

## 2018-07-03 NOTE — Transfer of Care (Signed)
Immediate Anesthesia Transfer of Care Note  Patient: Angela [name]  Procedure(s) Performed: RIGHT FOOT 5TH RAY AMPUTATION (Right Foot)  Patient Location: PACU  Anesthesia Type:MAC and Regional  Level of Consciousness: awake, alert  and oriented  Airway & Oxygen Therapy: Patient Spontanous Breathing on room air  Post-op Assessment: Report given to RN and Post -op Vital signs reviewed and stable  Post vital signs: Reviewed and stable  Last Vitals:  Vitals Value Taken Time  BP 101/61 07/03/2018 11:15 AM  Temp    Pulse 68 07/03/2018 11:15 AM  Resp 15 07/03/2018 11:15 AM  SpO2 99 % 07/03/2018 11:15 AM  Vitals shown include unvalidated device data.  Last Pain:  Vitals:   07/03/18 0950  TempSrc:   PainSc: 0-No pain      Patients Stated Pain Goal: 5 (03/47/42 5956)  Complications: No apparent anesthesia complications

## 2018-07-03 NOTE — Progress Notes (Signed)
Orthopedic Tech Progress Note Patient Details:  Angela [name] Apr 02, 2001 [id number]  Ortho Devices Type of Ortho Device: Postop shoe/boot Ortho Device/Splint Location: RFE Ortho Device/Splint Interventions: Ordered, Application, Adjustment   Post Interventions Patient Tolerated: Well Instructions Provided: Care of device   Rosezella Kronick J Terryn Rosenkranz 07/03/2018, 2:51 PM

## 2018-07-03 NOTE — Interval H&P Note (Signed)
History and Physical Interval Note:  07/03/2018 6:47 AM  Angela [name]  has presented today for surgery, with the diagnosis of Osteomyelitis Abscess Right Foot  The various methods of treatment have been discussed with the patient and family. After consideration of risks, benefits and other options for treatment, the patient has consented to  Procedure(s): RIGHT FOOT 5TH RAY AMPUTATION (Right) as a surgical intervention .  The patient's history has been reviewed, patient examined, no change in status, stable for surgery.  I have reviewed the patient's chart and labs.  Questions were answered to the patient's satisfaction.     Nadara Mustard

## 2018-07-03 NOTE — Op Note (Signed)
07/03/2018  11:07 AM  PATIENT:  Angela [name]    PRE-OPERATIVE DIAGNOSIS:  Osteomyelitis Abscess Right Foot  POST-OPERATIVE DIAGNOSIS:  Same  PROCEDURE:  RIGHT FOOT 5TH RAY AMPUTATION, local tissue rearrangement for wound closure 3 x 9 cm. Application of  13 cm Praveena wound VAC.    SURGEON:  Newt Minion, MD  PHYSICIAN ASSISTANT:None ANESTHESIA:   General  PREOPERATIVE INDICATIONS:  [name] is a  18 y.o. female with a diagnosis of Osteomyelitis Abscess Right Foot who failed conservative measures and elected for surgical management.    The risks benefits and alternatives were discussed with the patient preoperatively including but not limited to the risks of infection, bleeding, nerve injury, cardiopulmonary complications, the need for revision surgery, among others, and the patient was willing to proceed.  OPERATIVE IMPLANTS: Praveena wound VAC.  _0 @  OPERATIVE FINDINGS: Small abscess around the metatarsal head soft tissue was removed adjacent to the abscess  OPERATIVE PROCEDURE: Patient was brought the operating room underwent a MAC anesthetic after an ankle block.  After adequate levels anesthesia were obtained patient's right lower extremity was prepped using DuraPrep draped into a sterile field a timeout was called.  An elliptical incision was made around the ulcer and fifth ray extending up to the base of the fifth metatarsal.  There was an abscess at the MTP joint.  The toe abscess and fifth metatarsal were resected in a block of tissue.  The fifth metatarsal was resected through the neck beveled plantarly.  A 21 blade knife was then used to further debride the tissue that was adjacent to the abscess.  The wound was irrigated with normal saline electrocautery was used for hemostasis.  Local tissue rearrangement was used to close the wound 3 x 9 cm.  A 13 cm Praveena wound VAC was applied this had a good suction fit this was covered with Coban.  Patient was taken  to the PACU in stable condition.   DISCHARGE PLANNING:  Antibiotic duration: Continue IV antibiotics for 24 hours postoperatively  Weightbearing: Nonweightbearing on the right  Pain medication: Opioid pathway ordered  Dressing care/ Wound VAC: Leave wound VAC in place for 1 week  Ambulatory devices: Walker  Discharge to: Patient will need discharge to home with home health physical therapy for gait training.  Follow-up: In the office 1 week post operative.

## 2018-07-03 NOTE — Progress Notes (Signed)
Orthopedic Tech Progress Note Patient Details:  Angela [name] 2000-09-04 [id number]  Ortho Devices Type of Ortho Device: Postop shoe/boot Ortho Device/Splint Location: RFE Ortho Device/Splint Interventions: Ordered, Application, Adjustment   Post Interventions Patient Tolerated: Well Instructions Provided: Care of device   Hanna Ra J Velinda Wrobel 07/03/2018, 2:54 PM

## 2018-07-04 ENCOUNTER — Encounter (HOSPITAL_COMMUNITY): Payer: Self-pay | Admitting: Orthopedic Surgery

## 2018-07-04 LAB — BASIC METABOLIC PANEL
ANION GAP: 8 (ref 5–15)
BUN: 7 mg/dL (ref 6–20)
CO2: 26 mmol/L (ref 22–32)
Calcium: 8.8 mg/dL — ABNORMAL LOW (ref 8.9–10.3)
Chloride: 103 mmol/L (ref 98–111)
Creatinine, Ser: 0.71 mg/dL (ref 0.44–1.00)
GFR calc Af Amer: >60 mL/min (ref >60)
Glucose, Bld: 111 mg/dL — ABNORMAL HIGH (ref 70–99)
Potassium: 3.5 mmol/L (ref 3.5–5.1)
Sodium: 137 mmol/L (ref 135–145)

## 2018-07-04 MED ORDER — POTASSIUM CHLORIDE CRYS ER 20 MEQ PO TBCR
40.0000 meq | EXTENDED_RELEASE_TABLET | Freq: Once | ORAL | Status: AC
  Administered 2018-07-04: 40 meq via ORAL
  Filled 2018-07-04: qty 2

## 2018-07-04 NOTE — Social Work (Signed)
CSW acknowledging consult for access to medications at discharge.  For medication access please consult RN Case Management.   CSW signing off. Please consult if any additional needs arise.  Shantel Wesely, MSW, LCSWA [facility] [phone]     

## 2018-07-04 NOTE — Plan of Care (Signed)
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
## 2018-07-04 NOTE — Progress Notes (Signed)
Patient ID: Angela [name], female   DOB: Nov 01, 2000, 18 y.o.   MRN: [id number] Postoperative day 1 right fifth ray amputation.  There is no drainage in the wound VAC canister.  May be discharged to home from an orthopedic standpoint once she is safe with transfers.  I will follow-up in the office in 1 week.  She is to continue the wound VAC dressing and this will be changed in the office.

## 2018-07-05 MED ORDER — OXYCODONE HCL 5 MG PO TABS: 5.0000 mg | ORAL_TABLET | ORAL | 0 refills | Status: DC | PRN
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
## 2018-07-05 NOTE — Plan of Care (Signed)
  Problem: Education: Goal: Knowledge of General Education information will improve Description Including pain rating scale, medication(s)/side effects and non-pharmacologic comfort measures Outcome: Progressing   Problem: Clinical Measurements: Goal: Ability to maintain clinical measurements within normal limits will improve Outcome: Progressing   Problem: Activity: Goal: Risk for activity intolerance will decrease Outcome: Progressing   Problem: Skin Integrity: Goal: Risk for impaired skin integrity will decrease Outcome: Progressing   

## 2018-07-05 NOTE — Progress Notes (Signed)
Angela [name] to be D/C'd Home per MD order.  Discussed prescriptions and follow up appointments with the patient. Prescriptions given to patient, medication list explained in detail. Pt verbalized understanding.  Allergies as of 07/05/2018      Reactions   Vancomycin Rash   Had red itchy rash of face and neck   Latex Rash      Medication List    TAKE these medications   amitriptyline 50 MG tablet Commonly known as:  ELAVIL Take 50 mg by mouth at bedtime.   baclofen 10 MG tablet Commonly known as:  LIORESAL Take 10 mg by mouth at bedtime.   escitalopram 10 MG tablet Commonly known as:  LEXAPRO Take 10 mg by mouth daily.   gabapentin 600 MG tablet Commonly known as:  NEURONTIN Take 1 tablet by mouth 3 (three) times daily.   norethindrone-ethinyl estradiol 1-20 MG-MCG tablet Commonly known as:  JUNEL FE 1/20 Take 1 tablet by mouth daily.   oxyCODONE 5 MG immediate release tablet Commonly known as:  Oxy IR/ROXICODONE Take 1 tablet (5 mg total) by mouth every 4 (four) hours as needed for severe pain (pain score 7-10).            Discharge Care Instructions  (From admission, onward)         Start     Ordered   07/03/18 0000  Non weight bearing    Question Answer Comment  Laterality right   Extremity Lower      07/03/18 1102          Vitals:   07/04/18 2035 07/05/18 0452  BP: 123/85 110/67  Pulse: 92 71  Resp: 18 19  Temp: 98.2 F (36.8 C) 98.4 F (36.9 C)  SpO2: 100% 99%    Skin clean, dry and intact without evidence of skin break down, no evidence of skin tears noted. Prevena wound vac is clean, dry, and intact. Right ankle is wrapped with coban and is clean, dry, intact and secure. IV catheter discontinued intact. Site without signs and symptoms of complications. Dressing and pressure applied. Pt denies pain at this time. No complaints noted.  An After Visit Summary and prescriptions were printed and given to the patient. Patient escorted via WC,  and D/C home via private auto.  Angela Maree Ainley RN

## 2018-07-06 LAB — CULTURE, BLOOD (ROUTINE X 2)
Culture: NO GROWTH
Culture: NO GROWTH
Special Requests: ADEQUATE
Special Requests: ADEQUATE

## 2018-07-09 ENCOUNTER — Encounter (INDEPENDENT_AMBULATORY_CARE_PROVIDER_SITE_OTHER): Payer: Self-pay | Admitting: Physician Assistant

## 2018-07-09 ENCOUNTER — Ambulatory Visit (INDEPENDENT_AMBULATORY_CARE_PROVIDER_SITE_OTHER): Payer: BLUE CROSS/BLUE SHIELD | Admitting: Physician Assistant

## 2018-07-09 VITALS — Ht 66.0 in | Wt 203.3 lb

## 2018-07-09 DIAGNOSIS — Z89421 Acquired absence of other right toe(s): Secondary | ICD-10-CM

## 2018-07-09 DIAGNOSIS — G8220 Paraplegia, unspecified: Secondary | ICD-10-CM

## 2018-07-09 MED ORDER — OXYCODONE-ACETAMINOPHEN 5-325 MG PO TABS: 1.0000 | ORAL_TABLET | Freq: Four times a day (QID) | ORAL | 0 refills | Status: DC | PRN
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
## 2018-07-10 ENCOUNTER — Telehealth (INDEPENDENT_AMBULATORY_CARE_PROVIDER_SITE_OTHER): Payer: Self-pay | Admitting: Orthopedic Surgery

## 2018-07-10 NOTE — Telephone Encounter (Signed)
May wash the foot in the shower with Dial soap and water and apply dry gauze to the incisional area and Ace wrap. Do not soak the foot

## 2018-07-10 NOTE — Telephone Encounter (Signed)
Is she being sch for surgery? See message below and let me know what I need to do.

## 2018-07-10 NOTE — Telephone Encounter (Signed)
Patient called needing to know how to care for her right foot after the toe amputation. Patient also want to know if she can take a shower and what can she cover her foot with. Patient asked if she can get a wound care nurse to come to her home and care for her wound. The number to contact patient is [phone]

## 2018-07-11 ENCOUNTER — Encounter (INDEPENDENT_AMBULATORY_CARE_PROVIDER_SITE_OTHER): Payer: Self-pay | Admitting: Physician Assistant

## 2018-07-13 NOTE — Telephone Encounter (Signed)
Talked with patient and advised her of message below per [facility].

## 2018-07-16 ENCOUNTER — Encounter (INDEPENDENT_AMBULATORY_CARE_PROVIDER_SITE_OTHER): Payer: Self-pay | Admitting: Orthopedic Surgery

## 2018-07-16 ENCOUNTER — Ambulatory Visit (INDEPENDENT_AMBULATORY_CARE_PROVIDER_SITE_OTHER): Payer: BLUE CROSS/BLUE SHIELD | Admitting: Physician Assistant

## 2018-07-16 VITALS — Ht 66.0 in | Wt 203.3 lb

## 2018-07-16 DIAGNOSIS — Z89421 Acquired absence of other right toe(s): Secondary | ICD-10-CM

## 2018-07-16 DIAGNOSIS — G8220 Paraplegia, unspecified: Secondary | ICD-10-CM

## 2018-07-16 MED ORDER — OXYCODONE-ACETAMINOPHEN 5-325 MG PO TABS: 1.0000 | ORAL_TABLET | Freq: Four times a day (QID) | ORAL | 0 refills | Status: AC | PRN
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
## 2018-07-17 ENCOUNTER — Encounter (INDEPENDENT_AMBULATORY_CARE_PROVIDER_SITE_OTHER): Payer: Self-pay | Admitting: Physician Assistant

## 2018-07-24 ENCOUNTER — Emergency Department (HOSPITAL_COMMUNITY)
Admission: EM | Admit: 2018-07-24 | Discharge: 2018-07-25 | Disposition: A | Discharge status: Home / Self Care | Payer: BLUE CROSS/BLUE SHIELD | Attending: Emergency Medicine | Admitting: Emergency Medicine

## 2018-07-24 ENCOUNTER — Emergency Department (HOSPITAL_COMMUNITY): Payer: BLUE CROSS/BLUE SHIELD

## 2018-07-24 ENCOUNTER — Other Ambulatory Visit: Payer: Self-pay

## 2018-07-24 ENCOUNTER — Encounter (HOSPITAL_COMMUNITY): Payer: Self-pay | Admitting: Emergency Medicine

## 2018-07-24 DIAGNOSIS — Z89421 Acquired absence of other right toe(s): Secondary | ICD-10-CM | POA: Diagnosis not present

## 2018-07-24 DIAGNOSIS — B9789 Other viral agents as the cause of diseases classified elsewhere: Secondary | ICD-10-CM | POA: Insufficient documentation

## 2018-07-24 DIAGNOSIS — R05 Cough: Secondary | ICD-10-CM | POA: Diagnosis not present

## 2018-07-24 DIAGNOSIS — Z79899 Other long term (current) drug therapy: Secondary | ICD-10-CM | POA: Diagnosis not present

## 2018-07-24 DIAGNOSIS — J069 Acute upper respiratory infection, unspecified: Secondary | ICD-10-CM | POA: Diagnosis not present

## 2018-07-24 DIAGNOSIS — Z91040 Latex allergy status: Secondary | ICD-10-CM | POA: Diagnosis not present

## 2018-07-24 DIAGNOSIS — G8220 Paraplegia, unspecified: Secondary | ICD-10-CM | POA: Insufficient documentation

## 2018-07-24 LAB — CBC WITH DIFFERENTIAL/PLATELET
ABS IMMATURE GRANULOCYTES: 0.01 10*3/uL (ref 0.00–0.07)
Basophils Absolute: 0 10*3/uL (ref 0.0–0.1)
Basophils Relative: 0 %
Eosinophils Absolute: 0.1 10*3/uL (ref 0.0–0.5)
Eosinophils Relative: 1 %
HCT: 39.6 % (ref 36.0–46.0)
Hemoglobin: 12.4 g/dL (ref 12.0–15.0)
Immature Granulocytes: 0 %
Lymphocytes Relative: 14 %
Lymphs Abs: 0.8 10*3/uL (ref 0.7–4.0)
MCH: 27.3 pg (ref 26.0–34.0)
MCHC: 31.3 g/dL (ref 30.0–36.0)
MCV: 87.2 fL (ref 80.0–100.0)
MONOS PCT: 12 %
Monocytes Absolute: 0.7 10*3/uL (ref 0.1–1.0)
Neutro Abs: 4.2 10*3/uL (ref 1.7–7.7)
Neutrophils Relative %: 73 %
Platelets: 264 10*3/uL (ref 150–400)
RBC: 4.54 MIL/uL (ref 3.87–5.11)
RDW: 13.2 % (ref 11.5–15.5)
WBC: 5.9 10*3/uL (ref 4.0–10.5)
nRBC: 0 % (ref 0.0–0.2)

## 2018-07-24 LAB — BASIC METABOLIC PANEL
Anion gap: 10 (ref 5–15)
BUN: 11 mg/dL (ref 6–20)
CO2: 23 mmol/L (ref 22–32)
Calcium: 8.8 mg/dL — ABNORMAL LOW (ref 8.9–10.3)
Chloride: 103 mmol/L (ref 98–111)
Creatinine, Ser: 0.59 mg/dL (ref 0.44–1.00)
GFR calc Af Amer: >60 mL/min (ref >60)
GFR calc non Af Amer: >60 mL/min (ref >60)
GLUCOSE: 90 mg/dL (ref 70–99)
Potassium: 3.5 mmol/L (ref 3.5–5.1)
Sodium: 136 mmol/L (ref 135–145)

## 2018-07-24 LAB — I-STAT BETA HCG BLOOD, ED (MC, WL, AP ONLY)

## 2018-07-24 LAB — INFLUENZA PANEL BY PCR (TYPE A & B)
Influenza A By PCR: NEGATIVE
Influenza B By PCR: NEGATIVE

## 2018-07-24 LAB — LACTIC ACID, PLASMA: Lactic Acid, Venous: 1 mmol/L (ref 0.5–1.9)

## 2018-07-24 MED ORDER — ACETAMINOPHEN 500 MG PO TABS
1000.0000 mg | ORAL_TABLET | Freq: Once | ORAL | Status: AC
  Administered 2018-07-24: 1000 mg via ORAL
  Filled 2018-07-24: qty 2

## 2018-07-24 MED ORDER — KETOROLAC TROMETHAMINE 30 MG/ML IJ SOLN
30.0000 mg | Freq: Once | INTRAMUSCULAR | Status: AC
  Administered 2018-07-24: 30 mg via INTRAVENOUS
  Filled 2018-07-24: qty 1

## 2018-07-24 MED ORDER — SODIUM CHLORIDE 0.9 % IV BOLUS
1000.0000 mL | Freq: Once | INTRAVENOUS | Status: AC
  Administered 2018-07-24: 1000 mL via INTRAVENOUS

## 2018-07-24 NOTE — ED Triage Notes (Signed)
Pt has small toe on right foot remove Jan 3rd, still healing, some redness

## 2018-07-24 NOTE — ED Triage Notes (Signed)
Onset 2 days ago body aches, fever, cough, congestion, headache. Leg pain is the worse to to nerve damage.
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
## 2018-07-25 DIAGNOSIS — R05 Cough: Secondary | ICD-10-CM | POA: Diagnosis not present

## 2018-07-25 DIAGNOSIS — J069 Acute upper respiratory infection, unspecified: Secondary | ICD-10-CM | POA: Diagnosis not present

## 2018-07-25 MED ORDER — HYDROCODONE-ACETAMINOPHEN 5-325 MG PO TABS
1.0000 | ORAL_TABLET | Freq: Once | ORAL | Status: AC
  Administered 2018-07-25: 1 via ORAL
  Filled 2018-07-25: qty 1

## 2018-07-25 NOTE — ED Notes (Signed)
Pt ambulatory to waiting room. Pt verbalized understanding of discharge instructions.   

## 2018-07-25 NOTE — Discharge Instructions (Addendum)
You can take Tylenol or Ibuprofen as directed for pain. You can alternate Tylenol and Ibuprofen every 4 hours. If you take Tylenol at 1pm, then you can take Ibuprofen at 5pm. Then you can take Tylenol again at 9pm.   Make sure you are drinking plenty of fluids and staying hydrated.  Follow-up with your primary care doctor.  Return to the emergency department for any worsening fever despite medications, difficulty breathing, vomiting, any worsening pain in your legs, warmth or redness that extends up the leg or any other worsening or concerning symptoms.

## 2018-07-28 ENCOUNTER — Other Ambulatory Visit: Payer: Self-pay

## 2018-07-28 MED ORDER — NORETHIN ACE-ETH ESTRAD-FE 1-20 MG-MCG PO TABS: 1.0000 | ORAL_TABLET | Freq: Every day | ORAL | 0 refills | Status: DC

## 2018-07-28 NOTE — Telephone Encounter (Signed)
Appt scheduled; needs refill on bc.  [phone]  Pt aware refill eRx'd.

## 2018-08-06 ENCOUNTER — Ambulatory Visit (INDEPENDENT_AMBULATORY_CARE_PROVIDER_SITE_OTHER): Payer: BLUE CROSS/BLUE SHIELD

## 2018-08-06 ENCOUNTER — Ambulatory Visit (INDEPENDENT_AMBULATORY_CARE_PROVIDER_SITE_OTHER): Payer: BLUE CROSS/BLUE SHIELD | Admitting: Physician Assistant

## 2018-08-06 ENCOUNTER — Encounter (INDEPENDENT_AMBULATORY_CARE_PROVIDER_SITE_OTHER): Payer: Self-pay | Admitting: Physician Assistant

## 2018-08-06 VITALS — Ht 66.0 in | Wt 190.0 lb

## 2018-08-06 DIAGNOSIS — Z89421 Acquired absence of other right toe(s): Secondary | ICD-10-CM

## 2018-08-06 DIAGNOSIS — G8220 Paraplegia, unspecified: Secondary | ICD-10-CM

## 2018-08-06 MED ORDER — SULFAMETHOXAZOLE-TRIMETHOPRIM 800-160 MG PO TABS: 1.0000 | ORAL_TABLET | Freq: Two times a day (BID) | ORAL | 0 refills | Status: DC

## 2018-08-06 MED ORDER — OXYCODONE HCL 5 MG PO TABS: 5.0000 mg | ORAL_TABLET | Freq: Four times a day (QID) | ORAL | 0 refills | Status: DC | PRN
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
## 2018-08-10 DIAGNOSIS — H1031 Unspecified acute conjunctivitis, right eye: Secondary | ICD-10-CM | POA: Diagnosis not present

## 2018-08-10 DIAGNOSIS — J029 Acute pharyngitis, unspecified: Secondary | ICD-10-CM | POA: Diagnosis not present

## 2018-08-10 DIAGNOSIS — Z89421 Acquired absence of other right toe(s): Secondary | ICD-10-CM | POA: Diagnosis not present

## 2018-08-10 DIAGNOSIS — G8222 Paraplegia, incomplete: Secondary | ICD-10-CM | POA: Diagnosis not present

## 2018-08-10 DIAGNOSIS — J019 Acute sinusitis, unspecified: Secondary | ICD-10-CM | POA: Diagnosis not present

## 2018-08-12 ENCOUNTER — Ambulatory Visit (INDEPENDENT_AMBULATORY_CARE_PROVIDER_SITE_OTHER): Payer: BLUE CROSS/BLUE SHIELD | Admitting: Obstetrics and Gynecology

## 2018-08-12 ENCOUNTER — Encounter: Payer: Self-pay | Admitting: Obstetrics and Gynecology

## 2018-08-12 ENCOUNTER — Other Ambulatory Visit (HOSPITAL_COMMUNITY)
Admission: RE | Admit: 2018-08-12 | Discharge: 2018-08-12 | Disposition: A | Discharge status: Home / Self Care | Payer: BLUE CROSS/BLUE SHIELD | Source: Ambulatory Visit | Attending: Obstetrics and Gynecology | Admitting: Obstetrics and Gynecology

## 2018-08-12 VITALS — BP 110/78 | HR 95 | Ht 66.5 in | Wt 190.0 lb

## 2018-08-12 DIAGNOSIS — Z3202 Encounter for pregnancy test, result negative: Secondary | ICD-10-CM | POA: Diagnosis not present

## 2018-08-12 DIAGNOSIS — Z01419 Encounter for gynecological examination (general) (routine) without abnormal findings: Secondary | ICD-10-CM | POA: Diagnosis not present

## 2018-08-12 DIAGNOSIS — Z113 Encounter for screening for infections with a predominantly sexual mode of transmission: Secondary | ICD-10-CM | POA: Insufficient documentation

## 2018-08-12 DIAGNOSIS — Z3041 Encounter for surveillance of contraceptive pills: Secondary | ICD-10-CM

## 2018-08-12 DIAGNOSIS — N912 Amenorrhea, unspecified: Secondary | ICD-10-CM

## 2018-08-12 DIAGNOSIS — Z8041 Family history of malignant neoplasm of ovary: Secondary | ICD-10-CM | POA: Insufficient documentation

## 2018-08-12 LAB — POCT URINE PREGNANCY: Preg Test, Ur: NEGATIVE

## 2018-08-12 MED ORDER — NORETHIN ACE-ETH ESTRAD-FE 1-20 MG-MCG PO TABS: 1.0000 | ORAL_TABLET | Freq: Every day | ORAL | 3 refills | Status: DC

## 2018-08-12 NOTE — Patient Instructions (Signed)
I value your feedback and entrusting us with your care. If you get a [facility] patient survey, I would appreciate you taking the time to let us know about your experience today. Thank you! 

## 2018-08-12 NOTE — Addendum Note (Signed)
Addended by: Donnetta HailARROYO, Sally-Ann Cutbirth on: 08/12/2018 03:38 PM   Modules accepted: Orders
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
## 2018-08-13 ENCOUNTER — Ambulatory Visit (INDEPENDENT_AMBULATORY_CARE_PROVIDER_SITE_OTHER): Payer: BLUE CROSS/BLUE SHIELD | Admitting: Physician Assistant

## 2018-08-14 LAB — CERVICOVAGINAL ANCILLARY ONLY
Chlamydia: NEGATIVE
Neisseria Gonorrhea: NEGATIVE

## 2018-08-19 ENCOUNTER — Telehealth (INDEPENDENT_AMBULATORY_CARE_PROVIDER_SITE_OTHER): Payer: Self-pay | Admitting: Physician Assistant

## 2018-08-19 ENCOUNTER — Other Ambulatory Visit (INDEPENDENT_AMBULATORY_CARE_PROVIDER_SITE_OTHER): Payer: Self-pay

## 2018-08-19 DIAGNOSIS — Z89421 Acquired absence of other right toe(s): Secondary | ICD-10-CM

## 2018-08-19 DIAGNOSIS — G8220 Paraplegia, unspecified: Secondary | ICD-10-CM

## 2018-08-19 NOTE — Telephone Encounter (Signed)
Pt was notified of referral.

## 2018-08-19 NOTE — Telephone Encounter (Signed)
Patient called asked if she can be referred to outpatient (PT) Patient want to go to [facility] outpatient rehab in [place].  The number to contact patient is [phone]

## 2018-08-24 ENCOUNTER — Other Ambulatory Visit (INDEPENDENT_AMBULATORY_CARE_PROVIDER_SITE_OTHER): Payer: Self-pay

## 2018-08-25 ENCOUNTER — Encounter (INDEPENDENT_AMBULATORY_CARE_PROVIDER_SITE_OTHER): Payer: Self-pay | Admitting: Physician Assistant

## 2018-08-25 ENCOUNTER — Ambulatory Visit (INDEPENDENT_AMBULATORY_CARE_PROVIDER_SITE_OTHER): Payer: BLUE CROSS/BLUE SHIELD | Admitting: Physician Assistant

## 2018-08-25 VITALS — Ht 66.5 in | Wt 190.1 lb

## 2018-08-25 DIAGNOSIS — G8220 Paraplegia, unspecified: Secondary | ICD-10-CM

## 2018-08-25 DIAGNOSIS — Z89421 Acquired absence of other right toe(s): Secondary | ICD-10-CM
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
## 2018-09-21 ENCOUNTER — Telehealth (INDEPENDENT_AMBULATORY_CARE_PROVIDER_SITE_OTHER): Payer: Self-pay | Admitting: Radiology

## 2018-09-21 NOTE — Telephone Encounter (Signed)
I have called patient on both numbers neither one was not accepting voicemails, the other voicemail was not set up.  Do you have now or have you had in the past 7 days a fever and/or chills? Do you have now or have you had in the past 7 days a cough? Do you have now or have you had in the last 7 days nausea, vomiting or abdominal pain? Have you been exposed to anyone who has tested positive for COVID-19? Have you or anyone who lives with you traveled within the last month?

## 2018-09-22 ENCOUNTER — Ambulatory Visit (INDEPENDENT_AMBULATORY_CARE_PROVIDER_SITE_OTHER): Payer: BLUE CROSS/BLUE SHIELD | Admitting: Physician Assistant

## 2018-09-24 ENCOUNTER — Telehealth (INDEPENDENT_AMBULATORY_CARE_PROVIDER_SITE_OTHER): Payer: Self-pay | Admitting: *Deleted

## 2018-09-24 ENCOUNTER — Telehealth (INDEPENDENT_AMBULATORY_CARE_PROVIDER_SITE_OTHER): Payer: Self-pay | Admitting: Orthopedic Surgery

## 2018-09-24 NOTE — Telephone Encounter (Signed)
Called pt and asked pre-screening COVID-19 questions and pt answered no to all questions.  

## 2018-09-24 NOTE — Telephone Encounter (Signed)
Patient called advised she spoke with Jeani Hawking and was told the letter for (PT) was written for (OT) instead of (PT) Patient asked if she can pick up the corrected letter tomorrow when she come to her appointment? The number to contact patient is [phone]

## 2018-09-25 ENCOUNTER — Other Ambulatory Visit: Payer: Self-pay

## 2018-09-25 ENCOUNTER — Encounter (INDEPENDENT_AMBULATORY_CARE_PROVIDER_SITE_OTHER): Payer: Self-pay | Admitting: Physician Assistant

## 2018-09-25 ENCOUNTER — Ambulatory Visit (INDEPENDENT_AMBULATORY_CARE_PROVIDER_SITE_OTHER): Payer: BLUE CROSS/BLUE SHIELD | Admitting: Physician Assistant

## 2018-09-25 VITALS — Ht 66.5 in | Wt 190.2 lb

## 2018-09-25 DIAGNOSIS — G8220 Paraplegia, unspecified: Secondary | ICD-10-CM

## 2018-09-25 DIAGNOSIS — Z89421 Acquired absence of other right toe(s): Secondary | ICD-10-CM

## 2018-09-25 NOTE — Telephone Encounter (Signed)
Notation on printed chart. Will address at office visit today.
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
## 2018-10-22 ENCOUNTER — Other Ambulatory Visit: Payer: Self-pay

## 2018-10-22 ENCOUNTER — Ambulatory Visit (HOSPITAL_COMMUNITY): Payer: BLUE CROSS/BLUE SHIELD | Attending: Physician Assistant

## 2018-10-22 ENCOUNTER — Encounter (HOSPITAL_COMMUNITY): Payer: Self-pay

## 2018-10-22 DIAGNOSIS — R2689 Other abnormalities of gait and mobility: Secondary | ICD-10-CM | POA: Diagnosis not present

## 2018-10-22 DIAGNOSIS — M79671 Pain in right foot: Secondary | ICD-10-CM

## 2018-10-22 DIAGNOSIS — R2681 Unsteadiness on feet: Secondary | ICD-10-CM | POA: Diagnosis not present

## 2018-10-22 DIAGNOSIS — R262 Difficulty in walking, not elsewhere classified: Secondary | ICD-10-CM | POA: Diagnosis not present

## 2018-10-22 DIAGNOSIS — M6281 Muscle weakness (generalized): Secondary | ICD-10-CM

## 2018-10-23 ENCOUNTER — Telehealth (HOSPITAL_COMMUNITY): Payer: Self-pay | Admitting: Pediatrics
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
## 2018-10-23 NOTE — Telephone Encounter (Signed)
10/23/18  I spoke with her dad and let him know that I was cx this appt because we began authorization for her visits and would call her once we had that information

## 2018-10-26 ENCOUNTER — Ambulatory Visit (HOSPITAL_COMMUNITY): Payer: BLUE CROSS/BLUE SHIELD

## 2018-10-29 ENCOUNTER — Telehealth (HOSPITAL_COMMUNITY): Payer: Self-pay

## 2018-10-29 ENCOUNTER — Other Ambulatory Visit: Payer: Self-pay

## 2018-10-29 ENCOUNTER — Ambulatory Visit (HOSPITAL_COMMUNITY): Payer: BLUE CROSS/BLUE SHIELD

## 2018-10-29 ENCOUNTER — Encounter (HOSPITAL_COMMUNITY): Payer: Self-pay

## 2018-10-29 DIAGNOSIS — R2689 Other abnormalities of gait and mobility: Secondary | ICD-10-CM

## 2018-10-29 DIAGNOSIS — R2681 Unsteadiness on feet: Secondary | ICD-10-CM

## 2018-10-29 DIAGNOSIS — M79671 Pain in right foot: Secondary | ICD-10-CM | POA: Diagnosis not present

## 2018-10-29 DIAGNOSIS — R262 Difficulty in walking, not elsewhere classified: Secondary | ICD-10-CM

## 2018-10-29 DIAGNOSIS — M6281 Muscle weakness (generalized): Secondary | ICD-10-CM

## 2018-10-29 NOTE — Telephone Encounter (Signed)
Pt called to confrim apptment today and ins-advised she has 8 visit approved by [facility]. The ID number for BCBS was entered twice and I removed the second one. She has BCBS and Medicaid.
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
## 2018-11-02 ENCOUNTER — Ambulatory Visit (HOSPITAL_COMMUNITY): Payer: Medicaid Other

## 2018-11-02 ENCOUNTER — Telehealth (HOSPITAL_COMMUNITY): Payer: Self-pay | Admitting: Pediatrics

## 2018-11-02 NOTE — Telephone Encounter (Signed)
11/02/18  she thinks she has food poisoning and hasn't vomited since yesterday but thought it would be best to not come in today... I rescheduled for 5/5

## 2018-11-03 ENCOUNTER — Encounter (HOSPITAL_COMMUNITY): Payer: Self-pay | Admitting: Physical Therapy

## 2018-11-03 ENCOUNTER — Other Ambulatory Visit: Payer: Self-pay

## 2018-11-03 ENCOUNTER — Ambulatory Visit (HOSPITAL_COMMUNITY): Payer: Medicaid Other | Attending: Physician Assistant | Admitting: Physical Therapy

## 2018-11-03 DIAGNOSIS — R2689 Other abnormalities of gait and mobility: Secondary | ICD-10-CM | POA: Diagnosis present

## 2018-11-03 DIAGNOSIS — M6281 Muscle weakness (generalized): Secondary | ICD-10-CM | POA: Insufficient documentation

## 2018-11-03 DIAGNOSIS — R262 Difficulty in walking, not elsewhere classified: Secondary | ICD-10-CM | POA: Diagnosis present

## 2018-11-03 DIAGNOSIS — M79671 Pain in right foot: Secondary | ICD-10-CM | POA: Diagnosis present

## 2018-11-03 DIAGNOSIS — R2681 Unsteadiness on feet: Secondary | ICD-10-CM | POA: Insufficient documentation
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
## 2018-11-05 ENCOUNTER — Other Ambulatory Visit: Payer: Self-pay

## 2018-11-05 ENCOUNTER — Ambulatory Visit (HOSPITAL_COMMUNITY): Payer: Medicaid Other

## 2018-11-05 ENCOUNTER — Encounter (HOSPITAL_COMMUNITY): Payer: Self-pay

## 2018-11-05 DIAGNOSIS — R2689 Other abnormalities of gait and mobility: Secondary | ICD-10-CM

## 2018-11-05 DIAGNOSIS — M79671 Pain in right foot: Secondary | ICD-10-CM | POA: Diagnosis not present

## 2018-11-05 DIAGNOSIS — M6281 Muscle weakness (generalized): Secondary | ICD-10-CM

## 2018-11-05 DIAGNOSIS — R2681 Unsteadiness on feet: Secondary | ICD-10-CM

## 2018-11-05 DIAGNOSIS — R262 Difficulty in walking, not elsewhere classified: Secondary | ICD-10-CM
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
## 2018-11-09 ENCOUNTER — Telehealth (HOSPITAL_COMMUNITY): Payer: Self-pay

## 2018-11-09 ENCOUNTER — Ambulatory Visit (HOSPITAL_COMMUNITY): Payer: Medicaid Other

## 2018-11-09 ENCOUNTER — Telehealth (HOSPITAL_COMMUNITY): Payer: Self-pay | Admitting: Pediatrics

## 2018-11-09 NOTE — Telephone Encounter (Signed)
Confrimed approval from [employer] and Thurs visit and time. NF 11/09/18

## 2018-11-09 NOTE — Telephone Encounter (Signed)
[name] Confrimed approval  has not been approved from [employer] and we will let her know about  Thurs visit and time. NF 11/09/18

## 2018-11-09 NOTE — Telephone Encounter (Signed)
I have called patient  [name] and emailed her of my mistake- I was looking at [facility] approval not Medicaid. I advised her to call us back or call before she comes on Thursday. I told Sar

## 2018-11-09 NOTE — Telephone Encounter (Signed)
11/09/18  left patient a message that we didn't have authorization for more visits from [facility].  It has been submitted but just not approved as of this morning.

## 2018-11-11 ENCOUNTER — Telehealth (HOSPITAL_COMMUNITY): Payer: Self-pay

## 2018-11-11 NOTE — Telephone Encounter (Signed)
Medicaid approved 8 visits 5/12-12/07/2018 scanned in epic. Spoke [place]  to let patient know. NF 11/11/2018

## 2018-11-12 ENCOUNTER — Other Ambulatory Visit: Payer: Self-pay

## 2018-11-12 ENCOUNTER — Ambulatory Visit (HOSPITAL_COMMUNITY): Payer: Medicaid Other

## 2018-11-12 ENCOUNTER — Encounter (HOSPITAL_COMMUNITY): Payer: Self-pay

## 2018-11-12 DIAGNOSIS — M79671 Pain in right foot: Secondary | ICD-10-CM

## 2018-11-12 DIAGNOSIS — M6281 Muscle weakness (generalized): Secondary | ICD-10-CM

## 2018-11-12 DIAGNOSIS — R2681 Unsteadiness on feet: Secondary | ICD-10-CM

## 2018-11-12 DIAGNOSIS — R262 Difficulty in walking, not elsewhere classified: Secondary | ICD-10-CM

## 2018-11-12 DIAGNOSIS — R2689 Other abnormalities of gait and mobility: Secondary | ICD-10-CM
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
## 2018-11-16 ENCOUNTER — Ambulatory Visit (HOSPITAL_COMMUNITY): Payer: Medicaid Other

## 2018-11-16 DIAGNOSIS — S14109S Unspecified injury at unspecified level of cervical spinal cord, sequela: Secondary | ICD-10-CM | POA: Diagnosis not present

## 2018-11-19 ENCOUNTER — Other Ambulatory Visit: Payer: Self-pay

## 2018-11-19 ENCOUNTER — Ambulatory Visit (HOSPITAL_COMMUNITY): Payer: Medicaid Other

## 2018-11-19 ENCOUNTER — Encounter (HOSPITAL_COMMUNITY): Payer: Self-pay

## 2018-11-19 DIAGNOSIS — R2689 Other abnormalities of gait and mobility: Secondary | ICD-10-CM

## 2018-11-19 DIAGNOSIS — M6281 Muscle weakness (generalized): Secondary | ICD-10-CM

## 2018-11-19 DIAGNOSIS — M79671 Pain in right foot: Secondary | ICD-10-CM

## 2018-11-19 DIAGNOSIS — R2681 Unsteadiness on feet: Secondary | ICD-10-CM

## 2018-11-19 DIAGNOSIS — R262 Difficulty in walking, not elsewhere classified: Secondary | ICD-10-CM
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
## 2018-11-24 ENCOUNTER — Other Ambulatory Visit: Payer: Self-pay

## 2018-11-24 ENCOUNTER — Ambulatory Visit (HOSPITAL_COMMUNITY): Payer: Medicaid Other

## 2018-11-24 ENCOUNTER — Encounter (HOSPITAL_COMMUNITY): Payer: Self-pay

## 2018-11-24 DIAGNOSIS — R2681 Unsteadiness on feet: Secondary | ICD-10-CM

## 2018-11-24 DIAGNOSIS — M79671 Pain in right foot: Secondary | ICD-10-CM | POA: Diagnosis not present

## 2018-11-24 DIAGNOSIS — R2689 Other abnormalities of gait and mobility: Secondary | ICD-10-CM

## 2018-11-24 DIAGNOSIS — M6281 Muscle weakness (generalized): Secondary | ICD-10-CM

## 2018-11-24 DIAGNOSIS — R262 Difficulty in walking, not elsewhere classified: Secondary | ICD-10-CM
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
## 2018-11-26 ENCOUNTER — Encounter: Payer: Self-pay | Admitting: Orthopedic Surgery

## 2018-11-26 ENCOUNTER — Ambulatory Visit (HOSPITAL_COMMUNITY): Payer: Medicaid Other

## 2018-11-26 ENCOUNTER — Other Ambulatory Visit: Payer: Self-pay

## 2018-11-26 ENCOUNTER — Ambulatory Visit (INDEPENDENT_AMBULATORY_CARE_PROVIDER_SITE_OTHER): Payer: BLUE CROSS/BLUE SHIELD | Admitting: Orthopedic Surgery

## 2018-11-26 ENCOUNTER — Encounter (HOSPITAL_COMMUNITY): Payer: Self-pay

## 2018-11-26 ENCOUNTER — Ambulatory Visit: Payer: Self-pay | Admitting: Physician Assistant

## 2018-11-26 ENCOUNTER — Ambulatory Visit (INDEPENDENT_AMBULATORY_CARE_PROVIDER_SITE_OTHER): Payer: Medicaid Other

## 2018-11-26 VITALS — Ht 66.0 in | Wt 190.0 lb

## 2018-11-26 DIAGNOSIS — M79671 Pain in right foot: Secondary | ICD-10-CM

## 2018-11-26 DIAGNOSIS — Z89421 Acquired absence of other right toe(s): Secondary | ICD-10-CM

## 2018-11-26 DIAGNOSIS — R2689 Other abnormalities of gait and mobility: Secondary | ICD-10-CM

## 2018-11-26 DIAGNOSIS — R262 Difficulty in walking, not elsewhere classified: Secondary | ICD-10-CM

## 2018-11-26 DIAGNOSIS — M6281 Muscle weakness (generalized): Secondary | ICD-10-CM

## 2018-11-26 DIAGNOSIS — R2681 Unsteadiness on feet: Secondary | ICD-10-CM
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
## 2018-11-26 NOTE — Addendum Note (Signed)
Addended by: Jerl Santos on: 11/26/2018 02:20 PM   Modules accepted: Orders
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
## 2018-11-29 ENCOUNTER — Telehealth: Payer: Self-pay | Admitting: Internal Medicine

## 2018-11-29 NOTE — Telephone Encounter (Signed)
Unable to contact patient.  Neither number able to leave messages.

## 2018-11-30 ENCOUNTER — Other Ambulatory Visit: Payer: BLUE CROSS/BLUE SHIELD

## 2018-11-30 ENCOUNTER — Telehealth: Payer: Self-pay | Admitting: Hematology

## 2018-11-30 ENCOUNTER — Ambulatory Visit (HOSPITAL_COMMUNITY): Payer: Medicaid Other

## 2018-11-30 DIAGNOSIS — Z20822 Contact with and (suspected) exposure to covid-19: Secondary | ICD-10-CM

## 2018-11-30 NOTE — Telephone Encounter (Signed)
Patient was on potential exposure list / Patient scheduled and orders placed /

## 2018-12-01 LAB — NOVEL CORONAVIRUS, NAA: SARS-CoV-2, NAA: NOT DETECTED

## 2018-12-04 ENCOUNTER — Encounter (HOSPITAL_COMMUNITY): Payer: Self-pay

## 2018-12-04 ENCOUNTER — Other Ambulatory Visit: Payer: Self-pay

## 2018-12-04 ENCOUNTER — Ambulatory Visit (HOSPITAL_COMMUNITY): Payer: Medicaid Other | Attending: Physician Assistant

## 2018-12-04 DIAGNOSIS — R262 Difficulty in walking, not elsewhere classified: Secondary | ICD-10-CM | POA: Diagnosis present

## 2018-12-04 DIAGNOSIS — M79671 Pain in right foot: Secondary | ICD-10-CM

## 2018-12-04 DIAGNOSIS — R2689 Other abnormalities of gait and mobility: Secondary | ICD-10-CM | POA: Diagnosis present

## 2018-12-04 DIAGNOSIS — M6281 Muscle weakness (generalized): Secondary | ICD-10-CM | POA: Diagnosis present

## 2018-12-04 DIAGNOSIS — R2681 Unsteadiness on feet: Secondary | ICD-10-CM | POA: Diagnosis present
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
## 2018-12-07 ENCOUNTER — Ambulatory Visit (HOSPITAL_COMMUNITY): Payer: Medicaid Other

## 2018-12-07 ENCOUNTER — Encounter (HOSPITAL_COMMUNITY): Payer: Self-pay

## 2018-12-07 ENCOUNTER — Other Ambulatory Visit: Payer: Self-pay

## 2018-12-07 DIAGNOSIS — M79671 Pain in right foot: Secondary | ICD-10-CM

## 2018-12-07 DIAGNOSIS — R2681 Unsteadiness on feet: Secondary | ICD-10-CM

## 2018-12-07 DIAGNOSIS — R262 Difficulty in walking, not elsewhere classified: Secondary | ICD-10-CM

## 2018-12-07 DIAGNOSIS — R2689 Other abnormalities of gait and mobility: Secondary | ICD-10-CM

## 2018-12-07 DIAGNOSIS — M6281 Muscle weakness (generalized): Secondary | ICD-10-CM
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
## 2018-12-07 NOTE — Patient Instructions (Signed)
Access Code: [email or web address]  URL: [email or web address]  Date: 12/07/2018  Prepared by: Geraldine Solar   Exercises Single Leg Bridge - 10 reps - 3 sets - 1x daily - 7x weekly Narrow Half-Kneeling Balance - 10 reps - 3 sets - 1x daily - 7x weekly Half Kneeling Lift - 10 reps - 3 sets - 1x daily - 7x weekly Half Kneeling Diagonal Lift with Medicine Ball - 10 reps - 3 sets - 1x daily - 7x weekly Bird Dog - 10 reps - 3 sets - 1x daily - 7x weekly Bird Dog with Knee Taps - 10 reps - 3 sets - 1x daily - 7x weekly Side Plank on Knees - 10 reps - 3 sets - 1x daily - 7x weekly Side Plank on Elbow - 10 reps - 3 sets - 1x daily - 7x weekly Full Leg Press - 10 reps - 3 sets - 1x daily - 7x weekly Hamstring Curl with Weight Machine - 10 reps - 3 sets - 1x daily - 7x weekly Knee Extension with Weight Machine - 10 reps - 3 sets - 1x daily - 7x weekly Standing Single Leg Stance with Counter Support - 10 reps - 3 sets - 1x daily - 7x weekly Tandem Stance in Corner - 10 reps - 3 sets - as long as you can hold - 1x daily - 7x weekly Supine Bridge - 10 reps - 3 sets - 1x daily - 7x weekly Supine Active Straight Leg Raise - 10 reps - 3 sets - 1x daily - 7x weekly Seated Hamstring Curl with Anchored Resistance - 10 reps - 3 sets - 1x daily - 7x weekly Sidelying Hip Abduction - 10 reps - 3 sets - 1x daily - 7x weekly Seated Long Arc Quad - 10 reps - 3 sets - 1x daily - 7x weekly Sitting Knee Extension with Resistance - 10 reps - 3 sets - 1x daily - 7x weekly Sit to Stand with Counter Support - 10 reps - 3 sets - 1x daily - 7x weekly Prone Knee Flexion - 10 reps - 3 sets - 1x daily - 7x weekly Mini Squat with Counter Support - 10 reps - 3 sets - 1x daily - 7x weekly Quadruped Hip Extension Kicks - 10 reps - 3 sets - 1x daily - 7x weekly

## 2018-12-16 DIAGNOSIS — M25371 Other instability, right ankle: Secondary | ICD-10-CM | POA: Diagnosis not present

## 2018-12-16 DIAGNOSIS — Z89421 Acquired absence of other right toe(s): Secondary | ICD-10-CM | POA: Diagnosis not present

## 2018-12-16 DIAGNOSIS — M25372 Other instability, left ankle: Secondary | ICD-10-CM | POA: Diagnosis not present

## 2018-12-23 ENCOUNTER — Ambulatory Visit: Payer: BLUE CROSS/BLUE SHIELD | Admitting: Family

## 2019-01-08 IMAGING — CR DG CHEST 2V
2 series · 2 of 2 positions shown · non-contrast
Comparison: 07/03/2015

CLINICAL DATA: Cough for 3 days

EXAM:
CHEST  2 VIEW

[chest pa]
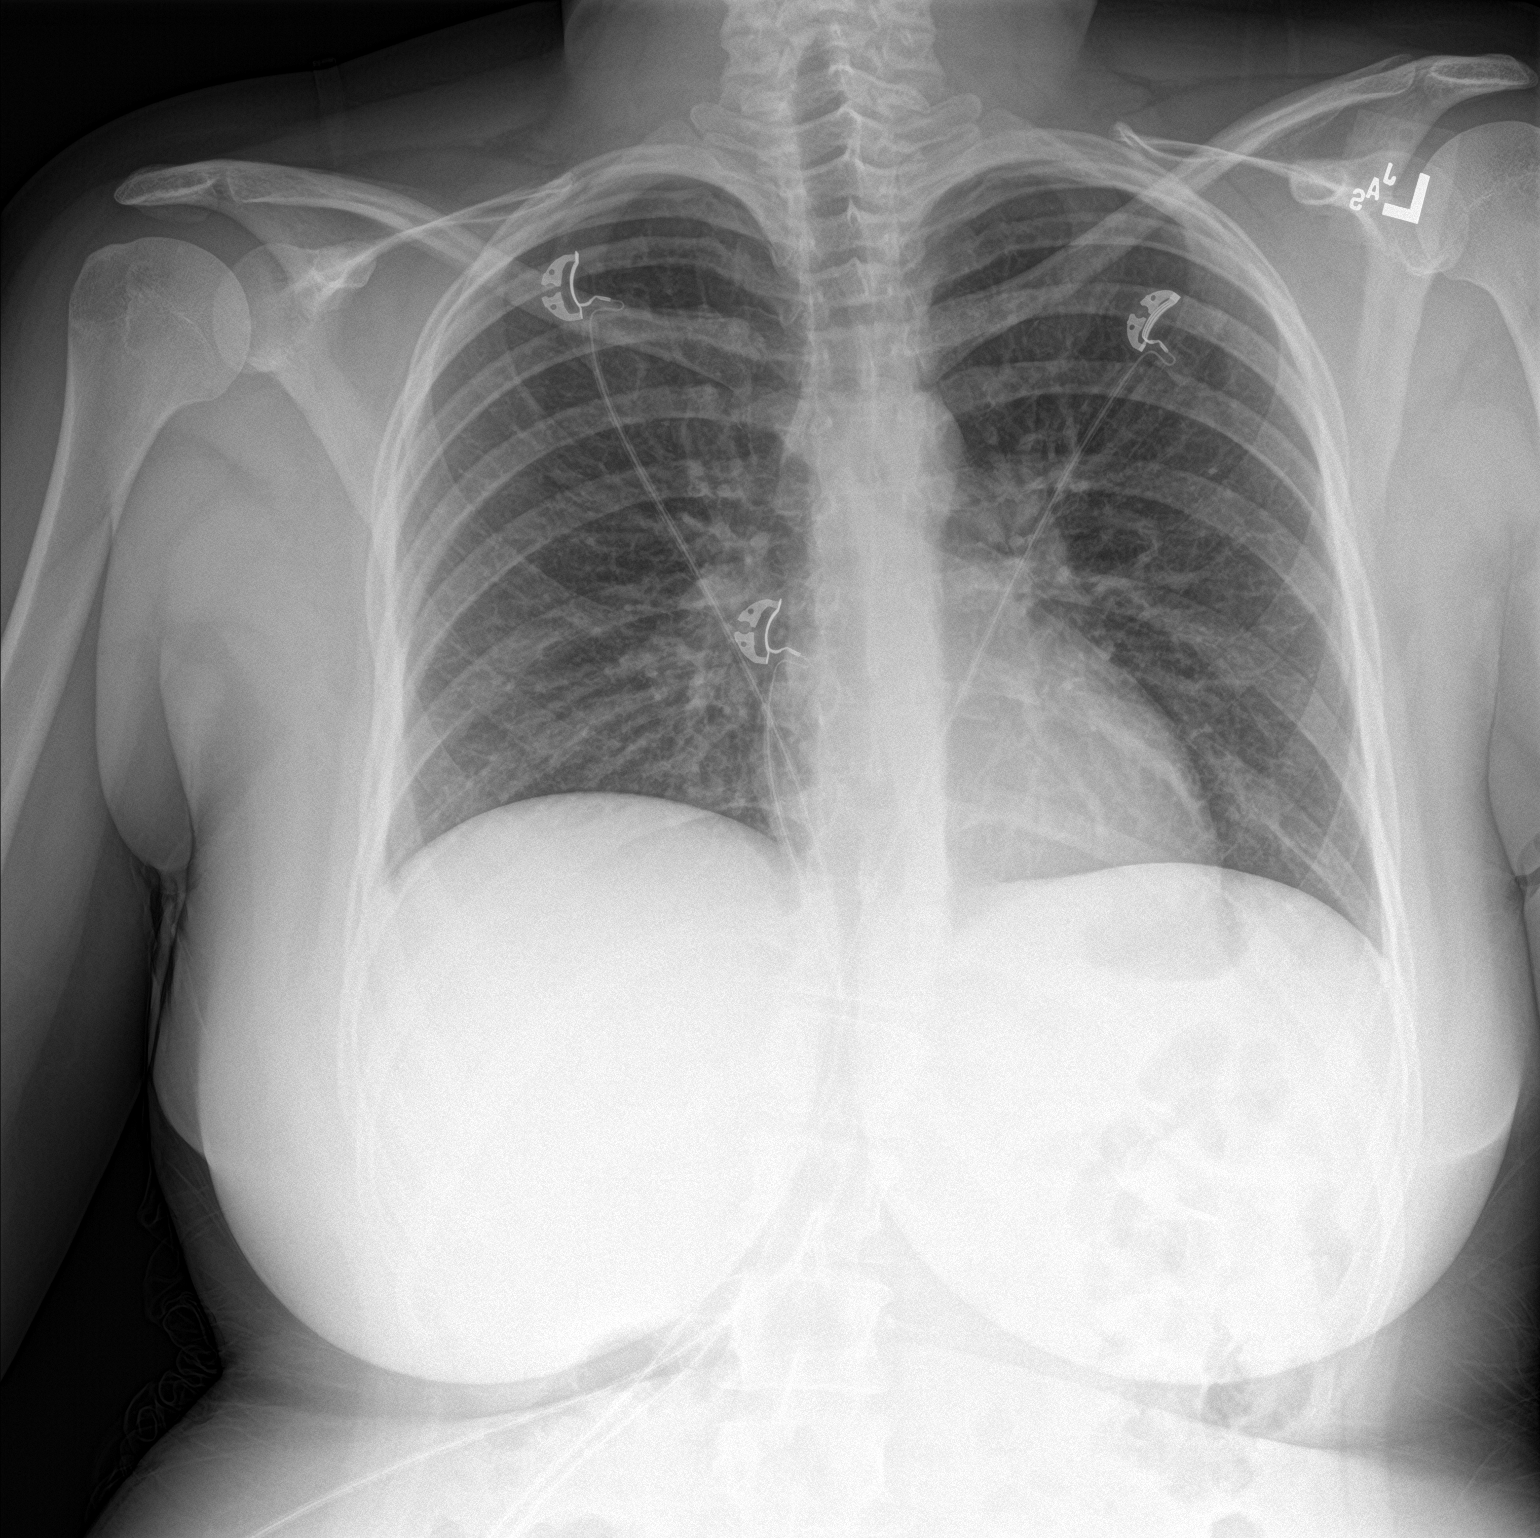

[chest lat]
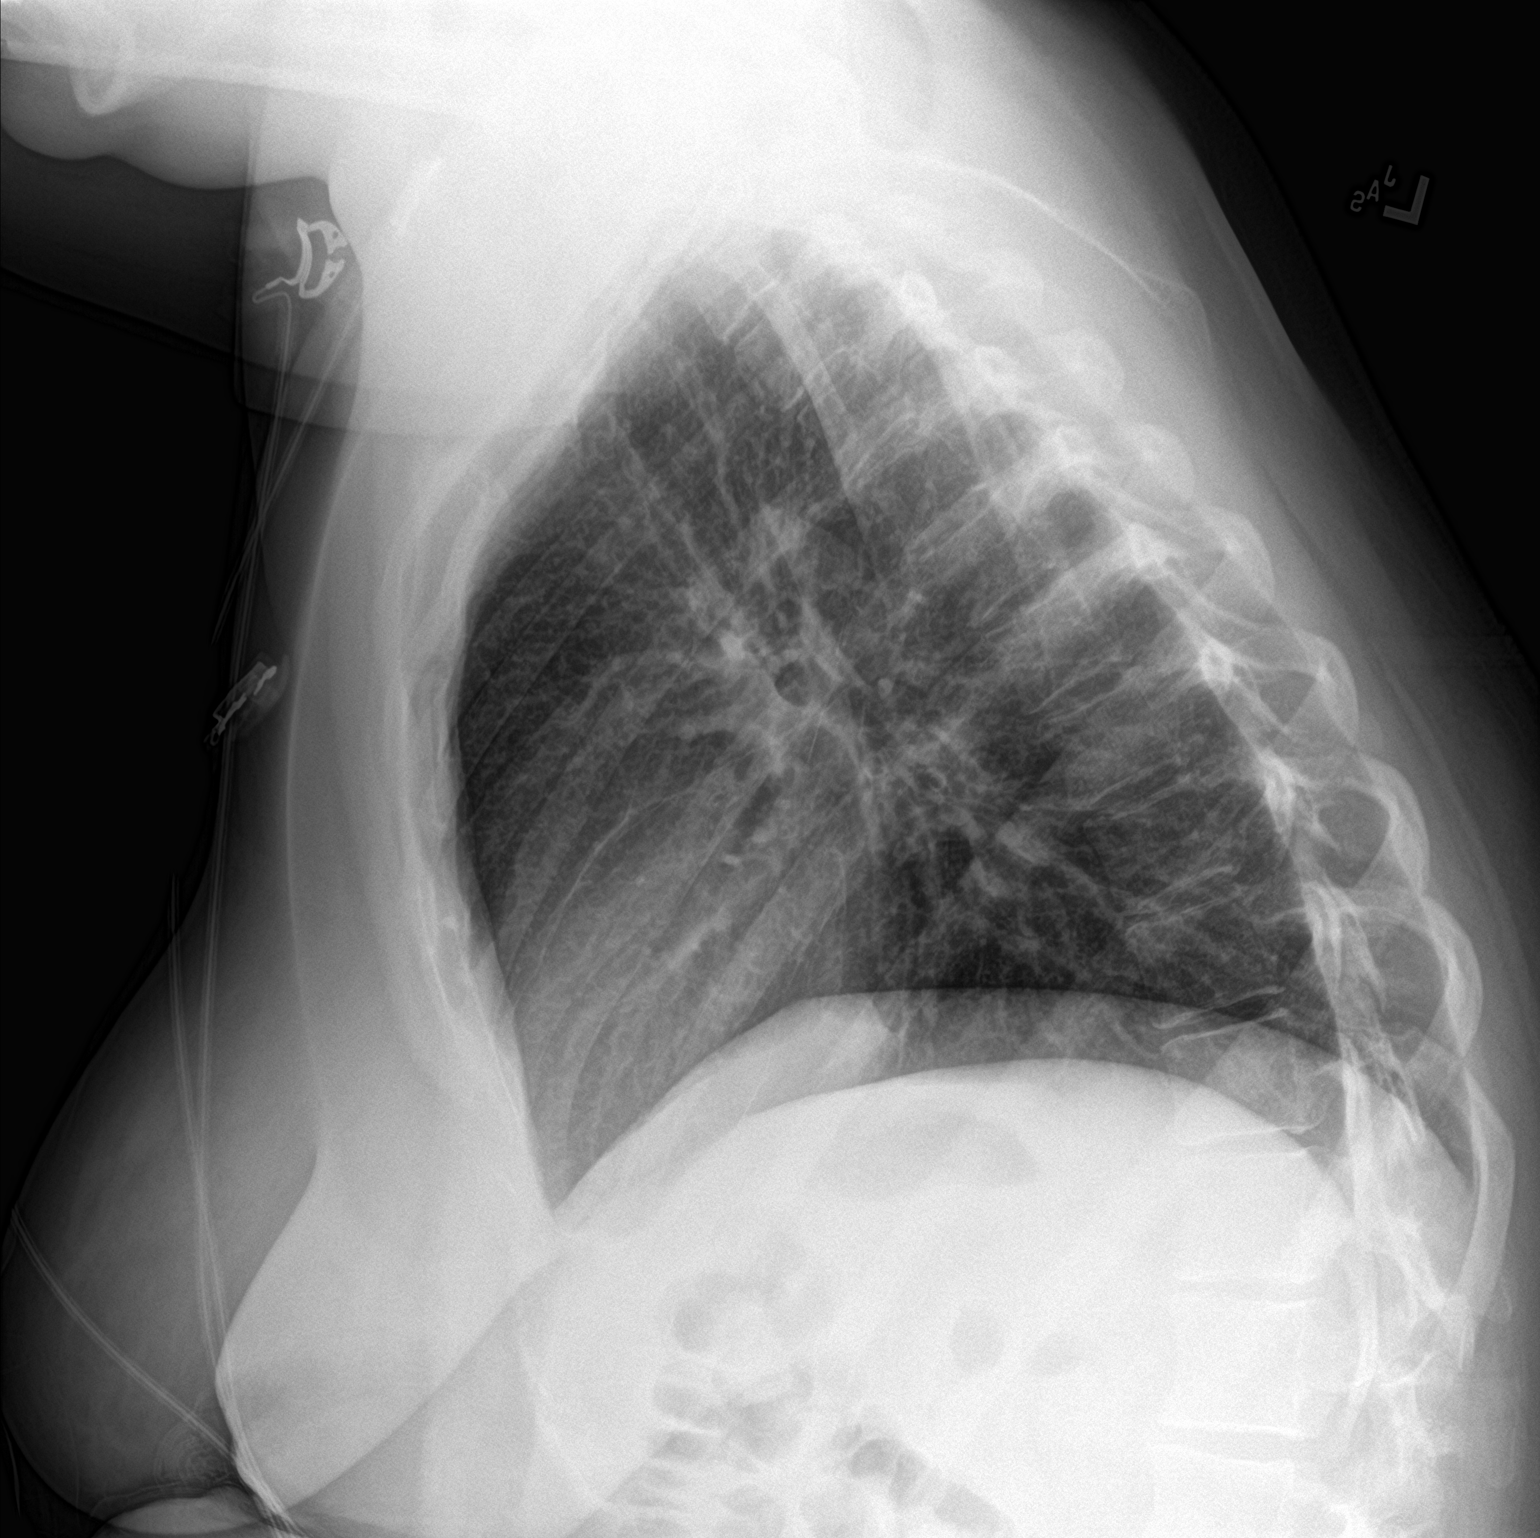

[2 of 2 positions shown; findings below may reference images not displayed]

FINDINGS: Shallow inspiration. Normal heart size and pulmonary vascularity. No
focal airspace disease or consolidation in the lungs. No blunting of
costophrenic angles. No pneumothorax. Mediastinal contours appear
intact.
IMPRESSION: No active cardiopulmonary disease.

## 2019-01-16 DIAGNOSIS — S14109S Unspecified injury at unspecified level of cervical spinal cord, sequela: Secondary | ICD-10-CM | POA: Diagnosis not present

## 2019-02-02 ENCOUNTER — Ambulatory Visit (INDEPENDENT_AMBULATORY_CARE_PROVIDER_SITE_OTHER): Payer: Medicaid Other | Admitting: Orthopedic Surgery

## 2019-02-02 ENCOUNTER — Encounter: Payer: Self-pay | Admitting: Orthopedic Surgery

## 2019-02-02 VITALS — Ht 66.0 in | Wt 190.3 lb

## 2019-02-02 DIAGNOSIS — S90812A Abrasion, left foot, initial encounter: Secondary | ICD-10-CM | POA: Diagnosis not present
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
## 2019-02-09 ENCOUNTER — Telehealth: Payer: Self-pay | Admitting: Orthopedic Surgery

## 2019-02-09 MED ORDER — DOXYCYCLINE HYCLATE 100 MG PO TABS: 100.0000 mg | ORAL_TABLET | Freq: Two times a day (BID) | ORAL | 0 refills | Status: DC

## 2019-02-09 NOTE — Telephone Encounter (Signed)
Did you want the pt to have ABX?

## 2019-02-09 NOTE — Telephone Encounter (Signed)
Pt called in said at her last appt 02-02-2019 Angela [name] told the pt he was going to prescribe her an antibiotic but the pharmacy doesn't have anything.   Please have that sent to [facility] in [place]

## 2019-03-10 DIAGNOSIS — S14109S Unspecified injury at unspecified level of cervical spinal cord, sequela: Secondary | ICD-10-CM | POA: Diagnosis not present

## 2019-03-30 DIAGNOSIS — J069 Acute upper respiratory infection, unspecified: Secondary | ICD-10-CM | POA: Diagnosis not present

## 2019-03-30 DIAGNOSIS — G8220 Paraplegia, unspecified: Secondary | ICD-10-CM | POA: Diagnosis not present

## 2019-04-02 ENCOUNTER — Other Ambulatory Visit: Payer: Self-pay

## 2019-04-02 DIAGNOSIS — Z20822 Contact with and (suspected) exposure to covid-19: Secondary | ICD-10-CM

## 2019-04-03 LAB — NOVEL CORONAVIRUS, NAA: SARS-CoV-2, NAA: NOT DETECTED

## 2019-04-04 ENCOUNTER — Encounter (HOSPITAL_COMMUNITY): Payer: Self-pay

## 2019-04-04 ENCOUNTER — Emergency Department (HOSPITAL_COMMUNITY): Payer: Medicaid Other

## 2019-04-04 ENCOUNTER — Other Ambulatory Visit: Payer: Self-pay

## 2019-04-04 ENCOUNTER — Emergency Department (HOSPITAL_COMMUNITY)
Admission: EM | Admit: 2019-04-04 | Discharge: 2019-04-04 | Disposition: A | Discharge status: Home / Self Care | Payer: Medicaid Other | Attending: Emergency Medicine | Admitting: Emergency Medicine

## 2019-04-04 DIAGNOSIS — Z79899 Other long term (current) drug therapy: Secondary | ICD-10-CM | POA: Diagnosis not present

## 2019-04-04 DIAGNOSIS — Y92410 Unspecified street and highway as the place of occurrence of the external cause: Secondary | ICD-10-CM | POA: Diagnosis not present

## 2019-04-04 DIAGNOSIS — R519 Headache, unspecified: Secondary | ICD-10-CM | POA: Insufficient documentation

## 2019-04-04 DIAGNOSIS — M5489 Other dorsalgia: Secondary | ICD-10-CM | POA: Diagnosis not present

## 2019-04-04 DIAGNOSIS — T671XXA Heat syncope, initial encounter: Secondary | ICD-10-CM | POA: Diagnosis not present

## 2019-04-04 DIAGNOSIS — S3992XA Unspecified injury of lower back, initial encounter: Secondary | ICD-10-CM | POA: Diagnosis not present

## 2019-04-04 DIAGNOSIS — Y9389 Activity, other specified: Secondary | ICD-10-CM | POA: Insufficient documentation

## 2019-04-04 DIAGNOSIS — G8220 Paraplegia, unspecified: Secondary | ICD-10-CM | POA: Diagnosis not present

## 2019-04-04 DIAGNOSIS — Z3202 Encounter for pregnancy test, result negative: Secondary | ICD-10-CM | POA: Diagnosis not present

## 2019-04-04 DIAGNOSIS — S199XXA Unspecified injury of neck, initial encounter: Secondary | ICD-10-CM | POA: Diagnosis not present

## 2019-04-04 DIAGNOSIS — Y999 Unspecified external cause status: Secondary | ICD-10-CM | POA: Insufficient documentation

## 2019-04-04 DIAGNOSIS — Z91040 Latex allergy status: Secondary | ICD-10-CM | POA: Diagnosis not present

## 2019-04-04 DIAGNOSIS — S0990XA Unspecified injury of head, initial encounter: Secondary | ICD-10-CM | POA: Diagnosis not present

## 2019-04-04 DIAGNOSIS — S299XXA Unspecified injury of thorax, initial encounter: Secondary | ICD-10-CM | POA: Diagnosis not present

## 2019-04-04 DIAGNOSIS — M542 Cervicalgia: Secondary | ICD-10-CM | POA: Diagnosis not present

## 2019-04-04 LAB — POC URINE PREG, ED: Preg Test, Ur: NEGATIVE

## 2019-04-04 MED ORDER — HYDROCODONE-ACETAMINOPHEN 5-325 MG PO TABS
1.0000 | ORAL_TABLET | Freq: Once | ORAL | Status: AC
  Administered 2019-04-04: 21:00:00 1 via ORAL
  Filled 2019-04-04: qty 1

## 2019-04-04 NOTE — ED Notes (Signed)
Pt requested blood drawn for pregnancy status

## 2019-04-04 NOTE — ED Notes (Signed)
Pt mentioned about noting some blood (brownish red) on toilet paper after wiping when she went to [facility] earlier after she returned from xray.
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
## 2019-04-04 NOTE — Discharge Instructions (Signed)
The CT scans and x-rays did not show any serious injuries to head neck or back.  You will likely be sore for a few days and gradually improved.  Use ice on the sore spots 3 times a day for 2 days, after that use heat.  Take Tylenol or Motrin as needed for pain.  Return here, if needed, for problems.
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
## 2019-04-08 DIAGNOSIS — N938 Other specified abnormal uterine and vaginal bleeding: Secondary | ICD-10-CM | POA: Diagnosis not present

## 2019-04-08 DIAGNOSIS — Z23 Encounter for immunization: Secondary | ICD-10-CM | POA: Diagnosis not present

## 2019-04-08 DIAGNOSIS — M545 Low back pain: Secondary | ICD-10-CM | POA: Diagnosis not present

## 2019-05-14 DIAGNOSIS — S14109S Unspecified injury at unspecified level of cervical spinal cord, sequela: Secondary | ICD-10-CM | POA: Diagnosis not present
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
## 2019-05-20 ENCOUNTER — Ambulatory Visit: Payer: Medicaid Other | Admitting: Obstetrics and Gynecology
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
## 2019-07-01 ENCOUNTER — Other Ambulatory Visit: Payer: Self-pay

## 2019-07-01 ENCOUNTER — Emergency Department (HOSPITAL_COMMUNITY)
Admission: EM | Admit: 2019-07-01 | Discharge: 2019-07-01 | Disposition: A | Discharge status: Home / Self Care | Payer: Medicaid Other | Attending: Emergency Medicine | Admitting: Emergency Medicine

## 2019-07-01 ENCOUNTER — Emergency Department (HOSPITAL_COMMUNITY): Payer: Medicaid Other

## 2019-07-01 ENCOUNTER — Encounter (HOSPITAL_COMMUNITY): Payer: Self-pay | Admitting: Emergency Medicine

## 2019-07-01 DIAGNOSIS — M79605 Pain in left leg: Secondary | ICD-10-CM | POA: Diagnosis not present

## 2019-07-01 DIAGNOSIS — G8220 Paraplegia, unspecified: Secondary | ICD-10-CM | POA: Diagnosis not present

## 2019-07-01 DIAGNOSIS — G8929 Other chronic pain: Secondary | ICD-10-CM

## 2019-07-01 DIAGNOSIS — M5442 Lumbago with sciatica, left side: Secondary | ICD-10-CM | POA: Diagnosis not present

## 2019-07-01 DIAGNOSIS — Z79899 Other long term (current) drug therapy: Secondary | ICD-10-CM | POA: Insufficient documentation

## 2019-07-01 DIAGNOSIS — M5441 Lumbago with sciatica, right side: Secondary | ICD-10-CM | POA: Diagnosis not present

## 2019-07-01 DIAGNOSIS — M79604 Pain in right leg: Secondary | ICD-10-CM

## 2019-07-01 DIAGNOSIS — Z91040 Latex allergy status: Secondary | ICD-10-CM | POA: Diagnosis not present

## 2019-07-01 DIAGNOSIS — S31000A Unspecified open wound of lower back and pelvis without penetration into retroperitoneum, initial encounter: Secondary | ICD-10-CM | POA: Diagnosis not present

## 2019-07-01 DIAGNOSIS — M545 Low back pain: Secondary | ICD-10-CM | POA: Diagnosis not present

## 2019-07-01 DIAGNOSIS — N3000 Acute cystitis without hematuria: Secondary | ICD-10-CM | POA: Diagnosis not present

## 2019-07-01 LAB — URINALYSIS, ROUTINE W REFLEX MICROSCOPIC
Bacteria, UA: NONE SEEN
Bilirubin Urine: NEGATIVE
Glucose, UA: NEGATIVE mg/dL
Ketones, ur: NEGATIVE mg/dL
Nitrite: POSITIVE — AB
Protein, ur: 100 mg/dL — AB
Specific Gravity, Urine: 1.023 (ref 1.005–1.030)
WBC, UA: >50 WBC/hpf — ABNORMAL HIGH (ref 0–5)
pH: 6 (ref 5.0–8.0)

## 2019-07-01 LAB — CBC
HCT: 39.1 % (ref 36.0–46.0)
Hemoglobin: 12.9 g/dL (ref 12.0–15.0)
MCH: 29.4 pg (ref 26.0–34.0)
MCHC: 33 g/dL (ref 30.0–36.0)
MCV: 89.1 fL (ref 80.0–100.0)
Platelets: 257 10*3/uL (ref 150–400)
RBC: 4.39 MIL/uL (ref 3.87–5.11)
RDW: 12.9 % (ref 11.5–15.5)
WBC: 13.5 10*3/uL — ABNORMAL HIGH (ref 4.0–10.5)
nRBC: 0 % (ref 0.0–0.2)

## 2019-07-01 LAB — BASIC METABOLIC PANEL
Anion gap: 13 (ref 5–15)
BUN: 7 mg/dL (ref 6–20)
CO2: 23 mmol/L (ref 22–32)
Calcium: 8.8 mg/dL — ABNORMAL LOW (ref 8.9–10.3)
Chloride: 100 mmol/L (ref 98–111)
Creatinine, Ser: 0.69 mg/dL (ref 0.44–1.00)
GFR calc Af Amer: >60 mL/min (ref >60)
GFR calc non Af Amer: >60 mL/min (ref >60)
Glucose, Bld: 132 mg/dL — ABNORMAL HIGH (ref 70–99)
Potassium: 3.4 mmol/L — ABNORMAL LOW (ref 3.5–5.1)
Sodium: 136 mmol/L (ref 135–145)

## 2019-07-01 LAB — I-STAT BETA HCG BLOOD, ED (MC, WL, AP ONLY): I-stat hCG, quantitative: <5 m[IU]/mL (ref <5)

## 2019-07-01 MED ORDER — GADOBUTROL 1 MMOL/ML IV SOLN
10.0000 mL | Freq: Once | INTRAVENOUS | Status: AC | PRN
  Administered 2019-07-01: 08:00:00 10 mL via INTRAVENOUS

## 2019-07-01 MED ORDER — HYDROCODONE-ACETAMINOPHEN 5-325 MG PO TABS: 1.0000 | ORAL_TABLET | Freq: Four times a day (QID) | ORAL | 0 refills | Status: DC | PRN

## 2019-07-01 MED ORDER — ONDANSETRON HCL 4 MG/2ML IJ SOLN
4.0000 mg | Freq: Once | INTRAMUSCULAR | Status: AC
  Administered 2019-07-01: 4 mg via INTRAVENOUS
  Filled 2019-07-01: qty 2

## 2019-07-01 MED ORDER — HYDROMORPHONE HCL 1 MG/ML IJ SOLN: 0.5000 mg | Freq: Once | INTRAMUSCULAR | Status: DC

## 2019-07-01 MED ORDER — SODIUM CHLORIDE 0.9 % IV SOLN
1.0000 g | Freq: Once | INTRAVENOUS | Status: AC
  Administered 2019-07-01: 1 g via INTRAVENOUS
  Filled 2019-07-01: qty 10

## 2019-07-01 MED ORDER — CEPHALEXIN 500 MG PO CAPS: 500.0000 mg | ORAL_CAPSULE | Freq: Four times a day (QID) | ORAL | 0 refills | Status: DC

## 2019-07-01 MED ORDER — HYDROMORPHONE HCL 1 MG/ML IJ SOLN
1.0000 mg | Freq: Once | INTRAMUSCULAR | Status: AC
  Administered 2019-07-01: 1 mg via INTRAVENOUS
  Filled 2019-07-01: qty 1

## 2019-07-01 MED ORDER — MORPHINE SULFATE (PF) 2 MG/ML IV SOLN
1.0000 mg | Freq: Once | INTRAVENOUS | Status: AC
  Administered 2019-07-01: 12:00:00 1 mg via INTRAVENOUS
  Filled 2019-07-01: qty 1

## 2019-07-01 NOTE — ED Triage Notes (Signed)
Pt reports lower back pain with bilateral leg pain. Has had some urinary incontinence. Began yesterday morning with no injury.

## 2019-07-01 NOTE — Discharge Instructions (Addendum)
Take the pain medicine as directed.  Continue your other medications.  Also start the Keflex for the urinary tract infection.  Return for any new or worse symptoms.  Make an appointment to follow-up with your doctor.  As we discussed MRI today showed no evidence of any infection in the spinal cord area.  Pain may very well be secondary to the urinary tract infection.  Urine culture sent results pending should result in about 2 days.
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
## 2019-07-03 LAB — URINE CULTURE: Culture: >=100000 — AB

## 2019-07-07 DIAGNOSIS — N390 Urinary tract infection, site not specified: Secondary | ICD-10-CM | POA: Diagnosis not present

## 2019-08-20 DIAGNOSIS — S14109S Unspecified injury at unspecified level of cervical spinal cord, sequela: Secondary | ICD-10-CM | POA: Diagnosis not present
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
## 2019-08-24 ENCOUNTER — Encounter: Payer: Self-pay | Admitting: Obstetrics and Gynecology

## 2019-08-24 ENCOUNTER — Ambulatory Visit (INDEPENDENT_AMBULATORY_CARE_PROVIDER_SITE_OTHER): Payer: Medicaid Other | Admitting: Obstetrics and Gynecology

## 2019-08-24 ENCOUNTER — Other Ambulatory Visit: Payer: Self-pay

## 2019-08-24 ENCOUNTER — Other Ambulatory Visit (HOSPITAL_COMMUNITY)
Admission: RE | Admit: 2019-08-24 | Discharge: 2019-08-24 | Disposition: A | Discharge status: Home / Self Care | Payer: Medicaid Other | Source: Ambulatory Visit | Attending: Obstetrics and Gynecology | Admitting: Obstetrics and Gynecology

## 2019-08-24 VITALS — BP 100/70 | Ht 66.5 in | Wt 170.0 lb

## 2019-08-24 DIAGNOSIS — Z3041 Encounter for surveillance of contraceptive pills: Secondary | ICD-10-CM

## 2019-08-24 DIAGNOSIS — Z8041 Family history of malignant neoplasm of ovary: Secondary | ICD-10-CM

## 2019-08-24 DIAGNOSIS — Z113 Encounter for screening for infections with a predominantly sexual mode of transmission: Secondary | ICD-10-CM | POA: Insufficient documentation

## 2019-08-24 DIAGNOSIS — Z0000 Encounter for general adult medical examination without abnormal findings: Secondary | ICD-10-CM | POA: Diagnosis not present

## 2019-08-24 DIAGNOSIS — Z01419 Encounter for gynecological examination (general) (routine) without abnormal findings: Secondary | ICD-10-CM | POA: Diagnosis not present

## 2019-08-24 MED ORDER — NORETHIN ACE-ETH ESTRAD-FE 1-20 MG-MCG PO TABS: 1.0000 | ORAL_TABLET | Freq: Every day | ORAL | 3 refills | Status: DC

## 2019-08-24 NOTE — Patient Instructions (Signed)
I value your feedback and entrusting us with your care. If you get a [facility] patient survey, I would appreciate you taking the time to let us know about your experience today. Thank you!  As of June 10, 2019, your lab results will be released to your MyChart immediately, before I even have a chance to see them. Please give me time to review them and contact you if there are any abnormalities. Thank you for your patience.  

## 2019-08-24 NOTE — Addendum Note (Signed)
Addended by: Althea Grimmer B on: 08/24/2019 01:57 PM   Modules accepted: Orders

## 2019-08-26 LAB — CERVICOVAGINAL ANCILLARY ONLY
Chlamydia: NEGATIVE
Comment: NEGATIVE
Comment: NORMAL
Neisseria Gonorrhea: NEGATIVE

## 2019-09-30 DIAGNOSIS — G8220 Paraplegia, unspecified: Secondary | ICD-10-CM | POA: Diagnosis not present

## 2019-09-30 DIAGNOSIS — M79605 Pain in left leg: Secondary | ICD-10-CM | POA: Diagnosis not present

## 2019-09-30 DIAGNOSIS — S34103S Unspecified injury to L3 level of lumbar spinal cord, sequela: Secondary | ICD-10-CM | POA: Diagnosis not present

## 2019-10-10 ENCOUNTER — Emergency Department (HOSPITAL_COMMUNITY): Payer: Medicaid Other

## 2019-10-10 ENCOUNTER — Encounter (HOSPITAL_COMMUNITY): Payer: Self-pay | Admitting: *Deleted

## 2019-10-10 ENCOUNTER — Emergency Department (HOSPITAL_COMMUNITY)
Admission: EM | Admit: 2019-10-10 | Discharge: 2019-10-10 | Disposition: A | Discharge status: Home / Self Care | Payer: Medicaid Other | Attending: Emergency Medicine | Admitting: Emergency Medicine

## 2019-10-10 ENCOUNTER — Other Ambulatory Visit: Payer: Self-pay

## 2019-10-10 DIAGNOSIS — G8220 Paraplegia, unspecified: Secondary | ICD-10-CM | POA: Insufficient documentation

## 2019-10-10 DIAGNOSIS — Z79899 Other long term (current) drug therapy: Secondary | ICD-10-CM | POA: Diagnosis not present

## 2019-10-10 DIAGNOSIS — M79675 Pain in left toe(s): Secondary | ICD-10-CM | POA: Diagnosis present

## 2019-10-10 DIAGNOSIS — L089 Local infection of the skin and subcutaneous tissue, unspecified: Secondary | ICD-10-CM | POA: Diagnosis not present

## 2019-10-10 DIAGNOSIS — Z91040 Latex allergy status: Secondary | ICD-10-CM | POA: Diagnosis not present

## 2019-10-10 DIAGNOSIS — L600 Ingrowing nail: Secondary | ICD-10-CM | POA: Diagnosis not present

## 2019-10-10 LAB — POC URINE PREG, ED: Preg Test, Ur: NEGATIVE

## 2019-10-10 MED ORDER — CEPHALEXIN 500 MG PO CAPS: 500.0000 mg | ORAL_CAPSULE | Freq: Four times a day (QID) | ORAL | 0 refills | Status: AC

## 2019-10-10 MED ORDER — ACETAMINOPHEN 325 MG PO TABS
650.0000 mg | ORAL_TABLET | Freq: Once | ORAL | Status: AC
  Administered 2019-10-10: 650 mg via ORAL
  Filled 2019-10-10: qty 2

## 2019-10-10 NOTE — ED Triage Notes (Signed)
Pt reports ingrown toenail left big toe. Started hurting 4 days ago. This happened with a toenail on her right foot last year and she eventually had to have that toe amputated so she wanted to see if she needs antibiotics now.
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
## 2019-10-10 NOTE — Discharge Instructions (Addendum)
The x-ray of your toe did not show any evidence of infection in the bone.  Your pregnancy test was negative.  You were given a prescription for antibiotics. Please take the antibiotic prescription fully.   You may alternate taking Tylenol and Ibuprofen as needed for pain control. You may take 400-600 mg of ibuprofen every 6 hours and [phone] mg of Tylenol every 6 hours. Do not exceed 4000 mg of Tylenol daily as this can lead to liver damage. Also, make sure to take Ibuprofen with meals as it can cause an upset stomach. Do not take other NSAIDs while taking Ibuprofen such as (Aleve, Naprosyn, Aspirin, Celebrex, etc) and do not take more than the prescribed dose as this can lead to ulcers and bleeding in your GI tract. You may use warm and cold compresses to help with your symptoms.   Please follow up with your primary doctor within the next 2 days for re-evaluation and further treatment of your symptoms.  You were also given information to follow-up with Dr. Lajoyce Corners in regards to your toe infection.  Please call the office schedule an appointment for follow-up.  Please return to the emergency room immediately if you experience any new or worsening symptoms or any symptoms that indicate worsening infection such as fevers, increased redness/swelling/pain, warmth, or drainage from the affected area.

## 2019-10-12 DIAGNOSIS — L03032 Cellulitis of left toe: Secondary | ICD-10-CM | POA: Diagnosis not present

## 2019-10-12 DIAGNOSIS — G8220 Paraplegia, unspecified: Secondary | ICD-10-CM | POA: Diagnosis not present

## 2019-10-12 DIAGNOSIS — M792 Neuralgia and neuritis, unspecified: Secondary | ICD-10-CM | POA: Diagnosis not present

## 2019-10-14 DIAGNOSIS — G629 Polyneuropathy, unspecified: Secondary | ICD-10-CM | POA: Diagnosis not present

## 2019-10-14 DIAGNOSIS — M79672 Pain in left foot: Secondary | ICD-10-CM | POA: Diagnosis not present

## 2019-10-14 DIAGNOSIS — L03032 Cellulitis of left toe: Secondary | ICD-10-CM | POA: Diagnosis not present

## 2019-10-14 DIAGNOSIS — L600 Ingrowing nail: Secondary | ICD-10-CM | POA: Diagnosis not present

## 2019-10-15 DIAGNOSIS — S14109S Unspecified injury at unspecified level of cervical spinal cord, sequela: Secondary | ICD-10-CM | POA: Diagnosis not present

## 2019-10-28 DIAGNOSIS — M79672 Pain in left foot: Secondary | ICD-10-CM | POA: Diagnosis not present

## 2019-10-28 DIAGNOSIS — L600 Ingrowing nail: Secondary | ICD-10-CM | POA: Diagnosis not present

## 2019-10-28 DIAGNOSIS — L03032 Cellulitis of left toe: Secondary | ICD-10-CM | POA: Diagnosis not present

## 2019-10-28 DIAGNOSIS — G629 Polyneuropathy, unspecified: Secondary | ICD-10-CM | POA: Diagnosis not present

## 2019-11-16 DIAGNOSIS — L988 Other specified disorders of the skin and subcutaneous tissue: Secondary | ICD-10-CM | POA: Diagnosis not present

## 2019-11-16 DIAGNOSIS — L600 Ingrowing nail: Secondary | ICD-10-CM | POA: Diagnosis not present

## 2019-11-16 DIAGNOSIS — M79672 Pain in left foot: Secondary | ICD-10-CM | POA: Diagnosis not present

## 2019-11-16 DIAGNOSIS — G629 Polyneuropathy, unspecified: Secondary | ICD-10-CM | POA: Diagnosis not present

## 2019-12-03 DIAGNOSIS — S14109S Unspecified injury at unspecified level of cervical spinal cord, sequela: Secondary | ICD-10-CM | POA: Diagnosis not present

## 2019-12-30 DIAGNOSIS — Z89421 Acquired absence of other right toe(s): Secondary | ICD-10-CM | POA: Diagnosis not present

## 2019-12-30 DIAGNOSIS — Z91040 Latex allergy status: Secondary | ICD-10-CM | POA: Diagnosis not present

## 2019-12-30 DIAGNOSIS — G629 Polyneuropathy, unspecified: Secondary | ICD-10-CM | POA: Diagnosis not present

## 2019-12-30 DIAGNOSIS — M5136 Other intervertebral disc degeneration, lumbar region: Secondary | ICD-10-CM | POA: Diagnosis not present

## 2019-12-30 DIAGNOSIS — N393 Stress incontinence (female) (male): Secondary | ICD-10-CM | POA: Diagnosis not present

## 2019-12-30 DIAGNOSIS — M5134 Other intervertebral disc degeneration, thoracic region: Secondary | ICD-10-CM | POA: Diagnosis not present

## 2019-12-30 DIAGNOSIS — R252 Cramp and spasm: Secondary | ICD-10-CM | POA: Diagnosis not present

## 2019-12-30 DIAGNOSIS — Z419 Encounter for procedure for purposes other than remedying health state, unspecified: Secondary | ICD-10-CM | POA: Diagnosis not present

## 2019-12-30 DIAGNOSIS — S34109D Unspecified injury to unspecified level of lumbar spinal cord, subsequent encounter: Secondary | ICD-10-CM | POA: Diagnosis not present

## 2020-01-25 ENCOUNTER — Ambulatory Visit (HOSPITAL_COMMUNITY): Payer: Medicaid Other | Attending: Physical Medicine and Rehabilitation | Admitting: Physical Therapy

## 2020-01-25 ENCOUNTER — Other Ambulatory Visit: Payer: Self-pay

## 2020-01-25 DIAGNOSIS — N3946 Mixed incontinence: Secondary | ICD-10-CM | POA: Insufficient documentation
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
## 2020-01-30 DIAGNOSIS — Z419 Encounter for procedure for purposes other than remedying health state, unspecified: Secondary | ICD-10-CM | POA: Diagnosis not present

## 2020-02-01 ENCOUNTER — Ambulatory Visit (HOSPITAL_COMMUNITY): Payer: Medicaid Other | Admitting: Physical Therapy

## 2020-02-03 ENCOUNTER — Ambulatory Visit (HOSPITAL_COMMUNITY): Payer: Medicaid Other | Attending: Physical Medicine and Rehabilitation | Admitting: Physical Therapy

## 2020-02-03 ENCOUNTER — Other Ambulatory Visit: Payer: Self-pay

## 2020-02-03 DIAGNOSIS — N3946 Mixed incontinence: Secondary | ICD-10-CM | POA: Diagnosis not present
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
## 2020-02-04 ENCOUNTER — Emergency Department (HOSPITAL_COMMUNITY)
Admission: EM | Admit: 2020-02-04 | Discharge: 2020-02-04 | Disposition: A | Discharge status: Home / Self Care | Payer: Medicaid Other | Attending: Emergency Medicine | Admitting: Emergency Medicine

## 2020-02-04 ENCOUNTER — Encounter (HOSPITAL_COMMUNITY): Payer: Self-pay | Admitting: Emergency Medicine

## 2020-02-04 ENCOUNTER — Emergency Department
Admission: EM | Admit: 2020-02-04 | Discharge: 2020-02-05 | Disposition: A | Discharge status: Home / Self Care | Payer: Medicaid Other | Attending: Emergency Medicine | Admitting: Emergency Medicine

## 2020-02-04 ENCOUNTER — Other Ambulatory Visit: Payer: Self-pay

## 2020-02-04 DIAGNOSIS — Z5321 Procedure and treatment not carried out due to patient leaving prior to being seen by health care provider: Secondary | ICD-10-CM | POA: Diagnosis not present

## 2020-02-04 DIAGNOSIS — M79606 Pain in leg, unspecified: Secondary | ICD-10-CM | POA: Insufficient documentation

## 2020-02-04 DIAGNOSIS — M792 Neuralgia and neuritis, unspecified: Secondary | ICD-10-CM | POA: Insufficient documentation

## 2020-02-04 DIAGNOSIS — Z91040 Latex allergy status: Secondary | ICD-10-CM | POA: Insufficient documentation

## 2020-02-04 DIAGNOSIS — M545 Low back pain: Secondary | ICD-10-CM | POA: Insufficient documentation

## 2020-02-04 DIAGNOSIS — R509 Fever, unspecified: Secondary | ICD-10-CM | POA: Insufficient documentation

## 2020-02-04 DIAGNOSIS — N390 Urinary tract infection, site not specified: Secondary | ICD-10-CM

## 2020-02-04 LAB — CBC WITH DIFFERENTIAL/PLATELET
Abs Immature Granulocytes: 0.06 10*3/uL (ref 0.00–0.07)
Basophils Absolute: 0.1 10*3/uL (ref 0.0–0.1)
Basophils Relative: 0 %
Eosinophils Absolute: 0.1 10*3/uL (ref 0.0–0.5)
Eosinophils Relative: 0 %
HCT: 38.8 % (ref 36.0–46.0)
Hemoglobin: 13.4 g/dL (ref 12.0–15.0)
Immature Granulocytes: 0 %
Lymphocytes Relative: 11 %
Lymphs Abs: 1.5 10*3/uL (ref 0.7–4.0)
MCH: 29.5 pg (ref 26.0–34.0)
MCHC: 34.5 g/dL (ref 30.0–36.0)
MCV: 85.3 fL (ref 80.0–100.0)
Monocytes Absolute: 1 10*3/uL (ref 0.1–1.0)
Monocytes Relative: 8 %
Neutro Abs: 10.9 10*3/uL — ABNORMAL HIGH (ref 1.7–7.7)
Neutrophils Relative %: 81 %
Platelets: 281 10*3/uL (ref 150–400)
RBC: 4.55 MIL/uL (ref 3.87–5.11)
RDW: 12.5 % (ref 11.5–15.5)
WBC: 13.6 10*3/uL — ABNORMAL HIGH (ref 4.0–10.5)
nRBC: 0 % (ref 0.0–0.2)

## 2020-02-04 LAB — COMPREHENSIVE METABOLIC PANEL
ALT: 20 U/L (ref 0–44)
AST: 24 U/L (ref 15–41)
Albumin: 3.9 g/dL (ref 3.5–5.0)
Alkaline Phosphatase: 78 U/L (ref 38–126)
Anion gap: 10 (ref 5–15)
BUN: 12 mg/dL (ref 6–20)
CO2: 21 mmol/L — ABNORMAL LOW (ref 22–32)
Calcium: 8.7 mg/dL — ABNORMAL LOW (ref 8.9–10.3)
Chloride: 105 mmol/L (ref 98–111)
Creatinine, Ser: 0.74 mg/dL (ref 0.44–1.00)
GFR calc Af Amer: >60 mL/min (ref >60)
GFR calc non Af Amer: >60 mL/min (ref >60)
Glucose, Bld: 136 mg/dL — ABNORMAL HIGH (ref 70–99)
Potassium: 4.2 mmol/L (ref 3.5–5.1)
Sodium: 136 mmol/L (ref 135–145)
Total Bilirubin: 0.7 mg/dL (ref 0.3–1.2)
Total Protein: 7.4 g/dL (ref 6.5–8.1)

## 2020-02-04 LAB — URINALYSIS, COMPLETE (UACMP) WITH MICROSCOPIC
Bilirubin Urine: NEGATIVE
Glucose, UA: NEGATIVE mg/dL
Ketones, ur: NEGATIVE mg/dL
Nitrite: POSITIVE — AB
Protein, ur: 30 mg/dL — AB
Specific Gravity, Urine: 1.018 (ref 1.005–1.030)
WBC, UA: >50 WBC/hpf — ABNORMAL HIGH (ref 0–5)
pH: 7 (ref 5.0–8.0)

## 2020-02-04 MED ORDER — MORPHINE SULFATE (PF) 4 MG/ML IV SOLN
4.0000 mg | Freq: Once | INTRAVENOUS | Status: AC
  Administered 2020-02-05: 4 mg via INTRAVENOUS
  Filled 2020-02-04: qty 1

## 2020-02-04 MED ORDER — ONDANSETRON HCL 4 MG/2ML IJ SOLN
4.0000 mg | Freq: Once | INTRAMUSCULAR | Status: AC
  Administered 2020-02-05: 4 mg via INTRAVENOUS
  Filled 2020-02-04: qty 2

## 2020-02-04 MED ORDER — SODIUM CHLORIDE 0.9 % IV SOLN
1.0000 g | Freq: Once | INTRAVENOUS | Status: AC
  Administered 2020-02-05: 1 g via INTRAVENOUS
  Filled 2020-02-04: qty 10

## 2020-02-04 MED ORDER — SODIUM CHLORIDE 0.9 % IV BOLUS
1000.0000 mL | Freq: Once | INTRAVENOUS | Status: AC
  Administered 2020-02-05: 1000 mL via INTRAVENOUS

## 2020-02-04 MED ORDER — ACETAMINOPHEN 325 MG PO TABS
650.0000 mg | ORAL_TABLET | Freq: Once | ORAL | Status: DC | PRN
  Filled 2020-02-04: qty 2
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
## 2020-02-04 NOTE — ED Triage Notes (Signed)
Pt has chronic low back and leg pain d/t she has nerve damage. Pt reports it has gotten worse over the last 2 days. Has tried aleve with no relief

## 2020-02-05 ENCOUNTER — Emergency Department: Payer: Medicaid Other

## 2020-02-05 DIAGNOSIS — R509 Fever, unspecified: Secondary | ICD-10-CM | POA: Diagnosis not present

## 2020-02-05 LAB — LACTIC ACID, PLASMA: Lactic Acid, Venous: 0.9 mmol/L (ref 0.5–1.9)

## 2020-02-05 LAB — PREGNANCY, URINE: Preg Test, Ur: NEGATIVE

## 2020-02-05 MED ORDER — OXYCODONE-ACETAMINOPHEN 5-325 MG PO TABS: 1.0000 | ORAL_TABLET | ORAL | 0 refills | Status: DC | PRN

## 2020-02-05 MED ORDER — OXYCODONE-ACETAMINOPHEN 5-325 MG PO TABS
1.0000 | ORAL_TABLET | Freq: Once | ORAL | Status: AC
  Administered 2020-02-05: 1 via ORAL
  Filled 2020-02-05: qty 1

## 2020-02-05 MED ORDER — KETOROLAC TROMETHAMINE 30 MG/ML IJ SOLN
10.0000 mg | Freq: Once | INTRAMUSCULAR | Status: AC
  Administered 2020-02-05: 9.9 mg via INTRAVENOUS
  Filled 2020-02-05: qty 1

## 2020-02-05 MED ORDER — CEPHALEXIN 500 MG PO CAPS
500.0000 mg | ORAL_CAPSULE | Freq: Three times a day (TID) | ORAL | 0 refills | Status: DC
Start: 2020-02-05 — End: 2020-08-28

## 2020-02-05 NOTE — ED Notes (Signed)
Pt states no improvement in pain. Requesting additional pain medication. md notified.

## 2020-02-05 NOTE — ED Notes (Signed)
Po fluids provided.  

## 2020-02-05 NOTE — ED Notes (Signed)
Pt requesting more pain medication, md notified.

## 2020-02-05 NOTE — Discharge Instructions (Addendum)
1.  Take antibiotic as prescribed (Keflex 500 mg 3 times daily x7 days). 2.  You may take ibuprofen as needed for pain, Percocet as needed for more severe pain. 3.  Return to the ER for worsening symptoms, persistent vomiting, difficulty breathing or other concerns.

## 2020-02-07 LAB — URINE CULTURE: Culture: >=100000 — AB

## 2020-02-08 ENCOUNTER — Ambulatory Visit (HOSPITAL_COMMUNITY): Payer: Medicaid Other | Admitting: Physical Therapy

## 2020-02-08 ENCOUNTER — Telehealth (HOSPITAL_COMMUNITY): Payer: Self-pay | Admitting: Physical Therapy

## 2020-02-08 DIAGNOSIS — N390 Urinary tract infection, site not specified: Secondary | ICD-10-CM | POA: Diagnosis not present

## 2020-02-08 DIAGNOSIS — R300 Dysuria: Secondary | ICD-10-CM | POA: Diagnosis not present

## 2020-02-08 DIAGNOSIS — M549 Dorsalgia, unspecified: Secondary | ICD-10-CM | POA: Diagnosis not present

## 2020-02-08 LAB — POCT PREGNANCY, URINE: Preg Test, Ur: NEGATIVE

## 2020-02-08 NOTE — Telephone Encounter (Signed)
She is sick today and did not sleep

## 2020-02-10 LAB — CULTURE, BLOOD (ROUTINE X 2)
Culture: NO GROWTH
Culture: NO GROWTH
Special Requests: ADEQUATE
Special Requests: ADEQUATE

## 2020-02-15 ENCOUNTER — Encounter (HOSPITAL_COMMUNITY): Payer: Medicaid Other | Admitting: Physical Therapy

## 2020-02-17 ENCOUNTER — Encounter (HOSPITAL_COMMUNITY): Payer: Self-pay | Admitting: Physical Therapy

## 2020-02-17 ENCOUNTER — Other Ambulatory Visit: Payer: Self-pay

## 2020-02-17 ENCOUNTER — Ambulatory Visit (HOSPITAL_COMMUNITY): Payer: Medicaid Other | Admitting: Physical Therapy

## 2020-02-17 DIAGNOSIS — N3946 Mixed incontinence: Secondary | ICD-10-CM
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
## 2020-02-22 ENCOUNTER — Encounter (HOSPITAL_COMMUNITY): Payer: Medicaid Other | Admitting: Physical Therapy
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
## 2020-02-28 ENCOUNTER — Other Ambulatory Visit: Payer: Self-pay

## 2020-02-28 ENCOUNTER — Ambulatory Visit (HOSPITAL_COMMUNITY): Payer: Medicaid Other | Admitting: Physical Therapy

## 2020-02-28 DIAGNOSIS — S14109S Unspecified injury at unspecified level of cervical spinal cord, sequela: Secondary | ICD-10-CM | POA: Diagnosis not present

## 2020-02-28 DIAGNOSIS — N3946 Mixed incontinence: Secondary | ICD-10-CM | POA: Diagnosis not present

## 2020-02-28 DIAGNOSIS — S34103S Unspecified injury to L3 level of lumbar spinal cord, sequela: Secondary | ICD-10-CM | POA: Diagnosis not present
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
## 2020-02-29 ENCOUNTER — Telehealth (HOSPITAL_COMMUNITY): Payer: Self-pay | Admitting: Psychiatry

## 2020-03-01 ENCOUNTER — Ambulatory Visit (HOSPITAL_COMMUNITY): Payer: Medicaid Other | Attending: Pediatrics | Admitting: Physical Therapy

## 2020-03-01 ENCOUNTER — Encounter (HOSPITAL_COMMUNITY): Payer: Self-pay | Admitting: Physical Therapy

## 2020-03-01 ENCOUNTER — Other Ambulatory Visit: Payer: Self-pay

## 2020-03-01 DIAGNOSIS — M545 Low back pain, unspecified: Secondary | ICD-10-CM

## 2020-03-01 DIAGNOSIS — G8929 Other chronic pain: Secondary | ICD-10-CM | POA: Diagnosis not present

## 2020-03-01 DIAGNOSIS — R262 Difficulty in walking, not elsewhere classified: Secondary | ICD-10-CM | POA: Insufficient documentation

## 2020-03-01 DIAGNOSIS — M6281 Muscle weakness (generalized): Secondary | ICD-10-CM | POA: Diagnosis not present

## 2020-03-01 NOTE — Patient Instructions (Signed)
Date Morning walk  Evening walk Extra walk  9/1 2:38 then pain    9/2     9/3     9/4     9/5     9/6     9/7     9/8     9/9                                                                                                                     walking program at least 2x a day- write the amount of time walked in one go/bout and record if you had pain
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
## 2020-03-07 ENCOUNTER — Encounter (HOSPITAL_COMMUNITY): Payer: Medicaid Other | Admitting: Physical Therapy

## 2020-03-07 ENCOUNTER — Telehealth (HOSPITAL_COMMUNITY): Payer: Self-pay | Admitting: Physical Therapy

## 2020-03-07 NOTE — Telephone Encounter (Signed)
pt rescheduled appt for today because she has to take her brother to the MD

## 2020-03-08 ENCOUNTER — Ambulatory Visit (HOSPITAL_COMMUNITY): Payer: Medicaid Other

## 2020-03-09 ENCOUNTER — Other Ambulatory Visit: Payer: Self-pay

## 2020-03-09 ENCOUNTER — Encounter (HOSPITAL_COMMUNITY): Payer: Self-pay

## 2020-03-09 ENCOUNTER — Ambulatory Visit (HOSPITAL_COMMUNITY): Payer: Medicaid Other

## 2020-03-09 DIAGNOSIS — R262 Difficulty in walking, not elsewhere classified: Secondary | ICD-10-CM

## 2020-03-09 DIAGNOSIS — M545 Low back pain, unspecified: Secondary | ICD-10-CM

## 2020-03-09 DIAGNOSIS — M6281 Muscle weakness (generalized): Secondary | ICD-10-CM | POA: Diagnosis not present

## 2020-03-09 DIAGNOSIS — G8929 Other chronic pain: Secondary | ICD-10-CM | POA: Diagnosis not present

## 2020-03-09 NOTE — Patient Instructions (Signed)
Bridge    Lie back, legs bent. Inhale, pressing hips up. Keeping ribs in, lengthen lower back. Exhale, rolling down along spine from top. Repeat 10 times. Do 2 sessions per day.  http://pm.exer.us/55   Copyright  VHI. All rights reserved.   
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
## 2020-03-17 ENCOUNTER — Encounter (HOSPITAL_COMMUNITY): Payer: Medicaid Other | Admitting: Physical Therapy

## 2020-03-17 ENCOUNTER — Telehealth (HOSPITAL_COMMUNITY): Payer: Self-pay | Admitting: Physical Therapy

## 2020-03-17 NOTE — Telephone Encounter (Signed)
Called regarding patient not showing up for appointment this afternoon. Patient reported that she forgot about today's appointment and was working on school. Therapist reminded of next scheduled appointment and patient reported that she would be there.  Verne Carrow PT, DPT 5:20 PM, 03/17/20 [phone]

## 2020-03-18 ENCOUNTER — Other Ambulatory Visit: Payer: Medicaid Other

## 2020-03-18 ENCOUNTER — Other Ambulatory Visit: Payer: Self-pay

## 2020-03-18 DIAGNOSIS — Z20822 Contact with and (suspected) exposure to covid-19: Secondary | ICD-10-CM

## 2020-03-19 DIAGNOSIS — Z20822 Contact with and (suspected) exposure to covid-19: Secondary | ICD-10-CM | POA: Diagnosis not present

## 2020-03-21 ENCOUNTER — Other Ambulatory Visit: Payer: Self-pay

## 2020-03-21 ENCOUNTER — Encounter (HOSPITAL_COMMUNITY): Payer: Self-pay | Admitting: Physical Therapy

## 2020-03-21 ENCOUNTER — Ambulatory Visit (HOSPITAL_COMMUNITY): Payer: Medicaid Other | Admitting: Physical Therapy

## 2020-03-21 DIAGNOSIS — G8929 Other chronic pain: Secondary | ICD-10-CM

## 2020-03-21 DIAGNOSIS — R262 Difficulty in walking, not elsewhere classified: Secondary | ICD-10-CM | POA: Diagnosis not present

## 2020-03-21 DIAGNOSIS — M6281 Muscle weakness (generalized): Secondary | ICD-10-CM

## 2020-03-21 DIAGNOSIS — M545 Low back pain, unspecified: Secondary | ICD-10-CM

## 2020-03-21 LAB — SARS-COV-2, NAA 2 DAY TAT

## 2020-03-21 LAB — NOVEL CORONAVIRUS, NAA: SARS-CoV-2, NAA: NOT DETECTED

## 2020-03-21 LAB — SPECIMEN STATUS REPORT
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
## 2020-03-24 ENCOUNTER — Telehealth (HOSPITAL_COMMUNITY): Payer: Self-pay | Admitting: Physical Therapy

## 2020-03-24 ENCOUNTER — Ambulatory Visit (HOSPITAL_COMMUNITY): Payer: Medicaid Other | Admitting: Physical Therapy

## 2020-03-24 NOTE — Telephone Encounter (Signed)
pt does not have a ride

## 2020-03-27 ENCOUNTER — Other Ambulatory Visit: Payer: Self-pay

## 2020-03-27 ENCOUNTER — Other Ambulatory Visit: Payer: Medicaid Other

## 2020-03-27 DIAGNOSIS — Z20822 Contact with and (suspected) exposure to covid-19: Secondary | ICD-10-CM | POA: Diagnosis not present

## 2020-03-28 ENCOUNTER — Ambulatory Visit (HOSPITAL_COMMUNITY): Payer: Medicaid Other | Admitting: Physical Therapy

## 2020-03-28 LAB — NOVEL CORONAVIRUS, NAA: SARS-CoV-2, NAA: NOT DETECTED

## 2020-03-28 LAB — SARS-COV-2, NAA 2 DAY TAT

## 2020-03-28 LAB — SPECIMEN STATUS REPORT

## 2020-03-31 ENCOUNTER — Encounter (HOSPITAL_COMMUNITY): Payer: Self-pay | Admitting: Physical Therapy

## 2020-03-31 ENCOUNTER — Other Ambulatory Visit: Payer: Self-pay

## 2020-03-31 ENCOUNTER — Ambulatory Visit (HOSPITAL_COMMUNITY): Payer: Medicaid Other | Attending: Pediatrics | Admitting: Physical Therapy

## 2020-03-31 DIAGNOSIS — R262 Difficulty in walking, not elsewhere classified: Secondary | ICD-10-CM | POA: Diagnosis not present

## 2020-03-31 DIAGNOSIS — G8929 Other chronic pain: Secondary | ICD-10-CM | POA: Diagnosis not present

## 2020-03-31 DIAGNOSIS — M545 Low back pain, unspecified: Secondary | ICD-10-CM | POA: Diagnosis not present

## 2020-03-31 DIAGNOSIS — M6281 Muscle weakness (generalized): Secondary | ICD-10-CM

## 2020-03-31 DIAGNOSIS — Z419 Encounter for procedure for purposes other than remedying health state, unspecified: Secondary | ICD-10-CM | POA: Diagnosis not present
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
## 2020-04-03 DIAGNOSIS — Z6835 Body mass index (BMI) 35.0-35.9, adult: Secondary | ICD-10-CM | POA: Diagnosis not present

## 2020-04-03 DIAGNOSIS — S34109D Unspecified injury to unspecified level of lumbar spinal cord, subsequent encounter: Secondary | ICD-10-CM | POA: Diagnosis not present

## 2020-04-04 ENCOUNTER — Ambulatory Visit (HOSPITAL_COMMUNITY): Payer: Medicaid Other

## 2020-04-04 ENCOUNTER — Encounter (HOSPITAL_COMMUNITY): Payer: Self-pay

## 2020-04-04 ENCOUNTER — Other Ambulatory Visit: Payer: Self-pay

## 2020-04-04 DIAGNOSIS — G8929 Other chronic pain: Secondary | ICD-10-CM | POA: Diagnosis not present

## 2020-04-04 DIAGNOSIS — M6281 Muscle weakness (generalized): Secondary | ICD-10-CM

## 2020-04-04 DIAGNOSIS — M545 Low back pain, unspecified: Secondary | ICD-10-CM | POA: Diagnosis not present

## 2020-04-04 DIAGNOSIS — R262 Difficulty in walking, not elsewhere classified: Secondary | ICD-10-CM
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
## 2020-04-07 ENCOUNTER — Other Ambulatory Visit: Payer: Self-pay

## 2020-04-07 ENCOUNTER — Ambulatory Visit (HOSPITAL_COMMUNITY): Payer: Medicaid Other

## 2020-04-07 ENCOUNTER — Encounter (HOSPITAL_COMMUNITY): Payer: Self-pay

## 2020-04-07 DIAGNOSIS — R262 Difficulty in walking, not elsewhere classified: Secondary | ICD-10-CM | POA: Diagnosis not present

## 2020-04-07 DIAGNOSIS — M545 Low back pain, unspecified: Secondary | ICD-10-CM

## 2020-04-07 DIAGNOSIS — M6281 Muscle weakness (generalized): Secondary | ICD-10-CM

## 2020-04-07 DIAGNOSIS — G8929 Other chronic pain: Secondary | ICD-10-CM
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
## 2020-04-11 ENCOUNTER — Encounter (HOSPITAL_COMMUNITY): Payer: Self-pay

## 2020-04-11 ENCOUNTER — Ambulatory Visit (HOSPITAL_COMMUNITY): Payer: Medicaid Other

## 2020-04-11 ENCOUNTER — Other Ambulatory Visit: Payer: Self-pay

## 2020-04-11 DIAGNOSIS — M6281 Muscle weakness (generalized): Secondary | ICD-10-CM

## 2020-04-11 DIAGNOSIS — R262 Difficulty in walking, not elsewhere classified: Secondary | ICD-10-CM | POA: Diagnosis not present

## 2020-04-11 DIAGNOSIS — M545 Low back pain, unspecified: Secondary | ICD-10-CM

## 2020-04-11 DIAGNOSIS — G8929 Other chronic pain: Secondary | ICD-10-CM
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
## 2020-04-14 ENCOUNTER — Telehealth (HOSPITAL_COMMUNITY): Payer: Self-pay

## 2020-04-14 ENCOUNTER — Encounter (HOSPITAL_COMMUNITY): Payer: Medicaid Other

## 2020-04-14 ENCOUNTER — Ambulatory Visit (HOSPITAL_COMMUNITY): Payer: Medicaid Other

## 2020-04-14 NOTE — Telephone Encounter (Signed)
pt called to cx this appt due to she has a lot going on today.

## 2020-04-18 ENCOUNTER — Encounter (HOSPITAL_COMMUNITY): Payer: Medicaid Other | Admitting: Physical Therapy

## 2020-04-20 ENCOUNTER — Other Ambulatory Visit: Payer: Self-pay

## 2020-04-20 ENCOUNTER — Encounter (HOSPITAL_COMMUNITY): Payer: Self-pay | Admitting: Physical Therapy

## 2020-04-20 ENCOUNTER — Ambulatory Visit (HOSPITAL_COMMUNITY): Payer: Medicaid Other | Admitting: Physical Therapy

## 2020-04-20 DIAGNOSIS — G8929 Other chronic pain: Secondary | ICD-10-CM | POA: Diagnosis not present

## 2020-04-20 DIAGNOSIS — M545 Low back pain, unspecified: Secondary | ICD-10-CM

## 2020-04-20 DIAGNOSIS — R262 Difficulty in walking, not elsewhere classified: Secondary | ICD-10-CM

## 2020-04-20 DIAGNOSIS — M6281 Muscle weakness (generalized): Secondary | ICD-10-CM
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
## 2020-04-24 DIAGNOSIS — K8020 Calculus of gallbladder without cholecystitis without obstruction: Secondary | ICD-10-CM | POA: Diagnosis not present

## 2020-04-25 ENCOUNTER — Ambulatory Visit (HOSPITAL_COMMUNITY): Payer: Medicaid Other | Admitting: Physical Therapy

## 2020-04-25 ENCOUNTER — Encounter (HOSPITAL_COMMUNITY): Payer: Self-pay | Admitting: Physical Therapy

## 2020-04-25 ENCOUNTER — Other Ambulatory Visit: Payer: Self-pay

## 2020-04-25 DIAGNOSIS — R262 Difficulty in walking, not elsewhere classified: Secondary | ICD-10-CM | POA: Diagnosis not present

## 2020-04-25 DIAGNOSIS — M545 Low back pain, unspecified: Secondary | ICD-10-CM | POA: Diagnosis not present

## 2020-04-25 DIAGNOSIS — M6281 Muscle weakness (generalized): Secondary | ICD-10-CM

## 2020-04-25 DIAGNOSIS — G8929 Other chronic pain: Secondary | ICD-10-CM
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
## 2020-04-27 ENCOUNTER — Telehealth (HOSPITAL_COMMUNITY): Payer: Self-pay

## 2020-04-27 ENCOUNTER — Ambulatory Visit (HOSPITAL_COMMUNITY): Payer: Medicaid Other

## 2020-04-27 NOTE — Telephone Encounter (Signed)
pt called to cx today's appt due to she pulled a muscle in her neck

## 2020-05-01 DIAGNOSIS — Z419 Encounter for procedure for purposes other than remedying health state, unspecified: Secondary | ICD-10-CM | POA: Diagnosis not present

## 2020-05-03 ENCOUNTER — Ambulatory Visit (HOSPITAL_COMMUNITY): Payer: Medicaid Other | Attending: Pediatrics | Admitting: Physical Therapy

## 2020-05-03 ENCOUNTER — Other Ambulatory Visit: Payer: Self-pay

## 2020-05-03 ENCOUNTER — Encounter (HOSPITAL_COMMUNITY): Payer: Self-pay | Admitting: Physical Therapy

## 2020-05-03 DIAGNOSIS — M6281 Muscle weakness (generalized): Secondary | ICD-10-CM | POA: Insufficient documentation

## 2020-05-03 DIAGNOSIS — R2689 Other abnormalities of gait and mobility: Secondary | ICD-10-CM | POA: Insufficient documentation

## 2020-05-03 DIAGNOSIS — M545 Low back pain, unspecified: Secondary | ICD-10-CM | POA: Insufficient documentation

## 2020-05-03 DIAGNOSIS — G8929 Other chronic pain: Secondary | ICD-10-CM | POA: Diagnosis not present

## 2020-05-03 DIAGNOSIS — R262 Difficulty in walking, not elsewhere classified: Secondary | ICD-10-CM | POA: Insufficient documentation

## 2020-05-03 DIAGNOSIS — Z9181 History of falling: Secondary | ICD-10-CM | POA: Insufficient documentation
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
## 2020-05-04 ENCOUNTER — Ambulatory Visit (HOSPITAL_COMMUNITY): Payer: Medicaid Other | Admitting: Physical Therapy

## 2020-05-04 ENCOUNTER — Encounter (HOSPITAL_COMMUNITY): Payer: Self-pay | Admitting: Physical Therapy

## 2020-05-04 DIAGNOSIS — M545 Low back pain, unspecified: Secondary | ICD-10-CM

## 2020-05-04 DIAGNOSIS — G8929 Other chronic pain: Secondary | ICD-10-CM

## 2020-05-04 DIAGNOSIS — R262 Difficulty in walking, not elsewhere classified: Secondary | ICD-10-CM | POA: Diagnosis not present

## 2020-05-04 DIAGNOSIS — Z9181 History of falling: Secondary | ICD-10-CM | POA: Diagnosis not present

## 2020-05-04 DIAGNOSIS — M6281 Muscle weakness (generalized): Secondary | ICD-10-CM

## 2020-05-04 DIAGNOSIS — R2689 Other abnormalities of gait and mobility: Secondary | ICD-10-CM | POA: Diagnosis not present
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
## 2020-05-08 ENCOUNTER — Telehealth: Payer: Self-pay | Admitting: Psychiatry

## 2020-05-08 ENCOUNTER — Telehealth: Payer: Medicaid Other | Admitting: Psychiatry

## 2020-05-08 ENCOUNTER — Other Ambulatory Visit: Payer: Self-pay

## 2020-05-08 NOTE — Telephone Encounter (Signed)
Sent link for video visit through Epic. Patient did not sign in. Called the patient  for appointment scheduled today. The patient did not answer the phone. Left voice message to contact the office.  

## 2020-05-09 ENCOUNTER — Ambulatory Visit (HOSPITAL_COMMUNITY): Payer: Medicaid Other | Admitting: Physical Therapy

## 2020-05-09 ENCOUNTER — Other Ambulatory Visit: Payer: Self-pay

## 2020-05-09 ENCOUNTER — Encounter (HOSPITAL_COMMUNITY): Payer: Self-pay | Admitting: Physical Therapy

## 2020-05-09 DIAGNOSIS — M545 Low back pain, unspecified: Secondary | ICD-10-CM | POA: Diagnosis not present

## 2020-05-09 DIAGNOSIS — G8929 Other chronic pain: Secondary | ICD-10-CM | POA: Diagnosis not present

## 2020-05-09 DIAGNOSIS — M6281 Muscle weakness (generalized): Secondary | ICD-10-CM

## 2020-05-09 DIAGNOSIS — Z9181 History of falling: Secondary | ICD-10-CM | POA: Diagnosis not present

## 2020-05-09 DIAGNOSIS — R262 Difficulty in walking, not elsewhere classified: Secondary | ICD-10-CM

## 2020-05-09 DIAGNOSIS — R2689 Other abnormalities of gait and mobility: Secondary | ICD-10-CM | POA: Diagnosis not present
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
## 2020-05-10 ENCOUNTER — Telehealth (HOSPITAL_COMMUNITY): Payer: Self-pay | Admitting: Psychiatry

## 2020-05-11 ENCOUNTER — Other Ambulatory Visit: Payer: Self-pay

## 2020-05-11 ENCOUNTER — Ambulatory Visit (HOSPITAL_COMMUNITY): Payer: Medicaid Other | Admitting: Physical Therapy

## 2020-05-11 ENCOUNTER — Encounter (HOSPITAL_COMMUNITY): Payer: Self-pay | Admitting: Physical Therapy

## 2020-05-11 DIAGNOSIS — M545 Low back pain, unspecified: Secondary | ICD-10-CM

## 2020-05-11 DIAGNOSIS — R2689 Other abnormalities of gait and mobility: Secondary | ICD-10-CM | POA: Diagnosis not present

## 2020-05-11 DIAGNOSIS — M6281 Muscle weakness (generalized): Secondary | ICD-10-CM | POA: Diagnosis not present

## 2020-05-11 DIAGNOSIS — R262 Difficulty in walking, not elsewhere classified: Secondary | ICD-10-CM | POA: Diagnosis not present

## 2020-05-11 DIAGNOSIS — G8929 Other chronic pain: Secondary | ICD-10-CM

## 2020-05-11 DIAGNOSIS — Z9181 History of falling: Secondary | ICD-10-CM | POA: Diagnosis not present
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
## 2020-05-16 ENCOUNTER — Ambulatory Visit (HOSPITAL_COMMUNITY): Payer: Medicaid Other | Admitting: Physical Therapy

## 2020-05-16 ENCOUNTER — Telehealth (HOSPITAL_COMMUNITY): Payer: Self-pay | Admitting: Physical Therapy

## 2020-05-16 NOTE — Telephone Encounter (Signed)
pt cancelled appt for today because her dad got called into work and she has no transportation

## 2020-05-18 ENCOUNTER — Encounter (HOSPITAL_COMMUNITY): Payer: Medicaid Other | Admitting: Physical Therapy

## 2020-05-20 ENCOUNTER — Encounter (HOSPITAL_COMMUNITY): Payer: Self-pay

## 2020-05-20 ENCOUNTER — Emergency Department (HOSPITAL_COMMUNITY): Payer: Medicaid Other

## 2020-05-20 ENCOUNTER — Emergency Department (HOSPITAL_COMMUNITY)
Admission: EM | Admit: 2020-05-20 | Discharge: 2020-05-20 | Disposition: A | Discharge status: Home / Self Care | Payer: Medicaid Other | Attending: Emergency Medicine | Admitting: Emergency Medicine

## 2020-05-20 ENCOUNTER — Other Ambulatory Visit: Payer: Self-pay

## 2020-05-20 DIAGNOSIS — S99922A Unspecified injury of left foot, initial encounter: Secondary | ICD-10-CM | POA: Diagnosis present

## 2020-05-20 DIAGNOSIS — Z793 Long term (current) use of hormonal contraceptives: Secondary | ICD-10-CM | POA: Insufficient documentation

## 2020-05-20 DIAGNOSIS — W108XXA Fall (on) (from) other stairs and steps, initial encounter: Secondary | ICD-10-CM | POA: Diagnosis not present

## 2020-05-20 DIAGNOSIS — Z91040 Latex allergy status: Secondary | ICD-10-CM | POA: Diagnosis not present

## 2020-05-20 DIAGNOSIS — S93602A Unspecified sprain of left foot, initial encounter: Secondary | ICD-10-CM | POA: Diagnosis not present

## 2020-05-20 DIAGNOSIS — M7989 Other specified soft tissue disorders: Secondary | ICD-10-CM | POA: Diagnosis not present

## 2020-05-20 DIAGNOSIS — S9002XA Contusion of left ankle, initial encounter: Secondary | ICD-10-CM | POA: Diagnosis not present

## 2020-05-20 DIAGNOSIS — M79672 Pain in left foot: Secondary | ICD-10-CM | POA: Diagnosis not present

## 2020-05-20 DIAGNOSIS — Z79899 Other long term (current) drug therapy: Secondary | ICD-10-CM | POA: Insufficient documentation

## 2020-05-20 NOTE — ED Notes (Signed)
Report received 
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
## 2020-05-20 NOTE — Discharge Instructions (Signed)
Return if any problems.

## 2020-05-20 NOTE — ED Triage Notes (Signed)
Pt reports walks with walker and fell going down her steps on 11/16.  Reports pain, swelling, and bruising to left ankle.  Foot warm and dry, pedal pulse present, cap refill wnl.

## 2020-05-23 ENCOUNTER — Encounter (HOSPITAL_COMMUNITY): Payer: Medicaid Other | Admitting: Physical Therapy

## 2020-05-23 ENCOUNTER — Telehealth (HOSPITAL_COMMUNITY): Payer: Self-pay | Admitting: Physical Therapy

## 2020-05-23 NOTE — Telephone Encounter (Signed)
pt called to cx this appt due to no transportation

## 2020-05-24 ENCOUNTER — Ambulatory Visit (HOSPITAL_COMMUNITY): Payer: Medicaid Other | Admitting: Physical Therapy

## 2020-05-24 ENCOUNTER — Other Ambulatory Visit: Payer: Self-pay

## 2020-05-24 DIAGNOSIS — M6281 Muscle weakness (generalized): Secondary | ICD-10-CM | POA: Diagnosis not present

## 2020-05-24 DIAGNOSIS — Z9181 History of falling: Secondary | ICD-10-CM | POA: Diagnosis not present

## 2020-05-24 DIAGNOSIS — G8929 Other chronic pain: Secondary | ICD-10-CM | POA: Diagnosis not present

## 2020-05-24 DIAGNOSIS — M545 Low back pain, unspecified: Secondary | ICD-10-CM | POA: Diagnosis not present

## 2020-05-24 DIAGNOSIS — R262 Difficulty in walking, not elsewhere classified: Secondary | ICD-10-CM

## 2020-05-24 DIAGNOSIS — R2689 Other abnormalities of gait and mobility: Secondary | ICD-10-CM | POA: Diagnosis not present
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
## 2020-05-29 ENCOUNTER — Ambulatory Visit (HOSPITAL_COMMUNITY): Payer: Medicaid Other

## 2020-05-29 ENCOUNTER — Telehealth (HOSPITAL_COMMUNITY): Payer: Self-pay

## 2020-05-29 NOTE — Telephone Encounter (Signed)
pt called to cx this appt due to her dad was called in to work this morning and the pt had no way to her appt

## 2020-05-31 ENCOUNTER — Ambulatory Visit (HOSPITAL_COMMUNITY): Payer: Medicaid Other | Attending: Pediatrics | Admitting: Physical Therapy

## 2020-05-31 ENCOUNTER — Other Ambulatory Visit: Payer: Self-pay

## 2020-05-31 DIAGNOSIS — Z419 Encounter for procedure for purposes other than remedying health state, unspecified: Secondary | ICD-10-CM | POA: Diagnosis not present

## 2020-05-31 DIAGNOSIS — R262 Difficulty in walking, not elsewhere classified: Secondary | ICD-10-CM | POA: Diagnosis not present

## 2020-05-31 DIAGNOSIS — R2689 Other abnormalities of gait and mobility: Secondary | ICD-10-CM | POA: Insufficient documentation

## 2020-05-31 DIAGNOSIS — M6281 Muscle weakness (generalized): Secondary | ICD-10-CM | POA: Diagnosis not present

## 2020-05-31 DIAGNOSIS — Z9181 History of falling: Secondary | ICD-10-CM | POA: Insufficient documentation
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
## 2020-06-01 ENCOUNTER — Ambulatory Visit (HOSPITAL_COMMUNITY): Payer: Medicaid Other | Admitting: Physical Therapy

## 2020-06-05 ENCOUNTER — Encounter (HOSPITAL_COMMUNITY): Payer: Self-pay

## 2020-06-05 ENCOUNTER — Telehealth (HOSPITAL_COMMUNITY): Payer: Self-pay | Admitting: Physical Therapy

## 2020-06-05 ENCOUNTER — Ambulatory Visit (HOSPITAL_COMMUNITY): Payer: Medicaid Other | Admitting: Physical Therapy

## 2020-06-05 NOTE — Telephone Encounter (Signed)
Pt did not show for appointment.  Called and spoke to patient who states she was unaware of today's appt and did not get a text reminder.  Informed of next appt and also informed of insurance approval at reduced frequency.  Pt will decide if she would like 1X week for 6 weeks or 2X week for 3 weeks when she returns.   Lurena Nida, PTA/CLT [phone]

## 2020-06-08 ENCOUNTER — Other Ambulatory Visit: Payer: Self-pay

## 2020-06-08 ENCOUNTER — Ambulatory Visit (HOSPITAL_COMMUNITY): Payer: Medicaid Other | Admitting: Physical Therapy

## 2020-06-08 DIAGNOSIS — R2689 Other abnormalities of gait and mobility: Secondary | ICD-10-CM

## 2020-06-08 DIAGNOSIS — Z9181 History of falling: Secondary | ICD-10-CM | POA: Diagnosis not present

## 2020-06-08 DIAGNOSIS — M6281 Muscle weakness (generalized): Secondary | ICD-10-CM | POA: Diagnosis not present

## 2020-06-08 DIAGNOSIS — R262 Difficulty in walking, not elsewhere classified: Secondary | ICD-10-CM
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
## 2020-06-12 ENCOUNTER — Telehealth (HOSPITAL_COMMUNITY): Payer: Self-pay | Admitting: Physical Therapy

## 2020-06-12 ENCOUNTER — Ambulatory Visit (HOSPITAL_COMMUNITY): Payer: Medicaid Other | Admitting: Physical Therapy

## 2020-06-12 NOTE — Telephone Encounter (Signed)
pt cancelled today's appt due to she is out of town

## 2020-06-14 ENCOUNTER — Telehealth (HOSPITAL_COMMUNITY): Payer: Self-pay | Admitting: Physical Therapy

## 2020-06-14 ENCOUNTER — Ambulatory Visit (HOSPITAL_COMMUNITY): Payer: Medicaid Other | Admitting: Physical Therapy

## 2020-06-14 NOTE — Telephone Encounter (Signed)
pt called to cx since she is still out of town

## 2020-06-19 ENCOUNTER — Encounter (HOSPITAL_COMMUNITY): Payer: Self-pay | Admitting: Physical Therapy

## 2020-06-19 ENCOUNTER — Other Ambulatory Visit: Payer: Self-pay

## 2020-06-19 ENCOUNTER — Ambulatory Visit (HOSPITAL_COMMUNITY): Payer: Medicaid Other | Admitting: Physical Therapy

## 2020-06-19 DIAGNOSIS — R262 Difficulty in walking, not elsewhere classified: Secondary | ICD-10-CM

## 2020-06-19 DIAGNOSIS — R2689 Other abnormalities of gait and mobility: Secondary | ICD-10-CM | POA: Diagnosis not present

## 2020-06-19 DIAGNOSIS — M6281 Muscle weakness (generalized): Secondary | ICD-10-CM | POA: Diagnosis not present

## 2020-06-19 DIAGNOSIS — Z9181 History of falling: Secondary | ICD-10-CM | POA: Diagnosis not present
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
## 2020-06-20 ENCOUNTER — Ambulatory Visit (HOSPITAL_COMMUNITY): Payer: Medicaid Other | Admitting: Physical Therapy

## 2020-06-20 DIAGNOSIS — R262 Difficulty in walking, not elsewhere classified: Secondary | ICD-10-CM

## 2020-06-20 DIAGNOSIS — R2689 Other abnormalities of gait and mobility: Secondary | ICD-10-CM

## 2020-06-20 DIAGNOSIS — M6281 Muscle weakness (generalized): Secondary | ICD-10-CM

## 2020-06-20 DIAGNOSIS — Z9181 History of falling: Secondary | ICD-10-CM
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
## 2020-06-26 ENCOUNTER — Encounter (HOSPITAL_COMMUNITY): Payer: Self-pay | Admitting: Physical Therapy

## 2020-06-26 ENCOUNTER — Ambulatory Visit (HOSPITAL_COMMUNITY): Payer: Medicaid Other | Admitting: Physical Therapy

## 2020-06-26 ENCOUNTER — Other Ambulatory Visit: Payer: Self-pay

## 2020-06-26 DIAGNOSIS — M6281 Muscle weakness (generalized): Secondary | ICD-10-CM

## 2020-06-26 DIAGNOSIS — R2689 Other abnormalities of gait and mobility: Secondary | ICD-10-CM | POA: Diagnosis not present

## 2020-06-26 DIAGNOSIS — Z9181 History of falling: Secondary | ICD-10-CM | POA: Diagnosis not present

## 2020-06-26 DIAGNOSIS — R262 Difficulty in walking, not elsewhere classified: Secondary | ICD-10-CM | POA: Diagnosis not present
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
## 2020-06-26 NOTE — Patient Instructions (Signed)
Access Code: [email or web address] URL: [email or web address] Date: 06/26/2020 Prepared by: Greig Castilla Jonica Bickhart  Exercises Supine Bridge - 1 x daily - 7 x weekly - 2 sets - 20 reps Sidelying Hip Abduction - 1 x daily - 7 x weekly - 1 sets - 20 reps Prone Hip Extension - 1 x daily - 7 x weekly - 1 sets - 20 reps Quadruped Hip Extension Kicks - 1 x daily - 7 x weekly - 1 sets - 20 reps Oblique Bicycle Crunch - 1 x daily - 7 x weekly - 2 sets - 10 reps Supine Active Straight Leg Raise - 1 x daily - 7 x weekly - 2 sets - 10 reps

## 2020-06-28 ENCOUNTER — Ambulatory Visit (HOSPITAL_COMMUNITY): Payer: Medicaid Other | Admitting: Physical Therapy

## 2020-07-01 DIAGNOSIS — Z419 Encounter for procedure for purposes other than remedying health state, unspecified: Secondary | ICD-10-CM | POA: Diagnosis not present

## 2020-07-07 ENCOUNTER — Other Ambulatory Visit: Payer: Medicaid Other

## 2020-07-07 DIAGNOSIS — Z20822 Contact with and (suspected) exposure to covid-19: Secondary | ICD-10-CM | POA: Diagnosis not present

## 2020-07-11 LAB — NOVEL CORONAVIRUS, NAA: SARS-CoV-2, NAA: NOT DETECTED

## 2020-07-14 ENCOUNTER — Emergency Department (HOSPITAL_COMMUNITY)
Admission: EM | Admit: 2020-07-14 | Discharge: 2020-07-14 | Disposition: A | Discharge status: Home / Self Care | Payer: Medicaid Other | Attending: Emergency Medicine | Admitting: Emergency Medicine

## 2020-07-14 ENCOUNTER — Encounter (HOSPITAL_COMMUNITY): Payer: Self-pay | Admitting: *Deleted

## 2020-07-14 ENCOUNTER — Other Ambulatory Visit: Payer: Self-pay

## 2020-07-14 DIAGNOSIS — M79605 Pain in left leg: Secondary | ICD-10-CM | POA: Diagnosis not present

## 2020-07-14 DIAGNOSIS — R52 Pain, unspecified: Secondary | ICD-10-CM

## 2020-07-14 DIAGNOSIS — Z20822 Contact with and (suspected) exposure to covid-19: Secondary | ICD-10-CM | POA: Insufficient documentation

## 2020-07-14 DIAGNOSIS — M62830 Muscle spasm of back: Secondary | ICD-10-CM | POA: Insufficient documentation

## 2020-07-14 DIAGNOSIS — Z03818 Encounter for observation for suspected exposure to other biological agents ruled out: Secondary | ICD-10-CM | POA: Diagnosis not present

## 2020-07-14 DIAGNOSIS — M79604 Pain in right leg: Secondary | ICD-10-CM | POA: Diagnosis not present

## 2020-07-14 DIAGNOSIS — Z91040 Latex allergy status: Secondary | ICD-10-CM | POA: Insufficient documentation

## 2020-07-14 DIAGNOSIS — M545 Low back pain, unspecified: Secondary | ICD-10-CM | POA: Diagnosis not present

## 2020-07-14 LAB — URINALYSIS, ROUTINE W REFLEX MICROSCOPIC
Bacteria, UA: NONE SEEN
Bilirubin Urine: NEGATIVE
Glucose, UA: NEGATIVE mg/dL
Ketones, ur: NEGATIVE mg/dL
Leukocytes,Ua: NEGATIVE
Nitrite: NEGATIVE
Protein, ur: NEGATIVE mg/dL
RBC / HPF: >50 RBC/hpf — ABNORMAL HIGH (ref 0–5)
Specific Gravity, Urine: 1.006 (ref 1.005–1.030)
pH: 6 (ref 5.0–8.0)

## 2020-07-14 LAB — PREGNANCY, URINE: Preg Test, Ur: NEGATIVE

## 2020-07-14 LAB — RESP PANEL BY RT-PCR (FLU A&B, COVID) ARPGX2
Influenza A by PCR: NEGATIVE
Influenza B by PCR: NEGATIVE
SARS Coronavirus 2 by RT PCR: NEGATIVE

## 2020-07-14 LAB — POC URINE PREG, ED: Preg Test, Ur: NEGATIVE

## 2020-07-14 MED ORDER — MORPHINE SULFATE (PF) 4 MG/ML IV SOLN
6.0000 mg | Freq: Once | INTRAVENOUS | Status: AC
  Administered 2020-07-14: 6 mg via INTRAVENOUS
  Filled 2020-07-14: qty 2

## 2020-07-14 MED ORDER — DIAZEPAM 5 MG PO TABS
5.0000 mg | ORAL_TABLET | Freq: Once | ORAL | Status: AC
  Administered 2020-07-14: 5 mg via ORAL
  Filled 2020-07-14: qty 1

## 2020-07-14 MED ORDER — METHOCARBAMOL 500 MG PO TABS: 500.0000 mg | ORAL_TABLET | Freq: Three times a day (TID) | ORAL | 0 refills | Status: DC | PRN

## 2020-07-14 MED ORDER — SODIUM CHLORIDE 0.9 % IV BOLUS
500.0000 mL | Freq: Once | INTRAVENOUS | Status: AC
  Administered 2020-07-14: 500 mL via INTRAVENOUS

## 2020-07-14 MED ORDER — MORPHINE SULFATE (PF) 4 MG/ML IV SOLN
4.0000 mg | Freq: Once | INTRAVENOUS | Status: AC
  Administered 2020-07-14: 4 mg via INTRAMUSCULAR
  Filled 2020-07-14: qty 1

## 2020-07-14 NOTE — Discharge Instructions (Addendum)
You were seen in the emergency room today with lower back pain.  I have called in a medication to your pharmacy.  You cannot take this along with baclofen but if you find it helps you more than baclofen you can switch to this.  Your COVID and flu testing were negative.  Your urine test was also normal.  I am sending her urine for culture in case infection grows there and we can call to start antibiotics in the coming days.  Please follow closely with your primary care doctor and return to the emergency department any new or suddenly worsening symptoms.

## 2020-07-14 NOTE — ED Triage Notes (Signed)
Pt with back pain and bilateral leg pain since yesterday.  Also with covid exposure, pt's sister tested positive 07/07/20

## 2020-07-14 NOTE — ED Triage Notes (Signed)
Denies burning with urination 
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
## 2020-07-16 LAB — URINE CULTURE: Culture: <10000 — AB

## 2020-07-18 ENCOUNTER — Ambulatory Visit: Payer: Self-pay | Admitting: Adult Health

## 2020-08-01 DIAGNOSIS — Z419 Encounter for procedure for purposes other than remedying health state, unspecified: Secondary | ICD-10-CM | POA: Diagnosis not present

## 2020-08-15 ENCOUNTER — Other Ambulatory Visit: Payer: Self-pay | Admitting: Obstetrics and Gynecology

## 2020-08-15 DIAGNOSIS — Z3041 Encounter for surveillance of contraceptive pills: Secondary | ICD-10-CM
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
## 2020-08-28 ENCOUNTER — Other Ambulatory Visit: Payer: Self-pay

## 2020-08-28 ENCOUNTER — Encounter: Payer: Self-pay | Admitting: Obstetrics and Gynecology

## 2020-08-28 ENCOUNTER — Ambulatory Visit (INDEPENDENT_AMBULATORY_CARE_PROVIDER_SITE_OTHER): Payer: Medicaid Other | Admitting: Obstetrics and Gynecology

## 2020-08-28 ENCOUNTER — Other Ambulatory Visit (HOSPITAL_COMMUNITY)
Admission: RE | Admit: 2020-08-28 | Discharge: 2020-08-28 | Disposition: A | Discharge status: Home / Self Care | Payer: Medicaid Other | Source: Ambulatory Visit | Attending: Obstetrics and Gynecology | Admitting: Obstetrics and Gynecology

## 2020-08-28 VITALS — BP 120/74 | Ht 66.5 in | Wt 220.0 lb

## 2020-08-28 DIAGNOSIS — Z01419 Encounter for gynecological examination (general) (routine) without abnormal findings: Secondary | ICD-10-CM

## 2020-08-28 DIAGNOSIS — Z124 Encounter for screening for malignant neoplasm of cervix: Secondary | ICD-10-CM

## 2020-08-28 DIAGNOSIS — Z3041 Encounter for surveillance of contraceptive pills: Secondary | ICD-10-CM

## 2020-08-28 DIAGNOSIS — Z113 Encounter for screening for infections with a predominantly sexual mode of transmission: Secondary | ICD-10-CM | POA: Diagnosis not present

## 2020-08-28 DIAGNOSIS — Z8041 Family history of malignant neoplasm of ovary: Secondary | ICD-10-CM

## 2020-08-28 MED ORDER — LEVONORGESTREL-ETHINYL ESTRAD 0.1-20 MG-MCG PO TABS: 1.0000 | ORAL_TABLET | Freq: Every day | ORAL | 3 refills | Status: DC

## 2020-08-28 NOTE — Patient Instructions (Signed)
I value your feedback and you entrusting us with your care. If you get a [facility] patient survey, I would appreciate you taking the time to let us know about your experience today. Thank you! ? ? ?

## 2020-08-29 DIAGNOSIS — Z419 Encounter for procedure for purposes other than remedying health state, unspecified: Secondary | ICD-10-CM | POA: Diagnosis not present

## 2020-08-29 DIAGNOSIS — Z1371 Encounter for nonprocreative screening for genetic disease carrier status: Secondary | ICD-10-CM

## 2020-08-29 HISTORY — DX: Encounter for nonprocreative screening for genetic disease carrier status: Z13.71

## 2020-08-30 LAB — CYTOLOGY - PAP
Chlamydia: NEGATIVE
Comment: NEGATIVE
Comment: NORMAL
Diagnosis: NEGATIVE
Neisseria Gonorrhea: NEGATIVE

## 2020-09-13 ENCOUNTER — Other Ambulatory Visit: Payer: Self-pay

## 2020-09-13 ENCOUNTER — Encounter: Payer: Self-pay | Admitting: Adult Health

## 2020-09-13 ENCOUNTER — Ambulatory Visit (INDEPENDENT_AMBULATORY_CARE_PROVIDER_SITE_OTHER): Payer: Medicaid Other | Admitting: Adult Health

## 2020-09-13 VITALS — BP 110/72 | HR 76 | Temp 98.2°F | Resp 16 | Wt 234.8 lb

## 2020-09-13 DIAGNOSIS — S34109S Unspecified injury to unspecified level of lumbar spinal cord, sequela: Secondary | ICD-10-CM | POA: Diagnosis not present

## 2020-09-13 DIAGNOSIS — F339 Major depressive disorder, recurrent, unspecified: Secondary | ICD-10-CM | POA: Diagnosis not present

## 2020-09-13 DIAGNOSIS — G839 Paralytic syndrome, unspecified: Secondary | ICD-10-CM | POA: Diagnosis not present

## 2020-09-13 DIAGNOSIS — E559 Vitamin D deficiency, unspecified: Secondary | ICD-10-CM

## 2020-09-13 DIAGNOSIS — M792 Neuralgia and neuritis, unspecified: Secondary | ICD-10-CM

## 2020-09-13 DIAGNOSIS — F419 Anxiety disorder, unspecified: Secondary | ICD-10-CM | POA: Diagnosis not present

## 2020-09-13 DIAGNOSIS — G8220 Paraplegia, unspecified: Secondary | ICD-10-CM

## 2020-09-13 DIAGNOSIS — Z23 Encounter for immunization: Secondary | ICD-10-CM | POA: Diagnosis not present

## 2020-09-13 DIAGNOSIS — M62838 Other muscle spasm: Secondary | ICD-10-CM | POA: Diagnosis not present

## 2020-09-13 DIAGNOSIS — Z1159 Encounter for screening for other viral diseases: Secondary | ICD-10-CM

## 2020-09-13 DIAGNOSIS — F32A Depression, unspecified: Secondary | ICD-10-CM

## 2020-09-13 MED ORDER — ESCITALOPRAM OXALATE 5 MG PO TABS: 15.0000 mg | ORAL_TABLET | Freq: Every day | ORAL | 0 refills | Status: DC

## 2020-09-13 MED ORDER — AMITRIPTYLINE HCL 50 MG PO TABS
50.0000 mg | ORAL_TABLET | Freq: Every day | ORAL | 1 refills | Status: DC
Start: 2020-09-13 — End: 2021-04-27

## 2020-09-13 MED ORDER — GABAPENTIN 400 MG PO CAPS
800.0000 mg | ORAL_CAPSULE | Freq: Four times a day (QID) | ORAL | 0 refills | Status: DC
Start: 2020-09-13 — End: 2021-02-14

## 2020-09-13 MED ORDER — BACLOFEN 10 MG PO TABS: 10.0000 mg | ORAL_TABLET | Freq: Three times a day (TID) | ORAL | 0 refills | Status: AC | PRN
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
## 2020-09-13 NOTE — Patient Instructions (Signed)
Health Maintenance, Female Adopting a healthy lifestyle and getting preventive care are important in promoting health and wellness. Ask your health care provider about:  The right schedule for you to have regular tests and exams.  Things you can do on your own to prevent diseases and keep yourself healthy. What should I know about diet, weight, and exercise? Eat a healthy diet  Eat a diet that includes plenty of vegetables, fruits, low-fat dairy products, and lean protein.  Do not eat a lot of foods that are high in solid fats, added sugars, or sodium.   Maintain a healthy weight Body mass index (BMI) is used to identify weight problems. It estimates body fat based on height and weight. Your health care provider can help determine your BMI and help you achieve or maintain a healthy weight. Get regular exercise Get regular exercise. This is one of the most important things you can do for your health. Most adults should:  Exercise for at least 150 minutes each week. The exercise should increase your heart rate and make you sweat (moderate-intensity exercise).  Do strengthening exercises at least twice a week. This is in addition to the moderate-intensity exercise.  Spend less time sitting. Even light physical activity can be beneficial. Watch cholesterol and blood lipids Have your blood tested for lipids and cholesterol at 22 years of age, then have this test every 5 years. Have your cholesterol levels checked more often if:  Your lipid or cholesterol levels are high.  You are older than 22 years of age.  You are at high risk for heart disease. What should I know about cancer screening? Depending on your health history and family history, you may need to have cancer screening at various ages. This may include screening for:  Breast cancer.  Cervical cancer.  Colorectal cancer.  Skin cancer.  Lung cancer. What should I know about heart disease, diabetes, and high blood  pressure? Blood pressure and heart disease  High blood pressure causes heart disease and increases the risk of stroke. This is more likely to develop in people who have high blood pressure readings, are of African descent, or are overweight.  Have your blood pressure checked: ? Every 3-5 years if you are 18-39 years of age. ? Every year if you are 40 years old or older. Diabetes Have regular diabetes screenings. This checks your fasting blood sugar level. Have the screening done:  Once every three years after age 40 if you are at a normal weight and have a low risk for diabetes.  More often and at a younger age if you are overweight or have a high risk for diabetes. What should I know about preventing infection? Hepatitis B If you have a higher risk for hepatitis B, you should be screened for this virus. Talk with your health care provider to find out if you are at risk for hepatitis B infection. Hepatitis C Testing is recommended for:  Everyone born from 1945 through 1965.  Anyone with known risk factors for hepatitis C. Sexually transmitted infections (STIs)  Get screened for STIs, including gonorrhea and chlamydia, if: ? You are sexually active and are younger than 22 years of age. ? You are older than 22 years of age and your health care provider tells you that you are at risk for this type of infection. ? Your sexual activity has changed since you were last screened, and you are at increased risk for chlamydia or gonorrhea. Ask your health care provider   if you are at risk.  Ask your health care provider about whether you are at high risk for HIV. Your health care provider may recommend a prescription medicine to help prevent HIV infection. If you choose to take medicine to prevent HIV, you should first get tested for HIV. You should then be tested every 3 months for as long as you are taking the medicine. Pregnancy  If you are about to stop having your period (premenopausal) and  you may become pregnant, seek counseling before you get pregnant.  Take 400 to 800 micrograms (mcg) of folic acid every day if you become pregnant.  Ask for birth control (contraception) if you want to prevent pregnancy. Osteoporosis and menopause Osteoporosis is a disease in which the bones lose minerals and strength with aging. This can result in bone fractures. If you are 39 years old or older, or if you are at risk for osteoporosis and fractures, ask your health care provider if you should:  Be screened for bone loss.  Take a calcium or vitamin D supplement to lower your risk of fractures.  Be given hormone replacement therapy (HRT) to treat symptoms of menopause. Follow these instructions at home: Lifestyle  Do not use any products that contain nicotine or tobacco, such as cigarettes, e-cigarettes, and chewing tobacco. If you need help quitting, ask your health care provider.  Do not use street drugs.  Do not share needles.  Ask your health care provider for help if you need support or information about quitting drugs. Alcohol use  Do not drink alcohol if: ? Your health care provider tells you not to drink. ? You are pregnant, may be pregnant, or are planning to become pregnant.  If you drink alcohol: ? Limit how much you use to 0-1 drink a day. ? Limit intake if you are breastfeeding.  Be aware of how much alcohol is in your drink. In the [place] one drink equals one 12 oz bottle of beer (355 mL), one 5 oz glass of wine (148 mL), or one 1 oz glass of hard liquor (44 mL). General instructions  Schedule regular health, dental, and eye exams.  Stay current with your vaccines.  Tell your health care provider if: ? You often feel depressed. ? You have ever been abused or do not feel safe at home. Summary  Adopting a healthy lifestyle and getting preventive care are important in promoting health and wellness.  Follow your health care provider's instructions about healthy  diet, exercising, and getting tested or screened for diseases.  Follow your health care provider's instructions on monitoring your cholesterol and blood pressure. This information is not intended to replace advice given to you by your health care provider. Make sure you discuss any questions you have with your health care provider. Document Revised: 06/10/2018 Document Reviewed: 06/10/2018 Elsevier Patient Education  2021 Elsevier Inc. Escitalopram Tablets What is this medicine? ESCITALOPRAM (es sye TAL oh pram) is used to treat depression and certain types of anxiety. This medicine may be used for other purposes; ask your health care provider or pharmacist if you have questions. COMMON BRAND NAME(S): Lexapro What should I tell my health care provider before I take this medicine? They need to know if you have any of these conditions:  bipolar disorder or a family history of bipolar disorder  diabetes  glaucoma  heart disease  kidney or liver disease  receiving electroconvulsive therapy  seizures (convulsions)  suicidal thoughts, plans, or attempt by you or a  family member  an unusual or allergic reaction to escitalopram, the related drug citalopram, other medicines, foods, dyes, or preservatives  pregnant or trying to become pregnant  breast-feeding How should I use this medicine? Take this medicine by mouth with a glass of water. Follow the directions on the prescription label. You can take it with or without food. If it upsets your stomach, take it with food. Take your medicine at regular intervals. Do not take it more often than directed. Do not stop taking this medicine suddenly except upon the advice of your doctor. Stopping this medicine too quickly may cause serious side effects or your condition may worsen. A special MedGuide will be given to you by the pharmacist with each prescription and refill. Be sure to read this information carefully each time. Talk to your  pediatrician regarding the use of this medicine in children. Special care may be needed. Overdosage: If you think you have taken too much of this medicine contact a poison control center or emergency room at once. NOTE: This medicine is only for you. Do not share this medicine with others. What if I miss a dose? If you miss a dose, take it as soon as you can. If it is almost time for your next dose, take only that dose. Do not take double or extra doses. What may interact with this medicine? Do not take this medicine with any of the following medications:  certain medicines for fungal infections like fluconazole, itraconazole, ketoconazole, posaconazole, voriconazole  cisapride  citalopram  dronedarone  linezolid  MAOIs like Carbex, Eldepryl, Marplan, Nardil, and Parnate  methylene blue (injected into a vein)  pimozide  thioridazine This medicine may also interact with the following medications:  alcohol  amphetamines  aspirin and aspirin-like medicines  carbamazepine  certain medicines for depression, anxiety, or psychotic disturbances  certain medicines for migraine headache like almotriptan, eletriptan, frovatriptan, naratriptan, rizatriptan, sumatriptan, zolmitriptan  certain medicines for sleep  certain medicines that treat or prevent blood clots like warfarin, enoxaparin, dalteparin  cimetidine  diuretics  dofetilide  fentanyl  furazolidone  isoniazid  lithium  metoprolol  NSAIDs, medicines for pain and inflammation, like ibuprofen or naproxen  other medicines that prolong the QT interval (cause an abnormal heart rhythm)  procarbazine  rasagiline  supplements like St. John's wort, kava kava, valerian  tramadol  tryptophan  ziprasidone This list may not describe all possible interactions. Give your health care provider a list of all the medicines, herbs, non-prescription drugs, or dietary supplements you use. Also tell them if you smoke,  drink alcohol, or use illegal drugs. Some items may interact with your medicine. What should I watch for while using this medicine? Tell your doctor if your symptoms do not get better or if they get worse. Visit your doctor or health care professional for regular checks on your progress. Because it may take several weeks to see the full effects of this medicine, it is important to continue your treatment as prescribed by your doctor. Patients and their families should watch out for new or worsening thoughts of suicide or depression. Also watch out for sudden changes in feelings such as feeling anxious, agitated, panicky, irritable, hostile, aggressive, impulsive, severely restless, overly excited and hyperactive, or not being able to sleep. If this happens, especially at the beginning of treatment or after a change in dose, call your health care professional. Bonita Quin may get drowsy or dizzy. Do not drive, use machinery, or do anything that needs mental alertness  until you know how this medicine affects you. Do not stand or sit up quickly, especially if you are an older patient. This reduces the risk of dizzy or fainting spells. Alcohol may interfere with the effect of this medicine. Avoid alcoholic drinks. Your mouth may get dry. Chewing sugarless gum or sucking hard candy, and drinking plenty of water may help. Contact your doctor if the problem does not go away or is severe. What side effects may I notice from receiving this medicine? Side effects that you should report to your doctor or health care professional as soon as possible:  allergic reactions like skin rash, itching or hives, swelling of the face, lips, or tongue  anxious  black, tarry stools  changes in vision  confusion  elevated mood, decreased need for sleep, racing thoughts, impulsive behavior  eye pain  fast, irregular heartbeat  feeling faint or lightheaded, falls  feeling agitated, angry, or irritable  hallucination, loss  of contact with reality  loss of balance or coordination  loss of memory  painful or prolonged erections  restlessness, pacing, inability to keep still  seizures  stiff muscles  suicidal thoughts or other mood changes  trouble sleeping  unusual bleeding or bruising  unusually weak or tired  vomiting Side effects that usually do not require medical attention (report to your doctor or health care professional if they continue or are bothersome):  changes in appetite  change in sex drive or performance  headache  increased sweating  indigestion, nausea  tremors This list may not describe all possible side effects. Call your doctor for medical advice about side effects. You may report side effects to FDA at 1-800-FDA-1088. Where should I keep my medicine? Keep out of reach of children. Store at room temperature between 15 and 30 degrees C (59 and 86 degrees F). Throw away any unused medicine after the expiration date. NOTE: This sheet is a summary. It may not cover all possible information. If you have questions about this medicine, talk to your doctor, pharmacist, or health care provider.  2021 Elsevier/Gold Standard (2020-05-08 09:53:34)

## 2020-09-14 ENCOUNTER — Other Ambulatory Visit: Payer: Self-pay | Admitting: Adult Health

## 2020-09-14 DIAGNOSIS — E559 Vitamin D deficiency, unspecified: Secondary | ICD-10-CM

## 2020-09-14 DIAGNOSIS — E7800 Pure hypercholesterolemia, unspecified: Secondary | ICD-10-CM

## 2020-09-14 LAB — COMPREHENSIVE METABOLIC PANEL
ALT: 15 IU/L (ref 0–32)
AST: 19 IU/L (ref 0–40)
Albumin/Globulin Ratio: 1.7 (ref 1.2–2.2)
Albumin: 4.1 g/dL (ref 3.9–5.0)
Alkaline Phosphatase: 87 IU/L (ref 44–121)
BUN/Creatinine Ratio: 15 (ref 9–23)
BUN: 9 mg/dL (ref 6–20)
Bilirubin Total: <0.2 mg/dL (ref 0.0–1.2)
CO2: 21 mmol/L (ref 20–29)
Calcium: 9.1 mg/dL (ref 8.7–10.2)
Chloride: 101 mmol/L (ref 96–106)
Creatinine, Ser: 0.6 mg/dL (ref 0.57–1.00)
Globulin, Total: 2.4 g/dL (ref 1.5–4.5)
Glucose: 85 mg/dL (ref 65–99)
Potassium: 4.2 mmol/L (ref 3.5–5.2)
Sodium: 138 mmol/L (ref 134–144)
Total Protein: 6.5 g/dL (ref 6.0–8.5)
eGFR: 131 mL/min/{1.73_m2} (ref >59)

## 2020-09-14 LAB — LIPID PANEL
Chol/HDL Ratio: 6 ratio — ABNORMAL HIGH (ref 0.0–4.4)
Cholesterol, Total: 205 mg/dL — ABNORMAL HIGH (ref 100–199)
HDL: 34 mg/dL — ABNORMAL LOW (ref >39)
LDL Chol Calc (NIH): 118 mg/dL — ABNORMAL HIGH (ref 0–99)
Triglycerides: 305 mg/dL — ABNORMAL HIGH (ref 0–149)
VLDL Cholesterol Cal: 53 mg/dL — ABNORMAL HIGH (ref 5–40)

## 2020-09-14 LAB — CBC WITH DIFFERENTIAL/PLATELET
Basophils Absolute: 0 10*3/uL (ref 0.0–0.2)
Basos: 0 %
EOS (ABSOLUTE): 0.2 10*3/uL (ref 0.0–0.4)
Eos: 2 %
Hematocrit: 41.2 % (ref 34.0–46.6)
Hemoglobin: 13.4 g/dL (ref 11.1–15.9)
Immature Grans (Abs): 0 10*3/uL (ref 0.0–0.1)
Immature Granulocytes: 0 %
Lymphocytes Absolute: 1.9 10*3/uL (ref 0.7–3.1)
Lymphs: 29 %
MCH: 28.9 pg (ref 26.6–33.0)
MCHC: 32.5 g/dL (ref 31.5–35.7)
MCV: 89 fL (ref 79–97)
Monocytes Absolute: 0.6 10*3/uL (ref 0.1–0.9)
Monocytes: 9 %
Neutrophils Absolute: 4 10*3/uL (ref 1.4–7.0)
Neutrophils: 60 %
Platelets: 316 10*3/uL (ref 150–450)
RBC: 4.64 x10E6/uL (ref 3.77–5.28)
RDW: 12 % (ref 11.7–15.4)
WBC: 6.7 10*3/uL (ref 3.4–10.8)

## 2020-09-14 LAB — HEPATITIS C ANTIBODY: Hep C Virus Ab: <0.1 s/co ratio (ref 0.0–0.9)

## 2020-09-14 LAB — TSH: TSH: 1.11 u[IU]/mL (ref 0.450–4.500)

## 2020-09-14 LAB — VITAMIN D 25 HYDROXY (VIT D DEFICIENCY, FRACTURES): Vit D, 25-Hydroxy: 24.4 ng/mL — ABNORMAL LOW (ref 30.0–100.0)

## 2020-09-14 MED ORDER — VITAMIN D (ERGOCALCIFEROL) 1.25 MG (50000 UNIT) PO CAPS: 50000.0000 [IU] | ORAL_CAPSULE | ORAL | 0 refills | Status: DC

## 2020-09-14 NOTE — Progress Notes (Signed)
Meds ordered this encounter  Medications  . Vitamin D, Ergocalciferol, (DRISDOL) 1.25 MG (50000 UNIT) CAPS capsule    Sig: Take 1 capsule (50,000 Units total) by mouth every 7 (seven) days. (taking one tablet per week) walk in lab in office 1-2 weeks after completing prescription.    Dispense:  12 capsule    Refill:  0   Orders Placed This Encounter  Procedures  . VITAMIN D 25 Hydroxy (Vit-D Deficiency, Fractures)  . Lipid Panel w/o Chol/HDL Ratio   

## 2020-09-14 NOTE — Progress Notes (Signed)
CBC, CMP, TSH for thyroid are all within normal limits.   Was she fasting for cholesterol ?  Total cholesterol and LDL elevated.  Discuss lifestyle modification with patient e.g. increase exercise, fiber, fruits, vegetables, lean meat, and omega 3/fish intake and decrease saturated fat.  If patient following strict diet and exercise program already please schedule follow up appointment with primary care physician   Vitamin  D is low, this can contribute to poor sleep and fatigue, will send in prescription for Vitamin D at 50,000 units by mouth once every 7 days/(once weekly) for 12 weeks. Advise recheck lab Vitamin D in 1-2 weeks after completing vitamin d prescription. Lab iis walk in and is closed during lunch during regular office hours.   Hepatitis C antibody is negative.   Meds ordered this encounter Medications  Vitamin D, Ergocalciferol, (DRISDOL) 1.25 MG (50000 UNIT) CAPS capsule   Sig: Take 1 capsule (50,000 Units total) by mouth every 7 (seven) days. (taking one tablet per week) walk in lab in office 1-2 weeks after completing prescription.   Dispense:  12 capsule   Refill:  0

## 2020-09-18 ENCOUNTER — Encounter: Payer: Self-pay | Admitting: Adult Health

## 2020-09-18 DIAGNOSIS — Z23 Encounter for immunization: Secondary | ICD-10-CM | POA: Insufficient documentation

## 2020-09-18 DIAGNOSIS — M62838 Other muscle spasm: Secondary | ICD-10-CM | POA: Insufficient documentation

## 2020-09-18 DIAGNOSIS — M792 Neuralgia and neuritis, unspecified: Secondary | ICD-10-CM | POA: Insufficient documentation

## 2020-09-18 DIAGNOSIS — F339 Major depressive disorder, recurrent, unspecified: Secondary | ICD-10-CM | POA: Insufficient documentation

## 2020-09-19 ENCOUNTER — Encounter: Payer: Self-pay | Admitting: Obstetrics and Gynecology

## 2020-09-26 ENCOUNTER — Telehealth: Payer: Self-pay | Admitting: Obstetrics and Gynecology

## 2020-09-26 NOTE — Telephone Encounter (Signed)
Pt aware of neg MyRisk results. IBIS=16.3%/riskscore=16%.  Patient understands these results only apply to her and her children, and this is not indicative of genetic testing results of her other family members. It is recommended that her other family members have genetic testing done.  Pt also understands negative genetic testing doesn't mean she will never get any of these cancers.   Hard copy mailed to pt. F/u prn.

## 2020-09-29 DIAGNOSIS — Z419 Encounter for procedure for purposes other than remedying health state, unspecified: Secondary | ICD-10-CM | POA: Diagnosis not present

## 2020-10-26 ENCOUNTER — Telehealth: Payer: Self-pay

## 2020-10-26 NOTE — Telephone Encounter (Signed)
Copied from CRM [id number]. Topic: Appointment Scheduling - Scheduling Inquiry for Clinic >> Oct 26, 2020  1:35 PM Pawlus, Angela [name] wrote: Reason for CRM: Pt stated she took an at home Covid test which was negative, pt made an appt on 5/9 since she hasn't been feeling well / has possible sinus issues. Please advise if you would prefer to see the Pt virtually.

## 2020-10-29 DIAGNOSIS — Z419 Encounter for procedure for purposes other than remedying health state, unspecified: Secondary | ICD-10-CM | POA: Diagnosis not present

## 2020-10-31 NOTE — Telephone Encounter (Signed)
If symptoms persisting, can schedule virtual visit and should retest for COVID.

## 2020-11-06 ENCOUNTER — Other Ambulatory Visit: Payer: Self-pay | Admitting: Obstetrics and Gynecology

## 2020-11-06 ENCOUNTER — Ambulatory Visit: Payer: Medicaid Other | Admitting: Family Medicine

## 2020-11-06 DIAGNOSIS — Z3041 Encounter for surveillance of contraceptive pills: Secondary | ICD-10-CM

## 2020-11-06 NOTE — Telephone Encounter (Signed)
Called pharmacy and 1 yr supply send at annual is in there system.
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
## 2020-11-06 NOTE — Telephone Encounter (Signed)
Pls check with pharm bc 1 yr Rx sent 2/22

## 2020-11-29 DIAGNOSIS — Z419 Encounter for procedure for purposes other than remedying health state, unspecified: Secondary | ICD-10-CM | POA: Diagnosis not present

## 2020-12-12 DIAGNOSIS — H5213 Myopia, bilateral: Secondary | ICD-10-CM | POA: Diagnosis not present

## 2020-12-21 ENCOUNTER — Other Ambulatory Visit: Payer: Self-pay | Admitting: Adult Health

## 2020-12-21 DIAGNOSIS — E559 Vitamin D deficiency, unspecified: Secondary | ICD-10-CM

## 2020-12-21 DIAGNOSIS — F339 Major depressive disorder, recurrent, unspecified: Secondary | ICD-10-CM

## 2020-12-21 DIAGNOSIS — M792 Neuralgia and neuritis, unspecified: Secondary | ICD-10-CM

## 2020-12-21 DIAGNOSIS — F32A Depression, unspecified: Secondary | ICD-10-CM

## 2020-12-21 DIAGNOSIS — S34109S Unspecified injury to unspecified level of lumbar spinal cord, sequela: Secondary | ICD-10-CM

## 2020-12-21 DIAGNOSIS — Z1159 Encounter for screening for other viral diseases: Secondary | ICD-10-CM

## 2020-12-21 DIAGNOSIS — G839 Paralytic syndrome, unspecified: Secondary | ICD-10-CM

## 2020-12-21 DIAGNOSIS — Z23 Encounter for immunization: Secondary | ICD-10-CM

## 2020-12-21 DIAGNOSIS — M62838 Other muscle spasm: Secondary | ICD-10-CM

## 2020-12-21 DIAGNOSIS — G8220 Paraplegia, unspecified: Secondary | ICD-10-CM

## 2020-12-21 DIAGNOSIS — F419 Anxiety disorder, unspecified: Secondary | ICD-10-CM

## 2020-12-29 DIAGNOSIS — Z419 Encounter for procedure for purposes other than remedying health state, unspecified: Secondary | ICD-10-CM | POA: Diagnosis not present

## 2021-01-04 DIAGNOSIS — S34109D Unspecified injury to unspecified level of lumbar spinal cord, subsequent encounter: Secondary | ICD-10-CM | POA: Diagnosis not present

## 2021-01-08 DIAGNOSIS — M7740 Metatarsalgia, unspecified foot: Secondary | ICD-10-CM | POA: Diagnosis not present

## 2021-01-08 DIAGNOSIS — M79674 Pain in right toe(s): Secondary | ICD-10-CM | POA: Diagnosis not present

## 2021-01-10 DIAGNOSIS — Z203 Contact with and (suspected) exposure to rabies: Secondary | ICD-10-CM | POA: Insufficient documentation

## 2021-01-10 DIAGNOSIS — Z89421 Acquired absence of other right toe(s): Secondary | ICD-10-CM | POA: Diagnosis not present

## 2021-01-10 DIAGNOSIS — S61551A Open bite of right wrist, initial encounter: Secondary | ICD-10-CM | POA: Insufficient documentation

## 2021-01-10 DIAGNOSIS — W540XXA Bitten by dog, initial encounter: Secondary | ICD-10-CM | POA: Insufficient documentation

## 2021-01-10 DIAGNOSIS — S6991XA Unspecified injury of right wrist, hand and finger(s), initial encounter: Secondary | ICD-10-CM | POA: Diagnosis present

## 2021-01-11 ENCOUNTER — Emergency Department (HOSPITAL_COMMUNITY)
Admission: EM | Admit: 2021-01-11 | Discharge: 2021-01-11 | Disposition: A | Discharge status: Home / Self Care | Payer: Medicaid Other | Attending: Emergency Medicine | Admitting: Emergency Medicine

## 2021-01-11 ENCOUNTER — Encounter (HOSPITAL_COMMUNITY): Payer: Self-pay | Admitting: Emergency Medicine

## 2021-01-11 ENCOUNTER — Emergency Department (HOSPITAL_COMMUNITY): Payer: Medicaid Other

## 2021-01-11 ENCOUNTER — Other Ambulatory Visit: Payer: Self-pay

## 2021-01-11 DIAGNOSIS — S61551A Open bite of right wrist, initial encounter: Secondary | ICD-10-CM | POA: Diagnosis not present

## 2021-01-11 DIAGNOSIS — W540XXA Bitten by dog, initial encounter: Secondary | ICD-10-CM

## 2021-01-11 MED ORDER — IBUPROFEN 800 MG PO TABS: 800.0000 mg | ORAL_TABLET | Freq: Four times a day (QID) | ORAL | 0 refills | Status: DC | PRN

## 2021-01-11 MED ORDER — IBUPROFEN 800 MG PO TABS
800.0000 mg | ORAL_TABLET | Freq: Once | ORAL | Status: AC
  Administered 2021-01-11: 800 mg via ORAL
  Filled 2021-01-11: qty 1

## 2021-01-11 MED ORDER — AMOXICILLIN-POT CLAVULANATE 875-125 MG PO TABS: 1.0000 | ORAL_TABLET | Freq: Two times a day (BID) | ORAL | 0 refills | Status: DC

## 2021-01-11 MED ORDER — ACETAMINOPHEN 500 MG PO TABS
1000.0000 mg | ORAL_TABLET | Freq: Once | ORAL | Status: AC
  Administered 2021-01-11: 1000 mg via ORAL
  Filled 2021-01-11: qty 2
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
## 2021-01-11 NOTE — ED Triage Notes (Signed)
Pt c/o dog bite from personal dog to right wrist. Per pt dog is up to date on rabies vaccine.

## 2021-01-17 ENCOUNTER — Telehealth: Payer: Self-pay

## 2021-01-17 MED ORDER — FLUCONAZOLE 150 MG PO TABS: 150.0000 mg | ORAL_TABLET | Freq: Every day | ORAL | 0 refills | Status: DC

## 2021-01-17 NOTE — Telephone Encounter (Signed)
Medication sent into the pharmacy. Patient advised.  

## 2021-01-17 NOTE — Telephone Encounter (Signed)
Called and spoke with patient on the phone who states that she was seen in the ED for dog bite on 01/11/21, patient states that she was started on antibiotic and now believes she has a yeast infection. Patient reports vaginal discharge, burning and itching. Patient would like prescription to treat for infection sent to [facility]. KW

## 2021-01-17 NOTE — Telephone Encounter (Signed)
Copied from CRM [id number]. Topic: General - Other >> Jan 17, 2021 12:29 PM Gaetana Michaelis A wrote: Reason for CRM: Patient was recently bitten by a dog and prescribed anabiotics by Emergency Room Staff at [facility]  Patient has recently began to experience personal discomfort and would like to be prescribed medication to help with the discomfort   Patient has experienced discomfort for roughly two days   Please contact further if needed

## 2021-01-29 DIAGNOSIS — Z419 Encounter for procedure for purposes other than remedying health state, unspecified: Secondary | ICD-10-CM | POA: Diagnosis not present

## 2021-02-05 ENCOUNTER — Other Ambulatory Visit: Payer: Self-pay | Admitting: Family Medicine

## 2021-02-05 DIAGNOSIS — S34109S Unspecified injury to unspecified level of lumbar spinal cord, sequela: Secondary | ICD-10-CM

## 2021-02-05 DIAGNOSIS — F419 Anxiety disorder, unspecified: Secondary | ICD-10-CM

## 2021-02-05 DIAGNOSIS — E559 Vitamin D deficiency, unspecified: Secondary | ICD-10-CM

## 2021-02-05 DIAGNOSIS — F339 Major depressive disorder, recurrent, unspecified: Secondary | ICD-10-CM

## 2021-02-05 DIAGNOSIS — Z23 Encounter for immunization: Secondary | ICD-10-CM

## 2021-02-05 DIAGNOSIS — F32A Depression, unspecified: Secondary | ICD-10-CM

## 2021-02-05 DIAGNOSIS — G839 Paralytic syndrome, unspecified: Secondary | ICD-10-CM

## 2021-02-05 DIAGNOSIS — M62838 Other muscle spasm: Secondary | ICD-10-CM

## 2021-02-05 DIAGNOSIS — M792 Neuralgia and neuritis, unspecified: Secondary | ICD-10-CM

## 2021-02-05 DIAGNOSIS — Z1159 Encounter for screening for other viral diseases: Secondary | ICD-10-CM

## 2021-02-05 DIAGNOSIS — G8220 Paraplegia, unspecified: Secondary | ICD-10-CM

## 2021-02-10 ENCOUNTER — Other Ambulatory Visit: Payer: Self-pay | Admitting: Adult Health

## 2021-02-10 DIAGNOSIS — F339 Major depressive disorder, recurrent, unspecified: Secondary | ICD-10-CM

## 2021-02-10 DIAGNOSIS — M792 Neuralgia and neuritis, unspecified: Secondary | ICD-10-CM

## 2021-02-10 DIAGNOSIS — Z1159 Encounter for screening for other viral diseases: Secondary | ICD-10-CM

## 2021-02-10 DIAGNOSIS — F419 Anxiety disorder, unspecified: Secondary | ICD-10-CM

## 2021-02-10 DIAGNOSIS — M62838 Other muscle spasm: Secondary | ICD-10-CM

## 2021-02-10 DIAGNOSIS — Z23 Encounter for immunization: Secondary | ICD-10-CM

## 2021-02-10 DIAGNOSIS — E559 Vitamin D deficiency, unspecified: Secondary | ICD-10-CM

## 2021-02-10 DIAGNOSIS — F32A Depression, unspecified: Secondary | ICD-10-CM

## 2021-02-10 DIAGNOSIS — S34109S Unspecified injury to unspecified level of lumbar spinal cord, sequela: Secondary | ICD-10-CM

## 2021-02-10 DIAGNOSIS — G8220 Paraplegia, unspecified: Secondary | ICD-10-CM

## 2021-02-10 DIAGNOSIS — G839 Paralytic syndrome, unspecified: Secondary | ICD-10-CM

## 2021-02-12 ENCOUNTER — Other Ambulatory Visit: Payer: Self-pay | Admitting: Adult Health

## 2021-02-12 DIAGNOSIS — Z1159 Encounter for screening for other viral diseases: Secondary | ICD-10-CM

## 2021-02-12 DIAGNOSIS — S34109S Unspecified injury to unspecified level of lumbar spinal cord, sequela: Secondary | ICD-10-CM

## 2021-02-12 DIAGNOSIS — M792 Neuralgia and neuritis, unspecified: Secondary | ICD-10-CM

## 2021-02-12 DIAGNOSIS — G8220 Paraplegia, unspecified: Secondary | ICD-10-CM

## 2021-02-12 DIAGNOSIS — F32A Depression, unspecified: Secondary | ICD-10-CM

## 2021-02-12 DIAGNOSIS — E559 Vitamin D deficiency, unspecified: Secondary | ICD-10-CM

## 2021-02-12 DIAGNOSIS — M62838 Other muscle spasm: Secondary | ICD-10-CM

## 2021-02-12 DIAGNOSIS — G839 Paralytic syndrome, unspecified: Secondary | ICD-10-CM

## 2021-02-12 DIAGNOSIS — F339 Major depressive disorder, recurrent, unspecified: Secondary | ICD-10-CM

## 2021-02-12 DIAGNOSIS — Z23 Encounter for immunization: Secondary | ICD-10-CM

## 2021-02-12 DIAGNOSIS — F419 Anxiety disorder, unspecified: Secondary | ICD-10-CM

## 2021-02-12 NOTE — Telephone Encounter (Signed)
Medication Refill - Medication: escitalopram (LEXAPRO) 5 MG tablet   Has the patient contacted their pharmacy? yes (Agent: If no, request that the patient contact the pharmacy for the refill.) (Agent: If yes, when and what did the pharmacy advise?)contact pcp  Preferred Pharmacy (with phone number or street name):  [facility] [address] [place] - [id number] [place] [address] Phone:  [phone]  Fax:  [phone]      Agent: Please be advised that RX refills may take up to 3 business days. We ask that you follow-up with your pharmacy.

## 2021-02-13 ENCOUNTER — Ambulatory Visit: Payer: Self-pay | Admitting: *Deleted

## 2021-02-13 NOTE — Telephone Encounter (Signed)
Patient is calling to report she has infection- possible ingrown toenail. Patient states she noticed her R great toe is red and draining- pus yesterday- today more clear. Present for at least 2 days. Patient will be in [facility] tomorrow- she has appointment (orthodontist) at 2:30 and would like to be seen before or after. Patient does not have transportation today. Advised patient I would send request- but she may be advised to go to UC if she can not be scheduled at office. Please call her back.

## 2021-02-13 NOTE — Telephone Encounter (Signed)
   Notes to clinic:  Patient states that she has been out of the medication for a week States that she is only taking 3 tabs at night and not sure why she ran out Per pharmacy they can't fill script until 02/16/2021   Requested Prescriptions  Pending Prescriptions Disp Refills   escitalopram (LEXAPRO) 5 MG tablet 180 tablet 0    Sig: Take 3 tablets (15 mg total) by mouth daily.     Psychiatry:  Antidepressants - SSRI Passed - 02/13/2021  8:18 AM      Passed - Completed PHQ-2 or PHQ-9 in the last 360 days      Passed - Valid encounter within last 6 months    Recent Outpatient Visits           5 months ago Paraplegia [facility])   [facility] Flinchum, Eula Fried, FNP

## 2021-02-13 NOTE — Telephone Encounter (Signed)
Pt called in stating she believes she has a ingrown toe nail, pt states she has had something like this before and has had a toe amputated, and wanted to see about speaking with someone and possibly getting antidotic. Please advise.  Reason for Disposition  Yellow pus seen in skin around toenail (cuticle area), or pus seen under toenail  Answer Assessment - Initial Assessment Questions 1. LOCATION: "Which toe?"      R great toe 2. APPEARANCE: "What does it look like?"      Red and draining pus yesterday- today more clear drainage 3. ONSET: "When did it start?"      2 days ago 4. PAIN: "Is there any pain?" If Yes, ask: "How bad is the pain?"   (Scale 1-10; or mild, moderate, severe)     No- patient has nerve damage- her legs are more painful 5. REDNESS: "Is there any redness of the skin?" If Yes, ask: "How much of the toe is red?"     Yes- around the nailbed 6. OTHER SYMPTOMS: "Do you have any other symptoms?" (e.g., fever, shaking, chills, red streak up foot)     Legs are sore 7. PREGNANCY: "Is there any chance you are pregnant?" "When was your last menstrual period?"     No- LMP- 2 weeks ago  Protocols used: Toenail - Ingrown-A-AH

## 2021-02-13 NOTE — Telephone Encounter (Signed)
Noted  

## 2021-02-14 ENCOUNTER — Ambulatory Visit
Admission: EM | Admit: 2021-02-14 | Discharge: 2021-02-14 | Disposition: A | Discharge status: Home / Self Care | Payer: Medicaid Other | Attending: Emergency Medicine | Admitting: Emergency Medicine

## 2021-02-14 ENCOUNTER — Other Ambulatory Visit: Payer: Self-pay

## 2021-02-14 DIAGNOSIS — L03031 Cellulitis of right toe: Secondary | ICD-10-CM

## 2021-02-14 DIAGNOSIS — R079 Chest pain, unspecified: Secondary | ICD-10-CM

## 2021-02-14 MED ORDER — SULFAMETHOXAZOLE-TRIMETHOPRIM 800-160 MG PO TABS: 1.0000 | ORAL_TABLET | Freq: Two times a day (BID) | ORAL | 0 refills | Status: AC
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
## 2021-02-14 NOTE — ED Triage Notes (Signed)
Patient presents to Urgent Care with complaints of possible toe infection. She states she had developed an ingrown toe nail that resulted into a right toe infection. Cleansing big toe with peroxide and alcohol with no improvement. She also c/o of intermittent chest pain that started a week ago. Pt reports dealing with stress with loss of her mother. She has a hx of anxiety and out of lexapro. She states she is unable to get a refill until the 17th.  Denies fever or SOB.

## 2021-02-14 NOTE — Discharge Instructions (Addendum)
Go to the emergency department if you have chest pain or other concerning symptoms.  Schedule an appointment with your primary care provider.    Take the antibiotic as directed for your toe infection.  Follow-up with a podiatrist in 1 week.  Follow-up sooner if your symptoms are not improving or get worse.

## 2021-02-26 DIAGNOSIS — M79674 Pain in right toe(s): Secondary | ICD-10-CM | POA: Diagnosis not present

## 2021-02-26 DIAGNOSIS — L600 Ingrowing nail: Secondary | ICD-10-CM | POA: Diagnosis not present

## 2021-03-01 DIAGNOSIS — Z419 Encounter for procedure for purposes other than remedying health state, unspecified: Secondary | ICD-10-CM | POA: Diagnosis not present

## 2021-03-12 DIAGNOSIS — M79674 Pain in right toe(s): Secondary | ICD-10-CM | POA: Diagnosis not present

## 2021-03-12 DIAGNOSIS — L600 Ingrowing nail: Secondary | ICD-10-CM | POA: Diagnosis not present

## 2021-03-21 DIAGNOSIS — S34103S Unspecified injury to L3 level of lumbar spinal cord, sequela: Secondary | ICD-10-CM | POA: Diagnosis not present

## 2021-03-21 DIAGNOSIS — S34123D Incomplete lesion of L3 level of lumbar spinal cord, subsequent encounter: Secondary | ICD-10-CM | POA: Diagnosis not present

## 2021-03-31 DIAGNOSIS — Z419 Encounter for procedure for purposes other than remedying health state, unspecified: Secondary | ICD-10-CM | POA: Diagnosis not present

## 2021-04-09 ENCOUNTER — Other Ambulatory Visit: Payer: Self-pay | Admitting: Family Medicine

## 2021-04-09 DIAGNOSIS — F32A Depression, unspecified: Secondary | ICD-10-CM

## 2021-04-09 DIAGNOSIS — S34109S Unspecified injury to unspecified level of lumbar spinal cord, sequela: Secondary | ICD-10-CM

## 2021-04-09 DIAGNOSIS — F419 Anxiety disorder, unspecified: Secondary | ICD-10-CM

## 2021-04-09 DIAGNOSIS — F339 Major depressive disorder, recurrent, unspecified: Secondary | ICD-10-CM

## 2021-04-09 DIAGNOSIS — G8220 Paraplegia, unspecified: Secondary | ICD-10-CM

## 2021-04-09 DIAGNOSIS — M792 Neuralgia and neuritis, unspecified: Secondary | ICD-10-CM

## 2021-04-09 DIAGNOSIS — M62838 Other muscle spasm: Secondary | ICD-10-CM

## 2021-04-09 DIAGNOSIS — Z1159 Encounter for screening for other viral diseases: Secondary | ICD-10-CM

## 2021-04-09 DIAGNOSIS — E559 Vitamin D deficiency, unspecified: Secondary | ICD-10-CM

## 2021-04-09 DIAGNOSIS — G839 Paralytic syndrome, unspecified: Secondary | ICD-10-CM

## 2021-04-09 DIAGNOSIS — Z23 Encounter for immunization: Secondary | ICD-10-CM

## 2021-04-09 NOTE — Telephone Encounter (Signed)
Requested medications are due for refill today.  yes  Requested medications are on the active medications list.  yes  Last refill. 02/06/2021  Future visit scheduled.   no  Notes to clinic.  PCP is listed as Flinchum. Pt is due for OV.

## 2021-04-11 NOTE — Telephone Encounter (Signed)
Courtesy refill given on 02/06/2021. Per note of that refill:  Patient needs office visit prior to further refills.

## 2021-04-17 ENCOUNTER — Other Ambulatory Visit: Payer: Self-pay | Admitting: Family Medicine

## 2021-04-17 DIAGNOSIS — F32A Depression, unspecified: Secondary | ICD-10-CM

## 2021-04-17 DIAGNOSIS — Z1159 Encounter for screening for other viral diseases: Secondary | ICD-10-CM

## 2021-04-17 DIAGNOSIS — M62838 Other muscle spasm: Secondary | ICD-10-CM

## 2021-04-17 DIAGNOSIS — E559 Vitamin D deficiency, unspecified: Secondary | ICD-10-CM

## 2021-04-17 DIAGNOSIS — Z23 Encounter for immunization: Secondary | ICD-10-CM

## 2021-04-17 DIAGNOSIS — S34109S Unspecified injury to unspecified level of lumbar spinal cord, sequela: Secondary | ICD-10-CM

## 2021-04-17 DIAGNOSIS — G8220 Paraplegia, unspecified: Secondary | ICD-10-CM

## 2021-04-17 DIAGNOSIS — G839 Paralytic syndrome, unspecified: Secondary | ICD-10-CM

## 2021-04-17 DIAGNOSIS — F419 Anxiety disorder, unspecified: Secondary | ICD-10-CM

## 2021-04-17 DIAGNOSIS — M792 Neuralgia and neuritis, unspecified: Secondary | ICD-10-CM

## 2021-04-17 DIAGNOSIS — F339 Major depressive disorder, recurrent, unspecified: Secondary | ICD-10-CM

## 2021-04-18 NOTE — Telephone Encounter (Signed)
Requested medication (s) are due for refill today:   Yes  Requested medication (s) are on the active medication list:   Yes  Future visit scheduled:   Yes with Merita Norton, NP  for 05/11/2021 at 1:00   Last ordered: She is requesting a refill until her above appt. Reason returned.   (I called her and made the appt).   Requested Prescriptions  Pending Prescriptions Disp Refills   escitalopram (LEXAPRO) 5 MG tablet [Pharmacy Med Name: Escitalopram Oxalate 5 MG Oral Tablet] 180 tablet 0    Sig: Take 3 tablets by mouth once daily     Psychiatry:  Antidepressants - SSRI Failed - 04/17/2021  1:22 PM      Failed - Valid encounter within last 6 months    Recent Outpatient Visits           7 months ago Paraplegia [facility])   [facility] Flinchum, Eula Fried, FNP       Future Appointments             In 3 weeks Jacky Kindle, FNP [facility]            Passed - Completed PHQ-2 or PHQ-9 in the last 360 days

## 2021-04-27 ENCOUNTER — Other Ambulatory Visit: Payer: Self-pay

## 2021-04-27 ENCOUNTER — Ambulatory Visit (INDEPENDENT_AMBULATORY_CARE_PROVIDER_SITE_OTHER): Payer: Medicaid Other | Admitting: Family Medicine

## 2021-04-27 ENCOUNTER — Encounter: Payer: Self-pay | Admitting: Family Medicine

## 2021-04-27 VITALS — BP 116/65 | HR 95 | Temp 98.0°F | Resp 16

## 2021-04-27 DIAGNOSIS — Z23 Encounter for immunization: Secondary | ICD-10-CM | POA: Diagnosis not present

## 2021-04-27 DIAGNOSIS — F419 Anxiety disorder, unspecified: Secondary | ICD-10-CM | POA: Diagnosis not present

## 2021-04-27 DIAGNOSIS — F339 Major depressive disorder, recurrent, unspecified: Secondary | ICD-10-CM

## 2021-04-27 DIAGNOSIS — M62838 Other muscle spasm: Secondary | ICD-10-CM

## 2021-04-27 DIAGNOSIS — M792 Neuralgia and neuritis, unspecified: Secondary | ICD-10-CM | POA: Diagnosis not present

## 2021-04-27 DIAGNOSIS — G8220 Paraplegia, unspecified: Secondary | ICD-10-CM

## 2021-04-27 DIAGNOSIS — G4700 Insomnia, unspecified: Secondary | ICD-10-CM | POA: Diagnosis not present

## 2021-04-27 DIAGNOSIS — S34109S Unspecified injury to unspecified level of lumbar spinal cord, sequela: Secondary | ICD-10-CM | POA: Diagnosis not present

## 2021-04-27 DIAGNOSIS — G839 Paralytic syndrome, unspecified: Secondary | ICD-10-CM | POA: Diagnosis not present

## 2021-04-27 DIAGNOSIS — F32A Depression, unspecified: Secondary | ICD-10-CM | POA: Diagnosis not present

## 2021-04-27 MED ORDER — TRAZODONE HCL 50 MG PO TABS: 25.0000 mg | ORAL_TABLET | Freq: Every evening | ORAL | 3 refills | Status: DC | PRN

## 2021-04-27 MED ORDER — ESCITALOPRAM OXALATE 20 MG PO TABS: 20.0000 mg | ORAL_TABLET | Freq: Every day | ORAL | 3 refills | Status: DC

## 2021-04-27 MED ORDER — AMITRIPTYLINE HCL 50 MG PO TABS: 50.0000 mg | ORAL_TABLET | Freq: Every day | ORAL | 1 refills | Status: DC

## 2021-04-27 NOTE — Assessment & Plan Note (Signed)
Consent reviewed; vaccine provided

## 2021-04-27 NOTE — Assessment & Plan Note (Signed)
Chronic, stable 

## 2021-04-27 NOTE — Assessment & Plan Note (Signed)
Denies panic attacks; recommend journal use and request to try CBT again knowing that the first therapist found may not be the best fit

## 2021-04-27 NOTE — Assessment & Plan Note (Signed)
Trial of trazodone; suggestion for CBT or similar- pt refusal at this time
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
## 2021-04-27 NOTE — Assessment & Plan Note (Signed)
Some nerve pain noticed at night; remain on elevail

## 2021-04-27 NOTE — Assessment & Plan Note (Signed)
Elavil on MAR qHS

## 2021-04-27 NOTE — Assessment & Plan Note (Signed)
Chronic, worsening given recent loss of medication

## 2021-04-27 NOTE — Assessment & Plan Note (Signed)
W/c bound; independent in movement

## 2021-04-27 NOTE — Assessment & Plan Note (Signed)
BLE s/p injury; chronic, stable

## 2021-04-27 NOTE — Assessment & Plan Note (Signed)
Out of medication >7 days; recommend restart- requesting increase in dose Increase dose to 20 mg, previously taking 3-5mg  tablets RTC to discuss improvements, continue to seek alternatives for mood adjustment

## 2021-05-01 DIAGNOSIS — Z419 Encounter for procedure for purposes other than remedying health state, unspecified: Secondary | ICD-10-CM | POA: Diagnosis not present

## 2021-05-07 DIAGNOSIS — S34103S Unspecified injury to L3 level of lumbar spinal cord, sequela: Secondary | ICD-10-CM | POA: Diagnosis not present

## 2021-05-07 DIAGNOSIS — S34123D Incomplete lesion of L3 level of lumbar spinal cord, subsequent encounter: Secondary | ICD-10-CM | POA: Diagnosis not present

## 2021-05-11 ENCOUNTER — Ambulatory Visit: Payer: Medicaid Other | Admitting: Family Medicine

## 2021-05-31 DIAGNOSIS — Z419 Encounter for procedure for purposes other than remedying health state, unspecified: Secondary | ICD-10-CM | POA: Diagnosis not present

## 2021-06-04 ENCOUNTER — Ambulatory Visit: Payer: Self-pay

## 2021-06-04 NOTE — Telephone Encounter (Signed)
Pt c/o moderate intermittent ear pain affecting left ear. Sx started 05/28/21. Pt has nasal congestion, runny nose, feels achy.  No fever. No drainage.  Care advice given and pt verbalized understanding. Appt given tomorrow. PCP did not have any appt's available until  into the new year.            Reason for Disposition  Earache  (Exceptions: brief ear pain of < 60 minutes duration, earache occurring during air travel  Answer Assessment - Initial Assessment Questions 1. LOCATION: "Which ear is involved?"     Left ear 2. ONSET: "When did the ear start hurting"      left 3. SEVERITY: "How bad is the pain?"  (Scale 1-10; mild, moderate or severe)   - MILD (1-3): doesn't interfere with normal activities    - MODERATE (4-7): interferes with normal activities or awakens from sleep    - SEVERE (8-10): excruciating pain, unable to do any normal activities      Moderate intermittent 4. URI SYMPTOMS: "Do you have a runny nose or cough?"     Runny nose and cough 5. FEVER: "Do you have a fever?" If Yes, ask: "What is your temperature, how was it measured, and when did it start?"     no 6. CAUSE: "Have you been swimming recently?", "How often do you use Q-TIPS?", "Have you had any recent air travel or scuba diving?"     Qtips 7. OTHER SYMPTOMS: "Do you have any other symptoms?" (e.g., headache, stiff neck, dizziness, vomiting, runny nose, decreased hearing)     Runny nose, achy, muffled hearing 8. PREGNANCY: "Is there any chance you are pregnant?" "When was your last menstrual period?"     No- LMP 2.5 weeks ago  Protocols used: Davina Poke

## 2021-06-06 ENCOUNTER — Other Ambulatory Visit: Payer: Self-pay

## 2021-06-06 ENCOUNTER — Encounter: Payer: Self-pay | Admitting: Family Medicine

## 2021-06-06 ENCOUNTER — Ambulatory Visit (INDEPENDENT_AMBULATORY_CARE_PROVIDER_SITE_OTHER): Payer: Medicaid Other | Admitting: Family Medicine

## 2021-06-06 VITALS — BP 113/75 | HR 93 | Temp 98.8°F | Resp 16 | Wt 229.3 lb

## 2021-06-06 DIAGNOSIS — J069 Acute upper respiratory infection, unspecified: Secondary | ICD-10-CM | POA: Diagnosis not present

## 2021-06-06 DIAGNOSIS — E7800 Pure hypercholesterolemia, unspecified: Secondary | ICD-10-CM | POA: Diagnosis not present

## 2021-06-06 DIAGNOSIS — E559 Vitamin D deficiency, unspecified: Secondary | ICD-10-CM | POA: Diagnosis not present

## 2021-06-06 MED ORDER — DM-PHENYLEPHRINE-ACETAMINOPHEN 10-5-325 MG PO CAPS: 2.0000 | ORAL_CAPSULE | ORAL | 0 refills | Status: DC | PRN

## 2021-06-06 MED ORDER — PSEUDOEPHEDRINE-APAP-DM 30-325-15 MG PO CAPS: 1.0000 | ORAL_CAPSULE | ORAL | 0 refills | Status: DC | PRN

## 2021-06-06 NOTE — Progress Notes (Signed)
   SUBJECTIVE:   CHIEF COMPLAINT / HPI:   EAR PAIN - has not tested for COVID - +sick contact.  Duration:  1 week Involved ear(s): left Fever: no Tmax 100.1 Otorrhea: no Upper respiratory infection symptoms: yes, cough productive of phlegm, runny nose, congestion Pruritus: no Hearing loss: yes Water immersion no Using Q-tips: yes Recurrent otitis media: no Treatments attempted:  cold/sinus medication, helped but ran out.   OBJECTIVE:   BP 113/75 (BP Location: Left Arm, Patient Position: Sitting, Cuff Size: Large)   Pulse 93   Temp 98.8 F (37.1 C) (Oral)   Resp 16   Wt 229 lb 4.8 oz (104 kg)   BMI 35.91 kg/m   Gen: well appearing, in NAD HEENT: oropharynx erythematous without exudate or palatal petchiae. Tms visible bilaterally without bulging or purulence.  Card: RRR Lungs: CTAB   ASSESSMENT/PLAN:   VIRAL URI Doing well with mild sx. Will test for COVID, not a candidate for COVID treatment given duration of symptoms. Reviewed OTC symptom relief, self-quarantine guidelines, and emergency precautions.     Caro Laroche, DO

## 2021-06-06 NOTE — Patient Instructions (Signed)
You have a cold and it should start to get better about 7 - 10 days after it started.    Take the dayquil as needed  For your nasal congestion and runny nose, try using Afrin (generic is Oxymetazoline) twice daily for 3 days.  Do not use for longer that 3 days.    Some other therapies you can try are: push fluids, rest, gargle warm salt water, use vaporizer or mist as needed, and return office visit as needed if symptoms persist or worsen.   Drinking warm liquids such as teas and soups can help with secretions and cough. A mist humidifier or vaporizer can work well to help with secretions and cough.  It is very important to clean the humidifier between use according to the instructions.    It was good to see you.  If you're still having trouble in the next week, come back and see [place].    Of course, if you start having trouble breathing, worsening fevers, vomiting and unable to hold down any fluids, or you have other concerns, don't hesitate to come back or go to the ED after hours.

## 2021-06-07 LAB — NOVEL CORONAVIRUS, NAA: SARS-CoV-2, NAA: NOT DETECTED

## 2021-06-07 LAB — LIPID PANEL W/O CHOL/HDL RATIO
Cholesterol, Total: 201 mg/dL — ABNORMAL HIGH (ref 100–199)
HDL: 36 mg/dL — ABNORMAL LOW (ref >39)
LDL Chol Calc (NIH): 134 mg/dL — ABNORMAL HIGH (ref 0–99)
Triglycerides: 172 mg/dL — ABNORMAL HIGH (ref 0–149)
VLDL Cholesterol Cal: 31 mg/dL (ref 5–40)

## 2021-06-07 LAB — VITAMIN D 25 HYDROXY (VIT D DEFICIENCY, FRACTURES): Vit D, 25-Hydroxy: 40.7 ng/mL (ref 30.0–100.0)

## 2021-06-07 LAB — SPECIMEN STATUS REPORT

## 2021-06-07 NOTE — Progress Notes (Signed)
Patient is now seeing you it appears, please let me know you received for review.

## 2021-06-10 NOTE — Progress Notes (Signed)
See labs for review, Pcp patient seeing you now. Thanks

## 2021-06-20 DIAGNOSIS — S34103S Unspecified injury to L3 level of lumbar spinal cord, sequela: Secondary | ICD-10-CM | POA: Diagnosis not present

## 2021-06-20 DIAGNOSIS — S34123D Incomplete lesion of L3 level of lumbar spinal cord, subsequent encounter: Secondary | ICD-10-CM | POA: Diagnosis not present

## 2021-07-01 DIAGNOSIS — Z419 Encounter for procedure for purposes other than remedying health state, unspecified: Secondary | ICD-10-CM | POA: Diagnosis not present

## 2021-07-07 ENCOUNTER — Other Ambulatory Visit: Payer: Self-pay | Admitting: Family Medicine

## 2021-07-07 DIAGNOSIS — Z23 Encounter for immunization: Secondary | ICD-10-CM

## 2021-07-07 DIAGNOSIS — F339 Major depressive disorder, recurrent, unspecified: Secondary | ICD-10-CM

## 2021-07-07 DIAGNOSIS — E559 Vitamin D deficiency, unspecified: Secondary | ICD-10-CM

## 2021-07-07 DIAGNOSIS — G839 Paralytic syndrome, unspecified: Secondary | ICD-10-CM

## 2021-07-07 DIAGNOSIS — Z1159 Encounter for screening for other viral diseases: Secondary | ICD-10-CM

## 2021-07-07 DIAGNOSIS — M62838 Other muscle spasm: Secondary | ICD-10-CM

## 2021-07-07 DIAGNOSIS — M792 Neuralgia and neuritis, unspecified: Secondary | ICD-10-CM

## 2021-07-07 DIAGNOSIS — G8220 Paraplegia, unspecified: Secondary | ICD-10-CM

## 2021-07-07 DIAGNOSIS — F419 Anxiety disorder, unspecified: Secondary | ICD-10-CM

## 2021-07-07 DIAGNOSIS — S34109S Unspecified injury to unspecified level of lumbar spinal cord, sequela: Secondary | ICD-10-CM

## 2021-07-07 NOTE — Telephone Encounter (Signed)
Requested Prescriptions  Pending Prescriptions Disp Refills   gabapentin (NEURONTIN) 400 MG capsule [Pharmacy Med Name: Gabapentin 400 MG Oral Capsule] 720 capsule 0    Sig: TAKE 2 CAPSULES BY MOUTH 4 TIMES DAILY     Neurology: Anticonvulsants - gabapentin Passed - 07/07/2021 10:09 AM      Passed - Valid encounter within last 12 months    Recent Outpatient Visits          1 month ago Viral URI   [facility] [place], Darl Householder, DO   2 months ago Depression, recurrent [facility])   [facility] Jacky Kindle, FNP   9 months ago Paraplegia [facility])   [facility], Eula Fried, FNP

## 2021-08-01 DIAGNOSIS — Z419 Encounter for procedure for purposes other than remedying health state, unspecified: Secondary | ICD-10-CM | POA: Diagnosis not present

## 2021-08-29 DIAGNOSIS — Z419 Encounter for procedure for purposes other than remedying health state, unspecified: Secondary | ICD-10-CM | POA: Diagnosis not present

## 2021-09-07 ENCOUNTER — Ambulatory Visit: Payer: Self-pay
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
## 2021-09-07 NOTE — Telephone Encounter (Signed)
Summary: rash on her stomach for about a week.  ? Pt stated she has had a rash on her stomach for about a week. No pain, no swelling, only itching  ? ?Pt scheduled an appointment for September 10, 2021.  ? ?Seeking clinical advice.   ?  ? ?Chief Complaint: Rash to abdomen ?Symptoms: Small red bumps. Size of pen tip. Itchy ?Frequency: 1 week ago ?Pertinent Negatives: Patient denies Pain ?Disposition: [] ED /[] Urgent Care (no appt availability in office) / [] Appointment(In office/virtual)/ []  [place] [facility]/ [] Home Care/ [] Refused Recommended Disposition /[] [place] [facility]/ []  Follow-up with PCP ?Additional Notes: Has appt. 09/10/21. Will try hydrocortisone cream for itching.  ?Answer Assessment - Initial Assessment Questions ?1. APPEARANCE of RASH: "Describe the rash."  ?    Tiny red bumps ?2. LOCATION: "Where is the rash located?"  ?    Abdomen ?3. NUMBER: "How many spots are there?"  ?    Many ?4. SIZE: "How big are the spots?" (Inches, centimeters or compare to size of a coin)  ?    Pen tip ?5. ONSET: "When did the rash start?"  ?    1 week ago ?6. ITCHING: "Does the rash itch?" If Yes, ask: "How bad is the itch?"  (Scale 0-10; or none, mild, moderate, severe) ?    Yes - 6 ?7. PAIN: "Does the rash hurt?" If Yes, ask: "How bad is the pain?"  (Scale 0-10; or none, mild, moderate, severe) ?   - NONE (0): no pain ?   - MILD (1-3): doesn't interfere with normal activities  ?   - MODERATE (4-7): interferes with normal activities or awakens from sleep  ?   - SEVERE (8-10): excruciating pain, unable to do any normal activities ?    None ?8. OTHER SYMPTOMS: "Do you have any other symptoms?" (e.g., fever) ?    No ?9. PREGNANCY: "Is there any chance you are pregnant?" "When was your last menstrual period?" ?    No ? ?Protocols used: Rash or Redness - Localized-A-AH ? ?

## 2021-09-10 ENCOUNTER — Other Ambulatory Visit: Payer: Self-pay

## 2021-09-10 ENCOUNTER — Ambulatory Visit (INDEPENDENT_AMBULATORY_CARE_PROVIDER_SITE_OTHER): Payer: Medicaid Other | Admitting: Physician Assistant

## 2021-09-10 ENCOUNTER — Encounter: Payer: Self-pay | Admitting: Physician Assistant

## 2021-09-10 VITALS — BP 124/71 | HR 89 | Resp 14 | Wt 239.7 lb

## 2021-09-10 DIAGNOSIS — R21 Rash and other nonspecific skin eruption: Secondary | ICD-10-CM | POA: Diagnosis not present

## 2021-09-13 ENCOUNTER — Encounter: Payer: Self-pay | Admitting: Physician Assistant

## 2021-09-16 NOTE — Progress Notes (Deleted)
?  ? ? ?  Established patient visit ? ? ?Patient: Angela [name]   DOB: 2000-07-28   21 y.o. Female  MRN: [id number] ?Visit Date: 09/17/2021 ? ?Today's healthcare provider: Debera Lat, PA-C  ? ?No chief complaint on file. ? ?Subjective  ?  ?HPI  ?*** ? ?1. Rash ?Possible fungal etiology. Hx of multiple pets at home including foster ones. ?OTC fungal cream and benadry at night.  ?Will reassess in 7 days. ? ?Medications: ?Outpatient Medications Prior to Visit  ?Medication Sig  ? amitriptyline (ELAVIL) 50 MG tablet Take 1 tablet (50 mg total) by mouth at bedtime.  ? baclofen (LIORESAL) 10 MG tablet Take by mouth.  ? DM-Phenylephrine-Acetaminophen 10-5-325 MG CAPS Take 2 capsules by mouth every 4 (four) hours as needed.  ? escitalopram (LEXAPRO) 20 MG tablet Take 1 tablet (20 mg total) by mouth daily.  ? gabapentin (NEURONTIN) 400 MG capsule TAKE 2 CAPSULES BY MOUTH 4 TIMES DAILY  ? ibuprofen (ADVIL) 800 MG tablet Take 1 tablet (800 mg total) by mouth every 6 (six) hours as needed for moderate pain.  ? levonorgestrel-ethinyl estradiol (AVIANE) 0.1-20 MG-MCG tablet Take 1 tablet by mouth daily.  ? sodium fluoride (FLUORISHIELD) 1.1 % GEL dental gel Take by mouth daily.  ? traZODone (DESYREL) 50 MG tablet Take 0.5-1 tablets (25-50 mg total) by mouth at bedtime as needed for sleep.  ? Vitamin D, Ergocalciferol, (DRISDOL) 1.25 MG (50000 UNIT) CAPS capsule Take 1 capsule (50,000 Units total) by mouth every 7 (seven) days. (taking one tablet per week) walk in lab in office 1-2 weeks after completing prescription.  ? ?No facility-administered medications prior to visit.  ? ? ?Review of Systems ? ?{Labs  Heme  Chem  Endocrine  Serology  Results Review (optional):23779} ?  Objective  ?  ?There were no vitals taken for this visit. ?{Show previous vital signs (optional):23777} ? ?Physical Exam  ?*** ? ?No results found for any visits on 09/17/21. ? Assessment & Plan  ?  ? ?*** ? ?No follow-ups on file.  ?   ?The patient was  advised to call back or seek an in-person evaluation if the symptoms worsen or if the condition fails to improve as anticipated. ? ?I discussed the assessment and treatment plan with the patient. The patient was provided an opportunity to ask questions and all were answered. The patient agreed with the plan and demonstrated an understanding of the instructions. ? ?The entirety of the information documented in the History of Present Illness, Review of Systems and Physical Exam were personally obtained by me. Portions of this information were initially documented by the CMA and reviewed by me for thoroughness and accuracy.   ? ?Debera Lat, PA-C  ?[facility] ?[phone] (phone) ?[phone] (fax) ? ?[facility]  ?

## 2021-09-17 ENCOUNTER — Ambulatory Visit: Payer: Medicaid Other | Admitting: Physician Assistant

## 2021-09-29 ENCOUNTER — Other Ambulatory Visit: Payer: Self-pay | Admitting: Obstetrics and Gynecology

## 2021-09-29 DIAGNOSIS — Z3041 Encounter for surveillance of contraceptive pills: Secondary | ICD-10-CM

## 2021-09-29 DIAGNOSIS — Z419 Encounter for procedure for purposes other than remedying health state, unspecified: Secondary | ICD-10-CM | POA: Diagnosis not present

## 2021-10-12 ENCOUNTER — Other Ambulatory Visit: Payer: Self-pay | Admitting: Obstetrics and Gynecology

## 2021-10-12 DIAGNOSIS — Z3041 Encounter for surveillance of contraceptive pills: Secondary | ICD-10-CM

## 2021-10-15 ENCOUNTER — Other Ambulatory Visit: Payer: Self-pay | Admitting: Obstetrics and Gynecology

## 2021-10-15 DIAGNOSIS — Z3041 Encounter for surveillance of contraceptive pills: Secondary | ICD-10-CM

## 2021-10-16 MED ORDER — LEVONORGESTREL-ETHINYL ESTRAD 0.1-20 MG-MCG PO TABS: 1.0000 | ORAL_TABLET | Freq: Every day | ORAL | 0 refills | Status: DC

## 2021-10-16 NOTE — Addendum Note (Signed)
Addended by: Loran Senters D on: 10/16/2021 10:50 AM ? ? Modules accepted: Orders ? ?

## 2021-10-16 NOTE — Telephone Encounter (Signed)
Pt calling for refill ob bc; has appt schedule for 11/19/21.  [phone]  pt aware refill eRx'd. ?

## 2021-10-29 DIAGNOSIS — Z419 Encounter for procedure for purposes other than remedying health state, unspecified: Secondary | ICD-10-CM | POA: Diagnosis not present

## 2021-11-02 ENCOUNTER — Other Ambulatory Visit: Payer: Self-pay | Admitting: Family Medicine

## 2021-11-02 DIAGNOSIS — M792 Neuralgia and neuritis, unspecified: Secondary | ICD-10-CM

## 2021-11-02 DIAGNOSIS — Z1159 Encounter for screening for other viral diseases: Secondary | ICD-10-CM

## 2021-11-02 DIAGNOSIS — S34109S Unspecified injury to unspecified level of lumbar spinal cord, sequela: Secondary | ICD-10-CM

## 2021-11-02 DIAGNOSIS — G839 Paralytic syndrome, unspecified: Secondary | ICD-10-CM

## 2021-11-02 DIAGNOSIS — F339 Major depressive disorder, recurrent, unspecified: Secondary | ICD-10-CM

## 2021-11-02 DIAGNOSIS — F419 Anxiety disorder, unspecified: Secondary | ICD-10-CM

## 2021-11-02 DIAGNOSIS — M62838 Other muscle spasm: Secondary | ICD-10-CM

## 2021-11-02 DIAGNOSIS — E559 Vitamin D deficiency, unspecified: Secondary | ICD-10-CM

## 2021-11-02 DIAGNOSIS — F32A Anxiety disorder, unspecified: Secondary | ICD-10-CM

## 2021-11-02 DIAGNOSIS — G8220 Paraplegia, unspecified: Secondary | ICD-10-CM

## 2021-11-02 DIAGNOSIS — Z23 Encounter for immunization: Secondary | ICD-10-CM

## 2021-11-02 NOTE — Telephone Encounter (Signed)
Requested medications are due for refill today.  yes ? ?Requested medications are on the active medications list.  yes ? ?Last refill. 07/07/2021 #720 0 refills ? ?Future visit scheduled.   no ? ?Notes to clinic.  Medication refill failed protocol d/t expired labs. ? ? ? ?Requested Prescriptions  ?Pending Prescriptions Disp Refills  ? gabapentin (NEURONTIN) 400 MG capsule [Pharmacy Med Name: Gabapentin 400 MG Oral Capsule] 720 capsule 0  ?  Sig: TAKE 2 CAPSULES BY MOUTH 4 TIMES DAILY  ?  ? Neurology: Anticonvulsants - gabapentin Failed - 11/02/2021  2:53 PM  ?  ?  Failed - Cr in normal range and within 360 days  ?  Creat  ?Date Value Ref Range Status  ?02/17/2018 0.56 0.50 - 1.00 mg/dL Final  ? ?Creatinine, Ser  ?Date Value Ref Range Status  ?09/13/2020 0.60 0.57 - 1.00 mg/dL Final  ?  ?  ?  ?  Passed - Completed PHQ-2 or PHQ-9 in the last 360 days  ?  ?  Passed - Valid encounter within last 12 months  ?  Recent Outpatient Visits   ? ?      ? 1 month ago Rash  ? [facility] [place], [place]  ? 4 months ago Viral URI  ? [place], DO  ? 6 months ago Depression, recurrent ([place])  ? [facility] Tally Joe T, FNP  ? 1 year ago Paraplegia [facility])  ? [place], FNP  ? ?  ?  ? ? ?  ?  ?  ?  ?

## 2021-11-05 NOTE — Telephone Encounter (Signed)
Pt states she only has enough gabapentin for her 2 pm dose and then she will be out. ?Pt states she really needs the medication, w/out she could have a seizure, since she is on such a high dose. ? ?Pt takes an evening and night dose. ? ?Robynn Pane is out of office today, can someone else approve the refill? ?  ?

## 2021-11-06 NOTE — Telephone Encounter (Addendum)
Patient states she ran out of gabapentin (NEURONTIN) 400 MG capsule yesterday. Patient states since she is on a high dose being without medication can cause her to have a seizure. Patient would like PCP to refill medication today. ? ? ?[facility] [address], [place] - [id number] [place] [address] Phone:  [phone]  ?Fax:  [phone]  ?  ? ? ? ? ? ?

## 2021-11-07 ENCOUNTER — Other Ambulatory Visit: Payer: Self-pay | Admitting: Family Medicine

## 2021-11-07 DIAGNOSIS — M62838 Other muscle spasm: Secondary | ICD-10-CM

## 2021-11-07 DIAGNOSIS — F419 Anxiety disorder, unspecified: Secondary | ICD-10-CM

## 2021-11-07 DIAGNOSIS — G839 Paralytic syndrome, unspecified: Secondary | ICD-10-CM

## 2021-11-07 DIAGNOSIS — F32A Depression, unspecified: Secondary | ICD-10-CM

## 2021-11-07 DIAGNOSIS — S34109S Unspecified injury to unspecified level of lumbar spinal cord, sequela: Secondary | ICD-10-CM

## 2021-11-07 DIAGNOSIS — M792 Neuralgia and neuritis, unspecified: Secondary | ICD-10-CM

## 2021-11-07 DIAGNOSIS — G8220 Paraplegia, unspecified: Secondary | ICD-10-CM

## 2021-11-07 NOTE — Telephone Encounter (Signed)
LMTCB to schedule appointment °
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
## 2021-11-19 ENCOUNTER — Ambulatory Visit: Payer: Medicaid Other | Admitting: Obstetrics and Gynecology

## 2021-11-29 DIAGNOSIS — Z419 Encounter for procedure for purposes other than remedying health state, unspecified: Secondary | ICD-10-CM | POA: Diagnosis not present

## 2021-12-07 DIAGNOSIS — S34103S Unspecified injury to L3 level of lumbar spinal cord, sequela: Secondary | ICD-10-CM | POA: Diagnosis not present

## 2021-12-07 DIAGNOSIS — S34123D Incomplete lesion of L3 level of lumbar spinal cord, subsequent encounter: Secondary | ICD-10-CM | POA: Diagnosis not present

## 2021-12-10 NOTE — Progress Notes (Signed)
Established patient visit   Patient: Angela [name]   DOB: Apr 05, 2001   21 y.o. Female  MRN: [id number] Visit Date: 12/11/2021  Today's healthcare provider: [name], FNP  Introduced to nurse practitioner role and practice setting.  All questions answered.  Discussed provider/patient relationship and expectations.   I,Tiffany J Bragg,acting as a scribe for [name], FNP.,have documented all relevant documentation on the behalf of [name], FNP,as directed by  [name], FNP while in the presence of [name], FNP.   Chief Complaint  Patient presents with   Anxiety   Subjective    HPI  Anxiety, Follow-up  She was last seen for anxiety 8 months ago. Changes made at last visit include continue medication.   She reports excellent compliance with treatment. She reports excellent tolerance of treatment. She is having side effects. Memory loss and vision problems, not sure if it is from gabapentin  She feels her anxiety is mild and Unchanged since last visit.  Symptoms: No chest pain No difficulty concentrating  No dizziness No fatigue  No feelings of losing control No insomnia  No irritable No palpitations  No panic attacks No racing thoughts  No shortness of breath No sweating  No tremors/shakes    GAD-7 Results     No data to display          PHQ-9 Scores    12/11/2021    3:26 PM 09/10/2021    1:22 PM 06/06/2021   11:11 AM  PHQ9 SCORE ONLY  PHQ-9 Total Score _0 ---------------------------------------------------------------------------------------------------  Depression, Follow-up  She  was last seen for this 8 months ago. Changes made at last visit include continue medication.   She reports excellent compliance with treatment. She is not having side effects.   She reports excellent tolerance of treatment.  She feels she is Unchanged since last visit.     12/11/2021    3:26 PM 09/10/2021    1:22 PM 06/06/2021    11:11 AM  Depression screen PHQ 2/9  Decreased Interest 1 2 0  Down, Depressed, Hopeless _1 PHQ - 2 Score _2 Altered sleeping _3 Tired, decreased energy _4 Change in appetite _5 Feeling bad or failure about yourself  0 0 1  Trouble concentrating 0 0 0  Moving slowly or fidgety/restless 0 0 0  Suicidal thoughts 0 0 0  PHQ-9 Score _6 Difficult doing work/chores Very difficult  Somewhat difficult    -----------------------------------------------------------------------------------------   Medications: Outpatient Medications Prior to Visit  Medication Sig   amitriptyline (ELAVIL) 50 MG tablet TAKE 1 TABLET BY MOUTH AT BEDTIME   DM-Phenylephrine-Acetaminophen 10-5-325 MG CAPS Take 2 capsules by mouth every 4 (four) hours as needed.   escitalopram (LEXAPRO) 20 MG tablet Take 1 tablet (20 mg total) by mouth daily.   gabapentin (NEURONTIN) 400 MG capsule TAKE 2 CAPSULES BY MOUTH 4 TIMES DAILY   ibuprofen (ADVIL) 800 MG tablet Take 1 tablet (800 mg total) by mouth every 6 (six) hours as needed for moderate pain.   levonorgestrel-ethinyl estradiol (AVIANE) 0.1-20 MG-MCG tablet Take 1 tablet by mouth daily.   sodium fluoride (FLUORISHIELD) 1.1 % GEL dental gel Take by mouth daily.   [DISCONTINUED] baclofen (LIORESAL) 10 MG tablet Take by mouth.   Vitamin D, Ergocalciferol, (DRISDOL) 1.25 MG (50000 UNIT) CAPS capsule Take  1 capsule (50,000 Units total) by mouth every 7 (seven) days. (taking one tablet per week) walk in lab in office 1-2 weeks after completing prescription. (Patient not taking: Reported on 12/11/2021)   [DISCONTINUED] traZODone (DESYREL) 50 MG tablet Take 0.5-1 tablets (25-50 mg total) by mouth at bedtime as needed for sleep.   No facility-administered medications prior to visit.    Review of Systems  Last CBC Lab Results  Component Value Date   WBC 6.7 09/13/2020   HGB 13.4 09/13/2020   HCT 41.2 09/13/2020   MCV 89 09/13/2020   MCH 28.9  09/13/2020   RDW 12.0 09/13/2020   PLT 316 29/93/7169   Last metabolic panel Lab Results  Component Value Date   GLUCOSE 85 09/13/2020   NA 138 09/13/2020   K 4.2 09/13/2020   CL 101 09/13/2020   CO2 21 09/13/2020   BUN 9 09/13/2020   CREATININE 0.60 09/13/2020   EGFR 131 09/13/2020   CALCIUM 9.1 09/13/2020   PHOS 4.0 06/30/2018   PROT 6.5 09/13/2020   ALBUMIN 4.1 09/13/2020   LABGLOB 2.4 09/13/2020   AGRATIO 1.7 09/13/2020   BILITOT <0.2 09/13/2020   ALKPHOS 87 09/13/2020   AST 19 09/13/2020   ALT 15 09/13/2020   ANIONGAP 10 02/04/2020   Last lipids Lab Results  Component Value Date   CHOL 201 (H) 06/06/2021   HDL 36 (L) 06/06/2021   LDLCALC 134 (H) 06/06/2021   TRIG 172 (H) 06/06/2021   CHOLHDL 6.0 (H) 09/13/2020   Last hemoglobin A1c Lab Results  Component Value Date   HGBA1C 4.7 (L) 06/30/2018   Last thyroid functions Lab Results  Component Value Date   TSH 1.110 09/13/2020   Last vitamin D Lab Results  Component Value Date   VD25OH 40.7 06/06/2021   Last vitamin B12 and Folate No results found for: "VITAMINB12", "FOLATE"     Objective    BP 119/83 (BP Location: Left Arm, Patient Position: Sitting, Cuff Size: Normal)   Pulse 84   Temp 98.3 F (36.8 C) (Oral)   Resp 16   Wt 250 lb (113.4 kg)   SpO2 98%   BMI 39.16 kg/m  BP Readings from Last 3 Encounters:  12/11/21 119/83  09/10/21 124/71  06/06/21 113/75   Wt Readings from Last 3 Encounters:  12/11/21 250 lb (113.4 kg)  09/10/21 239 lb 11.2 oz (108.7 kg)  06/06/21 229 lb 4.8 oz (104 kg)   SpO2 Readings from Last 3 Encounters:  12/11/21 98%  09/10/21 99%  04/27/21 99%      Physical Exam Vitals and nursing note reviewed.  Constitutional:      General: She is not in acute distress.    Appearance: Normal appearance. She is obese. She is not ill-appearing, toxic-appearing or diaphoretic.  HENT:     Head: Normocephalic and atraumatic.  Cardiovascular:     Rate and Rhythm: Normal  rate and regular rhythm.     Pulses: Normal pulses.     Heart sounds: Normal heart sounds. No murmur heard.    No friction rub. No gallop.  Pulmonary:     Effort: Pulmonary effort is normal. No respiratory distress.     Breath sounds: Normal breath sounds. No stridor. No wheezing, rhonchi or rales.  Chest:     Chest wall: No tenderness.  Musculoskeletal:        General: No swelling, tenderness, deformity or signs of injury. Normal range of motion.     Right lower leg: No  edema.     Left lower leg: No edema.     Comments: Hx of paraplegia and w/c use; presents in w/c today  Skin:    General: Skin is warm and dry.     Capillary Refill: Capillary refill takes less than 2 seconds.     Coloration: Skin is not jaundiced or pale.     Findings: No bruising, erythema, lesion or rash.  Neurological:     General: No focal deficit present.     Mental Status: She is alert and oriented to person, place, and time. Mental status is at baseline.     Cranial Nerves: No cranial nerve deficit.     Sensory: No sensory deficit.     Motor: No weakness.     Coordination: Coordination normal.  Psychiatric:        Mood and Affect: Mood normal. Affect is flat.        Behavior: Behavior normal.        Thought Content: Thought content normal.        Judgment: Judgment normal.       No results found for any visits on 12/11/21.  Assessment & Plan     Problem List Items Addressed This Visit       Nervous and Auditory   Lumbar spinal cord injury ([place])    Chronic, stable Presents in w/c today Wishes to be seen by neuro for complaints of nerve pain/leg pain at night hx of lumbar spinal cord injury        Relevant Orders   Ambulatory referral to Neurology   Paraplegia ([place])    Chronic, stable Presents in w/c today Wishes to be seen by neuro for complaints of nerve pain/leg pain at night      Relevant Orders   Ambulatory referral to Neurology     Other   Daytime sleepiness    Chronic,  worsening Believes associated with use of gabapentin  Restless sleep noted d/t leg pain       Relevant Orders   Ambulatory referral to Neurology   Depression, recurrent ([place]) - Primary    Chronic, stable However, reports chronic fatigue and some memory loss/vision changes d/t 5+ year use of gabapentin as well as  Continue current use of lexapro 20 mg and elavil 50 mg Referral to psych for additional options and counseling referral      Relevant Orders   Ambulatory referral to Neurology   Ambulatory referral to Psychiatry   Muscle spasm    Chronic, stable Continue use of baclofen 10 mg and 20 mg daily; encouraged to discuss with neurology        Relevant Medications   baclofen (LIORESAL) 10 MG tablet   Other Relevant Orders   Ambulatory referral to Neurology     Return in about 3 months (around 03/13/2022) for annual examination.      Vonna Kotyk, FNP, have reviewed all documentation for this visit. The documentation on 12/11/21 for the exam, diagnosis, procedures, and orders are all accurate and complete.    [name], [place] [phone] (phone) [phone] (fax)  [place]

## 2021-12-11 ENCOUNTER — Ambulatory Visit: Payer: Medicaid Other | Admitting: Family Medicine

## 2021-12-11 ENCOUNTER — Encounter: Payer: Self-pay | Admitting: Family Medicine

## 2021-12-11 VITALS — BP 119/83 | HR 84 | Temp 98.3°F | Resp 16 | Wt 250.0 lb

## 2021-12-11 DIAGNOSIS — M62838 Other muscle spasm: Secondary | ICD-10-CM | POA: Diagnosis not present

## 2021-12-11 DIAGNOSIS — S34109S Unspecified injury to unspecified level of lumbar spinal cord, sequela: Secondary | ICD-10-CM | POA: Diagnosis not present

## 2021-12-11 DIAGNOSIS — G8220 Paraplegia, unspecified: Secondary | ICD-10-CM

## 2021-12-11 DIAGNOSIS — R400 Somnolence: Secondary | ICD-10-CM | POA: Diagnosis not present

## 2021-12-11 DIAGNOSIS — F339 Major depressive disorder, recurrent, unspecified: Secondary | ICD-10-CM

## 2021-12-11 DIAGNOSIS — Z89421 Acquired absence of other right toe(s): Secondary | ICD-10-CM | POA: Insufficient documentation

## 2021-12-11 MED ORDER — BACLOFEN 10 MG PO TABS: ORAL_TABLET | ORAL | 3 refills | Status: DC

## 2021-12-11 NOTE — Assessment & Plan Note (Signed)
Chronic, stable Continue use of baclofen 10 mg and 20 mg daily; encouraged to discuss with neurology

## 2021-12-11 NOTE — Assessment & Plan Note (Signed)
Chronic, worsening Believes associated with use of gabapentin  Restless sleep noted d/t leg pain

## 2021-12-11 NOTE — Assessment & Plan Note (Signed)
Chronic, stable Presents in w/c today Wishes to be seen by neuro for complaints of nerve pain/leg pain at night hx of lumbar spinal cord injury

## 2021-12-11 NOTE — Assessment & Plan Note (Signed)
Chronic, stable Presents in w/c today Wishes to be seen by neuro for complaints of nerve pain/leg pain at night

## 2021-12-11 NOTE — Assessment & Plan Note (Signed)
Chronic, stable However, reports chronic fatigue and some memory loss/vision changes d/t 5+ year use of gabapentin as well as  Continue current use of lexapro 20 mg and elavil 50 mg Referral to psych for additional options and counseling referral

## 2021-12-29 DIAGNOSIS — Z419 Encounter for procedure for purposes other than remedying health state, unspecified: Secondary | ICD-10-CM | POA: Diagnosis not present
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
## 2022-01-02 ENCOUNTER — Encounter: Payer: Self-pay | Admitting: Obstetrics and Gynecology

## 2022-01-02 ENCOUNTER — Other Ambulatory Visit (HOSPITAL_COMMUNITY)
Admission: RE | Admit: 2022-01-02 | Discharge: 2022-01-02 | Disposition: A | Discharge status: Home / Self Care | Payer: Medicaid Other | Source: Ambulatory Visit | Attending: Obstetrics and Gynecology | Admitting: Obstetrics and Gynecology

## 2022-01-02 ENCOUNTER — Ambulatory Visit (INDEPENDENT_AMBULATORY_CARE_PROVIDER_SITE_OTHER): Payer: Medicaid Other | Admitting: Obstetrics and Gynecology

## 2022-01-02 VITALS — BP 110/70 | Ht 66.5 in | Wt 250.0 lb

## 2022-01-02 DIAGNOSIS — A599 Trichomoniasis, unspecified: Secondary | ICD-10-CM

## 2022-01-02 DIAGNOSIS — Z01419 Encounter for gynecological examination (general) (routine) without abnormal findings: Secondary | ICD-10-CM | POA: Diagnosis not present

## 2022-01-02 DIAGNOSIS — Z0000 Encounter for general adult medical examination without abnormal findings: Secondary | ICD-10-CM | POA: Diagnosis not present

## 2022-01-02 DIAGNOSIS — Z3041 Encounter for surveillance of contraceptive pills: Secondary | ICD-10-CM | POA: Diagnosis not present

## 2022-01-02 DIAGNOSIS — Z113 Encounter for screening for infections with a predominantly sexual mode of transmission: Secondary | ICD-10-CM | POA: Insufficient documentation

## 2022-01-02 MED ORDER — LEVONORGESTREL-ETHINYL ESTRAD 0.1-20 MG-MCG PO TABS: 1.0000 | ORAL_TABLET | Freq: Every day | ORAL | 3 refills | Status: DC

## 2022-01-02 NOTE — Patient Instructions (Signed)
I value your feedback and you entrusting us with your care. If you get a [facility] patient survey, I would appreciate you taking the time to let us know about your experience today. Thank you! ? ? ?

## 2022-01-03 LAB — CERVICOVAGINAL ANCILLARY ONLY
Chlamydia: NEGATIVE
Comment: NEGATIVE
Comment: NEGATIVE
Comment: NORMAL
Neisseria Gonorrhea: NEGATIVE
Trichomonas: POSITIVE — AB

## 2022-01-04 MED ORDER — METRONIDAZOLE 500 MG PO TABS: ORAL_TABLET | ORAL | 0 refills | Status: DC

## 2022-01-04 NOTE — Progress Notes (Signed)
Pls contact pt to do treatment for her partner if this is one of the STDs we can treat? If not, let me know. Thx.

## 2022-01-04 NOTE — Addendum Note (Signed)
Addended by: Althea Grimmer B on: 01/04/2022 09:46 PM   Modules accepted: Orders

## 2022-01-07 ENCOUNTER — Telehealth: Payer: Self-pay

## 2022-01-07 DIAGNOSIS — A599 Trichomoniasis, unspecified: Secondary | ICD-10-CM

## 2022-01-07 MED ORDER — METRONIDAZOLE 500 MG PO TABS
ORAL_TABLET | ORAL | 0 refills | Status: DC
Start: 2022-01-07 — End: 2022-07-04

## 2022-01-07 NOTE — Telephone Encounter (Signed)
-----   Message from Rica Records, PA-C sent at 01/04/2022  9:47 PM EDT ----- Pls contact pt to do treatment for her partner if this is one of the STDs we can treat? If not, let me know. Thx.

## 2022-01-07 NOTE — Telephone Encounter (Signed)
Called Angela [name] and gave her the results. She does want her partner to be treated as well.   Partner: Ave Filter 10/12/1996  Prescription and expedited partner therapy left up front for pick up.

## 2022-01-17 ENCOUNTER — Ambulatory Visit (INDEPENDENT_AMBULATORY_CARE_PROVIDER_SITE_OTHER): Payer: Medicaid Other | Admitting: Diagnostic Neuroimaging

## 2022-01-17 ENCOUNTER — Encounter: Payer: Self-pay | Admitting: Diagnostic Neuroimaging

## 2022-01-17 VITALS — BP 119/78 | HR 92 | Ht 66.0 in

## 2022-01-17 DIAGNOSIS — S34109S Unspecified injury to unspecified level of lumbar spinal cord, sequela: Secondary | ICD-10-CM | POA: Diagnosis not present

## 2022-01-17 DIAGNOSIS — G839 Paralytic syndrome, unspecified: Secondary | ICD-10-CM

## 2022-01-17 DIAGNOSIS — M62838 Other muscle spasm: Secondary | ICD-10-CM | POA: Diagnosis not present
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
## 2022-01-22 ENCOUNTER — Telehealth: Payer: Self-pay | Admitting: Diagnostic Neuroimaging

## 2022-01-22 NOTE — Telephone Encounter (Signed)
[facility] Angela [name]) Records were clear and wrong demographics were sent over. Need records resent and demographics. Fax: [phone]

## 2022-01-22 NOTE — Telephone Encounter (Signed)
Sent!

## 2022-01-22 NOTE — Telephone Encounter (Signed)
Received fax that the patient has an appointment at the [facility] location on 01/23/22 at 11am

## 2022-01-23 DIAGNOSIS — S34109D Unspecified injury to unspecified level of lumbar spinal cord, subsequent encounter: Secondary | ICD-10-CM | POA: Diagnosis not present

## 2022-01-23 DIAGNOSIS — G8929 Other chronic pain: Secondary | ICD-10-CM | POA: Diagnosis not present

## 2022-01-24 ENCOUNTER — Ambulatory Visit: Payer: Medicaid Other | Admitting: Obstetrics and Gynecology

## 2022-01-25 ENCOUNTER — Ambulatory Visit: Payer: Medicaid Other | Admitting: Obstetrics and Gynecology

## 2022-01-29 ENCOUNTER — Telehealth: Payer: Self-pay

## 2022-01-29 DIAGNOSIS — Z419 Encounter for procedure for purposes other than remedying health state, unspecified: Secondary | ICD-10-CM | POA: Diagnosis not present

## 2022-01-29 NOTE — Telephone Encounter (Unsigned)
Copied from CRM [id number]. Topic: General - Inquiry >> Jan 29, 2022  4:54 PM Runell Gess P wrote: Reason for CRM: Pt called saying she has an appt tomorrow afternoon at [facility] for pain.  They are wanting to know what vaccines she has a record of.  Patient would like a nurse to call her back and give her a list of the vaccines in her records  CB@  [phone]

## 2022-01-30 DIAGNOSIS — G8921 Chronic pain due to trauma: Secondary | ICD-10-CM | POA: Diagnosis not present

## 2022-01-30 DIAGNOSIS — Z3200 Encounter for pregnancy test, result unknown: Secondary | ICD-10-CM | POA: Diagnosis not present

## 2022-01-30 DIAGNOSIS — Z79899 Other long term (current) drug therapy: Secondary | ICD-10-CM | POA: Diagnosis not present

## 2022-01-30 DIAGNOSIS — G8929 Other chronic pain: Secondary | ICD-10-CM | POA: Diagnosis not present

## 2022-01-30 DIAGNOSIS — M792 Neuralgia and neuritis, unspecified: Secondary | ICD-10-CM | POA: Diagnosis not present

## 2022-01-30 DIAGNOSIS — Z0130 Encounter for examination of blood pressure without abnormal findings: Secondary | ICD-10-CM | POA: Diagnosis not present

## 2022-01-30 DIAGNOSIS — S34109S Unspecified injury to unspecified level of lumbar spinal cord, sequela: Secondary | ICD-10-CM | POA: Diagnosis not present

## 2022-01-30 DIAGNOSIS — M549 Dorsalgia, unspecified: Secondary | ICD-10-CM | POA: Diagnosis not present

## 2022-01-30 DIAGNOSIS — Z6839 Body mass index (BMI) 39.0-39.9, adult: Secondary | ICD-10-CM | POA: Diagnosis not present

## 2022-01-30 NOTE — Telephone Encounter (Signed)
Called patient that to see if she wanted to come by and pick up a copy of her vaccine record. Per patient she is going to see if they actually need the record that she will call us to provide the fax number.

## 2022-02-01 ENCOUNTER — Other Ambulatory Visit: Payer: Self-pay | Admitting: Family Medicine

## 2022-02-01 DIAGNOSIS — G8220 Paraplegia, unspecified: Secondary | ICD-10-CM

## 2022-02-01 DIAGNOSIS — F419 Anxiety disorder, unspecified: Secondary | ICD-10-CM

## 2022-02-01 DIAGNOSIS — M62838 Other muscle spasm: Secondary | ICD-10-CM

## 2022-02-01 DIAGNOSIS — S34109S Unspecified injury to unspecified level of lumbar spinal cord, sequela: Secondary | ICD-10-CM

## 2022-02-01 DIAGNOSIS — F32A Depression, unspecified: Secondary | ICD-10-CM

## 2022-02-01 DIAGNOSIS — G839 Paralytic syndrome, unspecified: Secondary | ICD-10-CM

## 2022-02-01 DIAGNOSIS — M792 Neuralgia and neuritis, unspecified: Secondary | ICD-10-CM

## 2022-02-01 NOTE — Telephone Encounter (Signed)
Requested Prescriptions  Pending Prescriptions Disp Refills  . amitriptyline (ELAVIL) 50 MG tablet [Pharmacy Med Name: Amitriptyline HCl 50 MG Oral Tablet] 90 tablet 0    Sig: TAKE 1 TABLET BY MOUTH AT BEDTIME     Psychiatry:  Antidepressants - Heterocyclics (TCAs) Passed - 02/01/2022 11:10 AM      Passed - Completed PHQ-2 or PHQ-9 in the last 360 days      Passed - Valid encounter within last 6 months    Recent Outpatient Visits          1 month ago Depression, recurrent [facility])   [facility] Jacky Kindle, FNP   4 months ago Rash   [facility], [place], PA-C   8 months ago Viral URI   [facility] [place] Dense M, DO   9 months ago Depression, recurrent [facility])   [facility] Jacky Kindle, FNP   1 year ago Paraplegia [facility])   [facility] Fl[place], Eula Fried, FNP      Future Appointments            In 4 months Suzie Portela, Daryl [place], FNP [facility]

## 2022-02-03 DIAGNOSIS — Z79899 Other long term (current) drug therapy: Secondary | ICD-10-CM | POA: Diagnosis not present

## 2022-02-07 ENCOUNTER — Encounter (HOSPITAL_COMMUNITY): Payer: Self-pay

## 2022-02-07 ENCOUNTER — Telehealth (INDEPENDENT_AMBULATORY_CARE_PROVIDER_SITE_OTHER): Payer: Medicaid Other | Admitting: Clinical

## 2022-02-07 DIAGNOSIS — F4310 Post-traumatic stress disorder, unspecified: Secondary | ICD-10-CM | POA: Diagnosis not present

## 2022-02-07 DIAGNOSIS — F419 Anxiety disorder, unspecified: Secondary | ICD-10-CM | POA: Diagnosis not present

## 2022-02-07 DIAGNOSIS — F331 Major depressive disorder, recurrent, moderate: Secondary | ICD-10-CM | POA: Diagnosis not present

## 2022-02-07 NOTE — Plan of Care (Signed)
Verbal Consent 
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
## 2022-02-08 DIAGNOSIS — F419 Anxiety disorder, unspecified: Secondary | ICD-10-CM | POA: Diagnosis not present

## 2022-02-08 DIAGNOSIS — Z6839 Body mass index (BMI) 39.0-39.9, adult: Secondary | ICD-10-CM | POA: Diagnosis not present

## 2022-02-08 DIAGNOSIS — M62838 Other muscle spasm: Secondary | ICD-10-CM | POA: Diagnosis not present

## 2022-02-08 DIAGNOSIS — Z79899 Other long term (current) drug therapy: Secondary | ICD-10-CM | POA: Diagnosis not present

## 2022-02-08 DIAGNOSIS — M549 Dorsalgia, unspecified: Secondary | ICD-10-CM | POA: Diagnosis not present

## 2022-02-08 DIAGNOSIS — G8921 Chronic pain due to trauma: Secondary | ICD-10-CM | POA: Diagnosis not present

## 2022-02-08 DIAGNOSIS — Z0130 Encounter for examination of blood pressure without abnormal findings: Secondary | ICD-10-CM | POA: Diagnosis not present

## 2022-02-08 DIAGNOSIS — Z3200 Encounter for pregnancy test, result unknown: Secondary | ICD-10-CM | POA: Diagnosis not present

## 2022-02-08 DIAGNOSIS — F5101 Primary insomnia: Secondary | ICD-10-CM | POA: Diagnosis not present

## 2022-02-08 DIAGNOSIS — E669 Obesity, unspecified: Secondary | ICD-10-CM | POA: Diagnosis not present

## 2022-02-08 DIAGNOSIS — F32A Depression, unspecified: Secondary | ICD-10-CM | POA: Diagnosis not present

## 2022-02-14 DIAGNOSIS — Z79899 Other long term (current) drug therapy: Secondary | ICD-10-CM | POA: Diagnosis not present

## 2022-02-26 ENCOUNTER — Ambulatory Visit (INDEPENDENT_AMBULATORY_CARE_PROVIDER_SITE_OTHER): Payer: Medicaid Other | Admitting: Clinical

## 2022-02-26 DIAGNOSIS — F4310 Post-traumatic stress disorder, unspecified: Secondary | ICD-10-CM

## 2022-02-26 DIAGNOSIS — F419 Anxiety disorder, unspecified: Secondary | ICD-10-CM

## 2022-02-26 DIAGNOSIS — F331 Major depressive disorder, recurrent, moderate: Secondary | ICD-10-CM | POA: Diagnosis not present
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
## 2022-03-01 DIAGNOSIS — Z419 Encounter for procedure for purposes other than remedying health state, unspecified: Secondary | ICD-10-CM | POA: Diagnosis not present

## 2022-03-11 DIAGNOSIS — S93401A Sprain of unspecified ligament of right ankle, initial encounter: Secondary | ICD-10-CM | POA: Diagnosis not present

## 2022-03-11 DIAGNOSIS — J069 Acute upper respiratory infection, unspecified: Secondary | ICD-10-CM | POA: Diagnosis not present

## 2022-03-11 DIAGNOSIS — M25571 Pain in right ankle and joints of right foot: Secondary | ICD-10-CM | POA: Diagnosis not present

## 2022-03-11 DIAGNOSIS — H1033 Unspecified acute conjunctivitis, bilateral: Secondary | ICD-10-CM | POA: Diagnosis not present

## 2022-03-18 DIAGNOSIS — M792 Neuralgia and neuritis, unspecified: Secondary | ICD-10-CM | POA: Diagnosis not present

## 2022-03-18 DIAGNOSIS — E669 Obesity, unspecified: Secondary | ICD-10-CM | POA: Diagnosis not present

## 2022-03-18 DIAGNOSIS — M62838 Other muscle spasm: Secondary | ICD-10-CM | POA: Diagnosis not present

## 2022-03-18 DIAGNOSIS — M549 Dorsalgia, unspecified: Secondary | ICD-10-CM | POA: Diagnosis not present

## 2022-03-18 DIAGNOSIS — Z0130 Encounter for examination of blood pressure without abnormal findings: Secondary | ICD-10-CM | POA: Diagnosis not present

## 2022-03-18 DIAGNOSIS — F419 Anxiety disorder, unspecified: Secondary | ICD-10-CM | POA: Diagnosis not present

## 2022-03-18 DIAGNOSIS — F5101 Primary insomnia: Secondary | ICD-10-CM | POA: Diagnosis not present

## 2022-03-18 DIAGNOSIS — F32A Depression, unspecified: Secondary | ICD-10-CM | POA: Diagnosis not present

## 2022-03-18 DIAGNOSIS — G8921 Chronic pain due to trauma: Secondary | ICD-10-CM | POA: Diagnosis not present

## 2022-03-18 DIAGNOSIS — Z6838 Body mass index (BMI) 38.0-38.9, adult: Secondary | ICD-10-CM | POA: Diagnosis not present

## 2022-03-21 ENCOUNTER — Ambulatory Visit: Payer: Self-pay | Admitting: *Deleted

## 2022-03-21 ENCOUNTER — Telehealth: Payer: Medicaid Other | Admitting: Family Medicine

## 2022-03-21 DIAGNOSIS — B3731 Acute candidiasis of vulva and vagina: Secondary | ICD-10-CM

## 2022-03-21 MED ORDER — FLUCONAZOLE 150 MG PO TABS: 150.0000 mg | ORAL_TABLET | Freq: Once | ORAL | 0 refills | Status: AC

## 2022-03-21 NOTE — Telephone Encounter (Signed)
  Chief Complaint: Urinary frequency Symptoms: Urinary frequency, vaginal burning Frequency: Tuesday Pertinent Negatives: Patient denies vaginal discharge, back pain, urgency,fever Disposition: [] ED /[] Urgent Care (no appt availability in office) / [] Appointment(In office/virtual)/ [x]  [facility]/ [] Home Care/ [] Refused Recommended Disposition /[]  Mobile Bus/ []  Follow-up with PCP Additional Notes: Virtual UC appt secured for pt as needed appt today due to transportation issues. Care advise provided, pt verbalizes understanding. Reason for Disposition  Urinating more frequently than usual (i.e., frequency)  Answer Assessment - Initial Assessment Questions 1. SYMPTOM: "What's the main symptom you're concerned about?" (e.g., frequency, incontinence)     Frequency 2. ONSET: "When did the    start?"     Tuesday 3. PAIN: "Is there any pain?" If Yes, ask: "How bad is it?" (Scale: 1-10; mild, moderate, severe)     Burning 4. CAUSE: "What do you think is causing the symptoms?"     Antibiotics 5. OTHER SYMPTOMS: "Do you have any other symptoms?" (e.g., blood in urine, fever, flank pain, pain with urination)     No  Protocols used: Urinary Symptoms-A-AH

## 2022-03-21 NOTE — Telephone Encounter (Signed)
Message from Oneta Rack sent at 03/21/2022  8:49 AM EDT  Summary: ? yeast infection   Patient was seen at [facility] a week ago for a cough and pink eye. Patient does not know the name of the medication prescribed but she thinks it caused a yeast infection. Patient would like PCP to prescribe a medication to treat her yeast infection. Patient is currently experiencing frequent urination, vaginal irration, burning for 1 week. Patient completed the antibiotics prescribed at urgent car to treat cold and pink eye.           Call History   Type Contact Phone/Fax User  03/21/2022 08:43 AM EDT Phone (Incoming) Angela [name], Angela [name] (Self) [phone] [place]

## 2022-03-21 NOTE — Patient Instructions (Signed)
Angela [name], thank you for joining Perlie Mayo, NP for today's virtual visit.  While this provider is not your primary care provider (PCP), if your PCP is located in our provider database this encounter information will be shared with them immediately following your visit.  Consent: (Patient) Angela [name] provided verbal consent for this virtual visit at the beginning of the encounter.  Current Medications:  Current Outpatient Medications:    fluconazole (DIFLUCAN) 150 MG tablet, Take 1 tablet (150 mg total) by mouth once for 1 dose., Disp: 1 tablet, Rfl: 0   amitriptyline (ELAVIL) 50 MG tablet, TAKE 1 TABLET BY MOUTH AT BEDTIME, Disp: 90 tablet, Rfl: 1   baclofen (LIORESAL) 10 MG tablet, 10 mg in AM; 20 mg in PM, Disp: 270 each, Rfl: 3   DM-Phenylephrine-Acetaminophen 10-5-325 MG CAPS, Take 2 capsules by mouth every 4 (four) hours as needed., Disp: 70 capsule, Rfl: 0   escitalopram (LEXAPRO) 20 MG tablet, Take 1 tablet (20 mg total) by mouth daily. (Patient not taking: Reported on 01/17/2022), Disp: 90 tablet, Rfl: 3   gabapentin (NEURONTIN) 400 MG capsule, TAKE 2 CAPSULES BY MOUTH 4 TIMES DAILY, Disp: 720 capsule, Rfl: 1   ibuprofen (ADVIL) 800 MG tablet, Take 1 tablet (800 mg total) by mouth every 6 (six) hours as needed for moderate pain., Disp: 20 tablet, Rfl: 0   levonorgestrel-ethinyl estradiol (AVIANE) 0.1-20 MG-MCG tablet, Take 1 tablet by mouth daily., Disp: 84 tablet, Rfl: 3   metroNIDAZOLE (FLAGYL) 500 MG tablet, Take 2 tabs BID for 1 day (Patient not taking: Reported on 01/17/2022), Disp: 4 tablet, Rfl: 0   metroNIDAZOLE (FLAGYL) 500 MG tablet, Take two tablets by mouth twice a day, for one day.  Or you can take all four tablets at once if you can tolerate it. (Patient not taking: Reported on 01/17/2022), Disp: 4 tablet, Rfl: 0   Vitamin D, Ergocalciferol, (DRISDOL) 1.25 MG (50000 UNIT) CAPS capsule, Take 1 capsule (50,000 Units total) by mouth every 7 (seven) days. (taking  one tablet per week) walk in lab in office 1-2 weeks after completing prescription. (Patient not taking: Reported on 01/17/2022), Disp: 12 capsule, Rfl: 0   Medications ordered in this encounter:  Meds ordered this encounter  Medications   fluconazole (DIFLUCAN) 150 MG tablet    Sig: Take 1 tablet (150 mg total) by mouth once for 1 dose.    Dispense:  1 tablet    Refill:  0    Order Specific Question:   Supervising Provider    Answer:   Chase Picket [id number]     *If you need refills on other medications prior to your next appointment, please contact your pharmacy*  Follow-Up: Call back or seek an in-person evaluation if the symptoms worsen or if the condition fails to improve as anticipated.  Other Instructions Vaginal Yeast Infection, Adult  Vaginal yeast infection is a condition that causes vaginal discharge as well as soreness, swelling, and redness (inflammation) of the vagina. This is a common condition. Some women get this infection frequently. What are the causes? This condition is caused by a change in the normal balance of the yeast (Candida) and normal bacteria that live in the vagina. This change causes an overgrowth of yeast, which causes the inflammation. What increases the risk? The condition is more likely to develop in women who: Take antibiotic medicines. Have diabetes. Take birth control pills. Are pregnant. Douche often. Have a weak body defense system (immune system). Have  been taking steroid medicines for a long time. Frequently wear tight clothing. What are the signs or symptoms? Symptoms of this condition include: White, thick, creamy vaginal discharge. Swelling, itching, redness, and irritation of the vagina. The lips of the vagina (labia) may be affected as well. Pain or a burning feeling while urinating. Pain during sex. How is this diagnosed? This condition is diagnosed based on: Your medical history. A physical exam. A pelvic exam. Your  health care provider will examine a sample of your vaginal discharge under a microscope. Your health care provider may send this sample for testing to confirm the diagnosis. How is this treated? This condition is treated with medicine. Medicines may be over-the-counter or prescription. You may be told to use one or more of the following: Medicine that is taken by mouth (orally). Medicine that is applied as a cream (topically). Medicine that is inserted directly into the vagina (suppository). Follow these instructions at home: Take or apply over-the-counter and prescription medicines only as told by your health care provider. Do not use tampons until your health care provider approves. Do not have sex until your infection has cleared. Sex can prolong or worsen your symptoms of infection. Ask your health care provider when it is safe to resume sexual activity. Keep all follow-up visits. This is important. How is this prevented?  Do not wear tight clothes, such as pantyhose or tight pants. Wear breathable cotton underwear. Do not use douches, perfumed soap, creams, or powders. Wipe from front to back after using the toilet. If you have diabetes, keep your blood sugar levels under control. Ask your health care provider for other ways to prevent yeast infections. Contact a health care provider if: You have a fever. Your symptoms go away and then return. Your symptoms do not get better with treatment. Your symptoms get worse. You have new symptoms. You develop blisters in or around your vagina. You have blood coming from your vagina and it is not your menstrual period. You develop pain in your abdomen. Summary Vaginal yeast infection is a condition that causes discharge as well as soreness, swelling, and redness (inflammation) of the vagina. This condition is treated with medicine. Medicines may be over-the-counter or prescription. Take or apply over-the-counter and prescription medicines  only as told by your health care provider. Do not douche. Resume sexual activity or use of tampons as instructed by your health care provider. Contact a health care provider if your symptoms do not get better with treatment or your symptoms go away and then return. This information is not intended to replace advice given to you by your health care provider. Make sure you discuss any questions you have with your health care provider. Document Revised: 09/04/2020 Document Reviewed: 09/04/2020 Elsevier Patient Education  2023 Elsevier Inc.    If you have been instructed to have an in-person evaluation today at a local Urgent Care facility, please use the link below. It will take you to a list of all of our available [facility], including address, phone number and hours of operation. Please do not delay care.  [facility]  If you or a family member do not have a primary care provider, use the link below to schedule a visit and establish care. When you choose a [facility] primary care physician or advanced practice provider, you gain a long-term partner in health. Find a Primary Care Provider  Learn more about [facility]'s in-office and virtual care options: [facility] -  Get Care Now

## 2022-03-21 NOTE — Telephone Encounter (Signed)
Left voicemail to call back to discuss symptoms with a nurse. 

## 2022-03-21 NOTE — Progress Notes (Signed)
Virtual Visit Consent   Angela [name], you are scheduled for a virtual visit with a [facility] provider today. Just as with appointments in the office, your consent must be obtained to participate. Your consent will be active for this visit and any virtual visit you may have with one of our providers in the next 365 days. If you have a MyChart account, a copy of this consent can be sent to you electronically.  As this is a virtual visit, video technology does not allow for your provider to perform a traditional examination. This may limit your provider's ability to fully assess your condition. If your provider identifies any concerns that need to be evaluated in person or the need to arrange testing (such as labs, EKG, etc.), we will make arrangements to do so. Although advances in technology are sophisticated, we cannot ensure that it will always work on either your end or our end. If the connection with a video visit is poor, the visit may have to be switched to a telephone visit. With either a video or telephone visit, we are not always able to ensure that we have a secure connection.  By engaging in this virtual visit, you consent to the provision of healthcare and authorize for your insurance to be billed (if applicable) for the services provided during this visit. Depending on your insurance coverage, you may receive a charge related to this service.  I need to obtain your verbal consent now. Are you willing to proceed with your visit today? Angela [name] has provided verbal consent on 03/21/2022 for a virtual visit (video or telephone). Angela Mayo, NP  Date: 03/21/2022 10:03 AM  Virtual Visit via Video Note   I, Angela [name], connected with  Angela [name]  ([id number], 2000-12-28) on 03/21/22 at 10:00 AM EDT by a video-enabled telemedicine application and verified that I am speaking with the correct person using two identifiers.  Location: Patient: Virtual Visit Location Patient:  Home Provider: Virtual Visit Location Provider: Home Office   I discussed the limitations of evaluation and management by telemedicine and the availability of in person appointments. The patient expressed understanding and agreed to proceed.    History of Present Illness: Angela [name] is a 21 y.o. who identifies as a female who was assigned female at birth, and is being seen today for increased urination and itching vaginal.  HPI: Vaginal Itching The patient's primary symptoms include genital itching. The patient's pertinent negatives include no genital lesions, genital odor, genital rash, missed menses, pelvic pain, vaginal bleeding or vaginal discharge. This is a new problem. The current episode started in the past 7 days. The problem occurs constantly. The problem has been gradually worsening. She is not pregnant. Associated symptoms include frequency. Pertinent negatives include no abdominal pain, anorexia, back pain, chills, constipation, diarrhea, discolored urine, dysuria, fever, flank pain, headaches, hematuria, joint pain, joint swelling, nausea, painful intercourse, rash, sore throat, urgency or vomiting. Nothing aggravates the symptoms. No, her partner does not have an STD. She uses oral contraceptives for contraception. Her menstrual history has been irregular. Her past medical history is significant for vaginosis.    Problems:  Patient Active Problem List   Diagnosis Date Noted   History of complete ray amputation of fifth toe of right foot ([place]) 12/11/2021   Daytime sleepiness 12/11/2021   Need for influenza vaccination 04/27/2021   Insomnia 04/27/2021   Anxiety 04/27/2021   Muscle spasm 09/18/2020   Depression, recurrent ([place])  09/18/2020   Nerve pain 09/18/2020   Need for tetanus booster 09/18/2020   Anxiety and depression 01/14/2018   Paraplegia (HCC) 07/02/2017   Acute blood loss anemia 03/24/2015   Lumbar spinal cord injury (HCC)    Paralysis (HCC)    Adjustment  reaction of adolescence     Allergies:  Allergies  Allergen Reactions   Vancomycin Rash    Had red itchy rash of face and neck Had red itchy rash of face and neck Had red itchy rash of face and neck   Latex Rash   Medications:  Current Outpatient Medications:    amitriptyline (ELAVIL) 50 MG tablet, TAKE 1 TABLET BY MOUTH AT BEDTIME, Disp: 90 tablet, Rfl: 1   baclofen (LIORESAL) 10 MG tablet, 10 mg in AM; 20 mg in PM, Disp: 270 each, Rfl: 3   DM-Phenylephrine-Acetaminophen 10-5-325 MG CAPS, Take 2 capsules by mouth every 4 (four) hours as needed., Disp: 70 capsule, Rfl: 0   escitalopram (LEXAPRO) 20 MG tablet, Take 1 tablet (20 mg total) by mouth daily. (Patient not taking: Reported on 01/17/2022), Disp: 90 tablet, Rfl: 3   gabapentin (NEURONTIN) 400 MG capsule, TAKE 2 CAPSULES BY MOUTH 4 TIMES DAILY, Disp: 720 capsule, Rfl: 1   ibuprofen (ADVIL) 800 MG tablet, Take 1 tablet (800 mg total) by mouth every 6 (six) hours as needed for moderate pain., Disp: 20 tablet, Rfl: 0   levonorgestrel-ethinyl estradiol (AVIANE) 0.1-20 MG-MCG tablet, Take 1 tablet by mouth daily., Disp: 84 tablet, Rfl: 3   metroNIDAZOLE (FLAGYL) 500 MG tablet, Take 2 tabs BID for 1 day (Patient not taking: Reported on 01/17/2022), Disp: 4 tablet, Rfl: 0   metroNIDAZOLE (FLAGYL) 500 MG tablet, Take two tablets by mouth twice a day, for one day.  Or you can take all four tablets at once if you can tolerate it. (Patient not taking: Reported on 01/17/2022), Disp: 4 tablet, Rfl: 0   Vitamin D, Ergocalciferol, (DRISDOL) 1.25 MG (50000 UNIT) CAPS capsule, Take 1 capsule (50,000 Units total) by mouth every 7 (seven) days. (taking one tablet per week) walk in lab in office 1-2 weeks after completing prescription. (Patient not taking: Reported on 01/17/2022), Disp: 12 capsule, Rfl: 0  Observations/Objective: Patient is well-developed, well-nourished in no acute distress.  Resting comfortably  at home.  Head is normocephalic,  atraumatic.  No labored breathing.  Speech is clear and coherent with logical content.  Patient is alert and oriented at baseline.    Assessment and Plan: 1. Yeast vaginitis  - fluconazole (DIFLUCAN) 150 MG tablet; Take 1 tablet (150 mg total) by mouth once for 1 dose.  Dispense: 1 tablet; Refill: 0  -review of causes - most likely from abx use recently  -tx with diflucan -in person precautions reviewed   Reviewed side effects, risks and benefits of medication.    Patient acknowledged agreement and understanding of the plan.   Past Medical, Surgical, Social History, Allergies, and Medications have been Reviewed.   Follow Up Instructions: I discussed the assessment and treatment plan with the patient. The patient was provided an opportunity to ask questions and all were answered. The patient agreed with the plan and demonstrated an understanding of the instructions.  A copy of instructions were sent to the patient via MyChart unless otherwise noted below.     The patient was advised to call back or seek an in-person evaluation if the symptoms worsen or if the condition fails to improve as anticipated.  Time:  I spent  10 minutes with the patient via telehealth technology discussing the above problems/concerns.    Angela Mayo, NP

## 2022-03-26 ENCOUNTER — Ambulatory Visit (INDEPENDENT_AMBULATORY_CARE_PROVIDER_SITE_OTHER): Payer: Medicaid Other | Admitting: Clinical

## 2022-03-26 DIAGNOSIS — F331 Major depressive disorder, recurrent, moderate: Secondary | ICD-10-CM

## 2022-03-26 DIAGNOSIS — F4310 Post-traumatic stress disorder, unspecified: Secondary | ICD-10-CM | POA: Diagnosis not present

## 2022-03-26 DIAGNOSIS — F419 Anxiety disorder, unspecified: Secondary | ICD-10-CM

## 2022-03-26 NOTE — Progress Notes (Signed)
Virtual Visit via Telephone Note   I connected with Angela [name] on 03/26/22 at 2:00 PM EST by telephone and verified that I am speaking with the correct person using two identifiers.   Location: Patient: Home Provider: Office   I discussed the limitations, risks, security and privacy concerns of performing an evaluation and management service by telephone and the availability of in person appointments. I also discussed with the patient that there may be a patient responsible charge related to this service. The patient expressed understanding and agreed to proceed.     THERAPIST PROGRESS NOTE   Session Time: 2:00 PM-2:30 PM   Participation Level: Active   Behavioral Response: CasualAlertDepressed   Type of Therapy: Individual Therapy   Treatment Goals addressed: Coping   Interventions: CBT, Motivational Interviewing, Strength-based and Supportive   Summary: Angela [name] is a 21 y.o. female who presents with Depression with Anxiety and PTSD.The OPT therapist worked with the patient for her ongoing outpatient. OPT treatment. The OPT therapist utilized Motivational Interviewing to assist in creating therapeutic repore. The patient in the session was engaged and work in collaboration giving feedback about her triggers and symptoms over the past few weeks.The patient spoke about a recent ankle injury that may require surgery, she was evaluated at a local urgent care and has a upcoming appointment with orthopedics on 04/04/2022 for follow up. The patient spoke about adjusting to and looking forward to Fall season including upcoming holidays .The OPT therapist utilized Cognitive Behavioral Therapy through cognitive restructuring as well as worked with the patient on self awareness and implementing coping strategies to assist in management of current stressors.The patient was encouraged to continue work towards her goals including getting license and continuing her education.The patient  spoke about the impact f her ankle injury in respect to sleep, mood, and mobility.The OPT therapist provided ongoing psychoeducation  and support throughout the session with the patient.     Suicidal/Homicidal: Nowithout intent/plan   Therapist Response: The OPT therapist worked with the patient for the patients scheduled session. The patient was engaged in her session and gave feedback in relation to triggers, symptoms, and behavior responses over the past few weeks including the impact of changes  in the home wit her younger brother being in his senior year and her younger sister recently starting her first job..The OPT therapist worked with the patient utilizing an in session Cognitive Behavioral Therapy exercise. The patient was responsive in the session and verbalized, " I am really hoping that I do not have to have surgery on my ankle, I am wanting to go maybe to haunted houses for Halloween, but I have been taking advantage of not having mobility and resting staying off my foot". The OPT therapist gauged the patients mood baseline over the past few weeks with understanding of recent injury and ongoing pain from swelling of the foot and ankle due to injury. The OPT therapist will continue treatment work with the patient in her next scheduled session post her upcoming orthopedic appointment on 04/04/2022.   Plan: Return again in 2/3 weeks.   Diagnosis:      Axis I: Recurrent Moderate Depression with anxiety and PTSD (post-traumatic stress disorder)                           Axis II: No diagnosis       Collaboration of Care: No additional Collaboration for this session.   Patient/Guardian was advised  Release of Information must be obtained prior to any record release in order to collaborate their care with an outside provider. Patient/Guardian was advised if they have not already done so to contact the registration department to sign all necessary forms in order for [place] to release information  regarding their care.    Consent: Patient/Guardian gives verbal consent for treatment and assignment of benefits for services provided during this visit. Patient/Guardian expressed understanding and agreed to proceed.    I discussed the assessment and treatment plan with the patient. The patient was provided an opportunity to ask questions and all were answered. The patient agreed with the plan and demonstrated an understanding of the instructions.   The patient was advised to call back or seek an in-person evaluation if the symptoms worsen or if the condition fails to improve as anticipated.   I provided 30 minutes of non-face-to-face time during this encounter.     Lennox Grumbles, LCSW   03/26/2022

## 2022-03-31 DIAGNOSIS — Z419 Encounter for procedure for purposes other than remedying health state, unspecified: Secondary | ICD-10-CM | POA: Diagnosis not present

## 2022-04-09 ENCOUNTER — Ambulatory Visit (INDEPENDENT_AMBULATORY_CARE_PROVIDER_SITE_OTHER): Payer: Medicaid Other | Admitting: Clinical

## 2022-04-09 DIAGNOSIS — F419 Anxiety disorder, unspecified: Secondary | ICD-10-CM

## 2022-04-09 DIAGNOSIS — F331 Major depressive disorder, recurrent, moderate: Secondary | ICD-10-CM | POA: Diagnosis not present

## 2022-04-09 DIAGNOSIS — F4310 Post-traumatic stress disorder, unspecified: Secondary | ICD-10-CM | POA: Diagnosis not present

## 2022-04-09 NOTE — Progress Notes (Signed)
Virtual Visit via Telephone Note   I connected with Angela [name] on 04/09/22 at 2:00 PM EST by telephone and verified that I am speaking with the correct person using two identifiers.   Location: Patient: Home Provider: Office   I discussed the limitations, risks, security and privacy concerns of performing an evaluation and management service by telephone and the availability of in person appointments. I also discussed with the patient that there may be a patient responsible charge related to this service. The patient expressed understanding and agreed to proceed.     THERAPIST PROGRESS NOTE   Session Time: 2:00 PM-2:30 PM   Participation Level: Active   Behavioral Response: CasualAlertDepressed   Type of Therapy: Individual Therapy   Treatment Goals addressed: Coping   Interventions: CBT, Motivational Interviewing, Strength-based and Supportive   Summary: Angela [name] is a 21 y.o. female who presents with Depression with Anxiety and PTSD.The OPT therapist worked with the patient for her ongoing outpatient. OPT treatment. The OPT therapist utilized Motivational Interviewing to assist in creating therapeutic repore. The patient in the session was engaged and work in collaboration giving feedback about her triggers and symptoms over the past few weeks.The patient spoke her involvement with a friends Mothers wedding over the course of the past week. The patient spoke.about rescheduling her appointment for  ankle injury for this coming Monday. The patient spoke about adjusting to the Fall season including upcoming holidays .The OPT therapist utilized Cognitive Behavioral Therapy through cognitive restructuring as well as worked with the patient on self awareness and implementing coping strategies to assist in management of current stressors.The patient was encouraged to continue work towards her goals including getting license and continuing her education.The patient spoke about  agreement to call [place] for more information about the Spring 2024 semester.The OPT therapist provided ongoing psychoeducation and support throughout the session with the patient.     Suicidal/Homicidal: Nowithout intent/plan   Therapist Response: The OPT therapist worked with the patient for the patients scheduled session. The patient was engaged in her session and gave feedback in relation to triggers, symptoms, and behavior responses over the past few weeks..The OPT therapist worked with the patient utilizing an in session Cognitive Behavioral Therapy exercise. The patient was responsive in the session and verbalized, " I am really looking forward to Halloween and going to the pumpkin patch". The OPT therapist gauged the patients mood baseline over the past few weeks. The patient verbalized being mindful of her ankle injury and has been working to stay off her injured foot.The OPT therapist will continue treatment work with the patient in her next scheduled session.   Plan: Return again in 2/3 weeks.   Diagnosis:      Axis I: Recurrent Moderate Depression with anxiety and PTSD (post-traumatic stress disorder)                           Axis II: No diagnosis       Collaboration of Care: No additional Collaboration for this session.   Patient/Guardian was advised Release of Information must be obtained prior to any record release in order to collaborate their care with an outside provider. Patient/Guardian was advised if they have not already done so to contact the registration department to sign all necessary forms in order for [place] to release information regarding their care.    Consent: Patient/Guardian gives verbal consent for treatment and assignment of benefits for  services provided during this visit. Patient/Guardian expressed understanding and agreed to proceed.    I discussed the assessment and treatment plan with the patient. The patient was provided an opportunity to ask  questions and all were answered. The patient agreed with the plan and demonstrated an understanding of the instructions.   The patient was advised to call back or seek an in-person evaluation if the symptoms worsen or if the condition fails to improve as anticipated.   I provided 30 minutes of non-face-to-face time during this encounter.     Winfred Burn, LCSW   04/09/2022

## 2022-04-15 DIAGNOSIS — M25373 Other instability, unspecified ankle: Secondary | ICD-10-CM | POA: Insufficient documentation

## 2022-04-15 DIAGNOSIS — M25371 Other instability, right ankle: Secondary | ICD-10-CM | POA: Diagnosis not present

## 2022-04-15 DIAGNOSIS — M25571 Pain in right ankle and joints of right foot: Secondary | ICD-10-CM | POA: Diagnosis not present

## 2022-04-15 DIAGNOSIS — S93401A Sprain of unspecified ligament of right ankle, initial encounter: Secondary | ICD-10-CM | POA: Diagnosis not present

## 2022-04-16 DIAGNOSIS — Z3200 Encounter for pregnancy test, result unknown: Secondary | ICD-10-CM | POA: Diagnosis not present

## 2022-04-16 DIAGNOSIS — F5101 Primary insomnia: Secondary | ICD-10-CM | POA: Diagnosis not present

## 2022-04-16 DIAGNOSIS — G8929 Other chronic pain: Secondary | ICD-10-CM | POA: Diagnosis not present

## 2022-04-16 DIAGNOSIS — F419 Anxiety disorder, unspecified: Secondary | ICD-10-CM | POA: Diagnosis not present

## 2022-04-16 DIAGNOSIS — M792 Neuralgia and neuritis, unspecified: Secondary | ICD-10-CM | POA: Diagnosis not present

## 2022-04-16 DIAGNOSIS — G8921 Chronic pain due to trauma: Secondary | ICD-10-CM | POA: Diagnosis not present

## 2022-04-16 DIAGNOSIS — Z6839 Body mass index (BMI) 39.0-39.9, adult: Secondary | ICD-10-CM | POA: Diagnosis not present

## 2022-04-16 DIAGNOSIS — Z79899 Other long term (current) drug therapy: Secondary | ICD-10-CM | POA: Diagnosis not present

## 2022-04-16 DIAGNOSIS — E669 Obesity, unspecified: Secondary | ICD-10-CM | POA: Diagnosis not present

## 2022-04-16 DIAGNOSIS — Z0130 Encounter for examination of blood pressure without abnormal findings: Secondary | ICD-10-CM | POA: Diagnosis not present

## 2022-04-16 DIAGNOSIS — F32A Depression, unspecified: Secondary | ICD-10-CM | POA: Diagnosis not present

## 2022-04-29 DIAGNOSIS — S34109D Unspecified injury to unspecified level of lumbar spinal cord, subsequent encounter: Secondary | ICD-10-CM | POA: Diagnosis not present

## 2022-04-30 ENCOUNTER — Other Ambulatory Visit: Payer: Self-pay | Admitting: Family Medicine

## 2022-04-30 DIAGNOSIS — F339 Major depressive disorder, recurrent, unspecified: Secondary | ICD-10-CM

## 2022-05-01 DIAGNOSIS — Z419 Encounter for procedure for purposes other than remedying health state, unspecified: Secondary | ICD-10-CM | POA: Diagnosis not present

## 2022-05-06 ENCOUNTER — Ambulatory Visit (INDEPENDENT_AMBULATORY_CARE_PROVIDER_SITE_OTHER): Payer: Medicaid Other | Admitting: Clinical

## 2022-05-06 DIAGNOSIS — F4310 Post-traumatic stress disorder, unspecified: Secondary | ICD-10-CM

## 2022-05-06 DIAGNOSIS — F419 Anxiety disorder, unspecified: Secondary | ICD-10-CM | POA: Diagnosis not present

## 2022-05-06 DIAGNOSIS — F331 Major depressive disorder, recurrent, moderate: Secondary | ICD-10-CM | POA: Diagnosis not present

## 2022-05-06 NOTE — Progress Notes (Signed)
Virtual Visit via Telephone Note  I connected with Angela [name] on 05/06/22 at  2:00 PM EST by telephone and verified that I am speaking with the correct person using two identifiers.  Location: Patient: Home Provider: Office   I discussed the limitations, risks, security and privacy concerns of performing an evaluation and management service by telephone and the availability of in person appointments. I also discussed with the patient that there may be a patient responsible charge related to this service. The patient expressed understanding and agreed to proceed.  THERAPIST PROGRESS NOTE   Session Time: 2:00 PM-2:30 PM   Participation Level: Active   Behavioral Response: CasualAlertDepressed   Type of Therapy: Individual Therapy   Treatment Goals addressed: Coping   Interventions: CBT, Motivational Interviewing, Strength-based and Supportive   Summary: Angela [name] is a 21 y.o. female who presents with Depression with Anxiety and PTSD.The OPT therapist worked with the patient for her ongoing outpatient. OPT treatment. The OPT therapist utilized Motivational Interviewing to assist in creating therapeutic repore. The patient in the session was engaged and work in collaboration giving feedback about her triggers and symptoms over the past few weeks. The patient spoke about the impact of the holidays coming up without her Mother who passed away just over a year ago.The patient spoke about staying over the last few days with a friend in [place] and planning to return home. The patient spoke about going out with her family for the recent Halloween holiday and plans for the upcoming Thanksgiving and Christmas holidays.The OPT therapist utilized Cognitive Behavioral Therapy through cognitive restructuring as well as worked with the patient on self awareness and implementing coping strategies to assist in management of current stressors.The patient was encouraged to continue work towards  her goals including getting license and continuing her education.The patient spoke about agreement to call [employer] for more information about the Spring 2024 semester.The OPT therapist provided ongoing psychoeducation and support throughout the session with the patient.     Suicidal/Homicidal: Nowithout intent/plan   Therapist Response: The OPT therapist worked with the patient for the patients scheduled session. The patient was engaged in her session and gave feedback in relation to triggers, symptoms, and behavior responses over the past few weeks..The OPT therapist worked with the patient utilizing an in session Cognitive Behavioral Therapy exercise. The patient was responsive in the session and verbalized, " I am going to be getting Thanksgiving together thankfully this year some family will be also helping me cook". The OPT therapist gauged the patients mood baseline over the past few weeks. The patient verbalized trying to manage her feelings around the loss of her Mother and the feeling of the holidays being different without her.The OPT therapist will continue treatment work with the patient in her next scheduled session.   Plan: Return again in 2/3 weeks.   Diagnosis:      Axis I: Recurrent Moderate Depression with anxiety and PTSD (post-traumatic stress disorder)                           Axis II: No diagnosis       Collaboration of Care: No additional Collaboration for this session.   Patient/Guardian was advised Release of Information must be obtained prior to any record release in order to collaborate their care with an outside provider. Patient/Guardian was advised if they have not already done so to contact the registration department to sign  all necessary forms in order for [place] to release information regarding their care.    Consent: Patient/Guardian gives verbal consent for treatment and assignment of benefits for services provided during this visit. Patient/Guardian  expressed understanding and agreed to proceed.    I discussed the assessment and treatment plan with the patient. The patient was provided an opportunity to ask questions and all were answered. The patient agreed with the plan and demonstrated an understanding of the instructions.   The patient was advised to call back or seek an in-person evaluation if the symptoms worsen or if the condition fails to improve as anticipated.   I provided 30 minutes of non-face-to-face time during this encounter.     Lennox Grumbles, LCSW   05/06/2022

## 2022-05-13 DIAGNOSIS — F419 Anxiety disorder, unspecified: Secondary | ICD-10-CM | POA: Diagnosis not present

## 2022-05-13 DIAGNOSIS — F32A Depression, unspecified: Secondary | ICD-10-CM | POA: Diagnosis not present

## 2022-05-13 DIAGNOSIS — Z3200 Encounter for pregnancy test, result unknown: Secondary | ICD-10-CM | POA: Diagnosis not present

## 2022-05-13 DIAGNOSIS — M549 Dorsalgia, unspecified: Secondary | ICD-10-CM | POA: Diagnosis not present

## 2022-05-13 DIAGNOSIS — M792 Neuralgia and neuritis, unspecified: Secondary | ICD-10-CM | POA: Diagnosis not present

## 2022-05-13 DIAGNOSIS — Z79899 Other long term (current) drug therapy: Secondary | ICD-10-CM | POA: Diagnosis not present

## 2022-05-13 DIAGNOSIS — Z0130 Encounter for examination of blood pressure without abnormal findings: Secondary | ICD-10-CM | POA: Diagnosis not present

## 2022-05-13 DIAGNOSIS — Z6839 Body mass index (BMI) 39.0-39.9, adult: Secondary | ICD-10-CM | POA: Diagnosis not present

## 2022-05-13 DIAGNOSIS — G8921 Chronic pain due to trauma: Secondary | ICD-10-CM | POA: Diagnosis not present

## 2022-05-13 DIAGNOSIS — E669 Obesity, unspecified: Secondary | ICD-10-CM | POA: Diagnosis not present

## 2022-05-14 ENCOUNTER — Ambulatory Visit (HOSPITAL_COMMUNITY): Payer: Medicaid Other | Attending: Student | Admitting: Physical Therapy

## 2022-05-14 ENCOUNTER — Encounter (HOSPITAL_COMMUNITY): Payer: Self-pay | Admitting: Physical Therapy

## 2022-05-14 DIAGNOSIS — R2689 Other abnormalities of gait and mobility: Secondary | ICD-10-CM | POA: Insufficient documentation

## 2022-05-14 DIAGNOSIS — R262 Difficulty in walking, not elsewhere classified: Secondary | ICD-10-CM | POA: Diagnosis not present

## 2022-05-14 DIAGNOSIS — Z9181 History of falling: Secondary | ICD-10-CM | POA: Diagnosis not present

## 2022-05-14 DIAGNOSIS — M6281 Muscle weakness (generalized): Secondary | ICD-10-CM | POA: Diagnosis not present

## 2022-05-14 DIAGNOSIS — M25571 Pain in right ankle and joints of right foot: Secondary | ICD-10-CM | POA: Insufficient documentation
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
## 2022-05-20 ENCOUNTER — Ambulatory Visit (HOSPITAL_COMMUNITY): Payer: Medicaid Other | Admitting: Physical Therapy

## 2022-05-20 DIAGNOSIS — M6281 Muscle weakness (generalized): Secondary | ICD-10-CM | POA: Diagnosis not present

## 2022-05-20 DIAGNOSIS — Z9181 History of falling: Secondary | ICD-10-CM | POA: Diagnosis not present

## 2022-05-20 DIAGNOSIS — R262 Difficulty in walking, not elsewhere classified: Secondary | ICD-10-CM | POA: Diagnosis not present

## 2022-05-20 DIAGNOSIS — M25571 Pain in right ankle and joints of right foot: Secondary | ICD-10-CM

## 2022-05-20 DIAGNOSIS — R2689 Other abnormalities of gait and mobility: Secondary | ICD-10-CM | POA: Diagnosis not present
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
## 2022-05-22 ENCOUNTER — Ambulatory Visit (HOSPITAL_COMMUNITY): Payer: Medicaid Other

## 2022-05-22 ENCOUNTER — Encounter (HOSPITAL_COMMUNITY): Payer: Self-pay

## 2022-05-22 DIAGNOSIS — R2689 Other abnormalities of gait and mobility: Secondary | ICD-10-CM

## 2022-05-22 DIAGNOSIS — M6281 Muscle weakness (generalized): Secondary | ICD-10-CM

## 2022-05-22 DIAGNOSIS — Z9181 History of falling: Secondary | ICD-10-CM

## 2022-05-22 DIAGNOSIS — M25571 Pain in right ankle and joints of right foot: Secondary | ICD-10-CM | POA: Diagnosis not present

## 2022-05-22 DIAGNOSIS — R262 Difficulty in walking, not elsewhere classified: Secondary | ICD-10-CM | POA: Diagnosis not present
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
## 2022-05-27 ENCOUNTER — Encounter (HOSPITAL_COMMUNITY): Payer: Medicaid Other | Admitting: Physical Therapy

## 2022-05-30 ENCOUNTER — Ambulatory Visit (HOSPITAL_COMMUNITY): Payer: Medicaid Other | Admitting: Physical Therapy

## 2022-05-30 DIAGNOSIS — M6281 Muscle weakness (generalized): Secondary | ICD-10-CM

## 2022-05-30 DIAGNOSIS — R2689 Other abnormalities of gait and mobility: Secondary | ICD-10-CM | POA: Diagnosis not present

## 2022-05-30 DIAGNOSIS — R262 Difficulty in walking, not elsewhere classified: Secondary | ICD-10-CM

## 2022-05-30 DIAGNOSIS — M25571 Pain in right ankle and joints of right foot: Secondary | ICD-10-CM | POA: Diagnosis not present

## 2022-05-30 DIAGNOSIS — Z9181 History of falling: Secondary | ICD-10-CM
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
## 2022-05-31 DIAGNOSIS — Z419 Encounter for procedure for purposes other than remedying health state, unspecified: Secondary | ICD-10-CM | POA: Diagnosis not present

## 2022-06-04 ENCOUNTER — Ambulatory Visit (HOSPITAL_COMMUNITY): Payer: Medicaid Other | Attending: Student | Admitting: Physical Therapy

## 2022-06-04 DIAGNOSIS — M25571 Pain in right ankle and joints of right foot: Secondary | ICD-10-CM | POA: Diagnosis not present

## 2022-06-04 DIAGNOSIS — R262 Difficulty in walking, not elsewhere classified: Secondary | ICD-10-CM | POA: Diagnosis not present

## 2022-06-04 DIAGNOSIS — Z9181 History of falling: Secondary | ICD-10-CM | POA: Insufficient documentation

## 2022-06-04 DIAGNOSIS — M6281 Muscle weakness (generalized): Secondary | ICD-10-CM | POA: Diagnosis not present
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
## 2022-06-05 ENCOUNTER — Encounter: Payer: Medicaid Other | Admitting: Family Medicine

## 2022-06-06 ENCOUNTER — Ambulatory Visit (HOSPITAL_COMMUNITY): Payer: Medicaid Other | Attending: Family Medicine | Admitting: Physical Therapy

## 2022-06-06 DIAGNOSIS — R262 Difficulty in walking, not elsewhere classified: Secondary | ICD-10-CM | POA: Insufficient documentation

## 2022-06-06 DIAGNOSIS — M25571 Pain in right ankle and joints of right foot: Secondary | ICD-10-CM | POA: Diagnosis not present

## 2022-06-06 DIAGNOSIS — Z9181 History of falling: Secondary | ICD-10-CM | POA: Insufficient documentation

## 2022-06-06 DIAGNOSIS — M6281 Muscle weakness (generalized): Secondary | ICD-10-CM | POA: Diagnosis not present
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
## 2022-06-10 ENCOUNTER — Ambulatory Visit (INDEPENDENT_AMBULATORY_CARE_PROVIDER_SITE_OTHER): Payer: Medicaid Other | Admitting: Clinical

## 2022-06-10 DIAGNOSIS — F331 Major depressive disorder, recurrent, moderate: Secondary | ICD-10-CM | POA: Diagnosis not present

## 2022-06-10 DIAGNOSIS — F419 Anxiety disorder, unspecified: Secondary | ICD-10-CM

## 2022-06-10 DIAGNOSIS — F4310 Post-traumatic stress disorder, unspecified: Secondary | ICD-10-CM | POA: Diagnosis not present

## 2022-06-10 NOTE — Progress Notes (Signed)
Virtual Visit via Telephone Note   I connected with Angela [name] on 06/10/22 at  3:00 PM EST by telephone and verified that I am speaking with the correct person using two identifiers.   Location: Patient: Home Provider: Office   I discussed the limitations, risks, security and privacy concerns of performing an evaluation and management service by telephone and the availability of in person appointments. I also discussed with the patient that there may be a patient responsible charge related to this service. The patient expressed understanding and agreed to proceed.   THERAPIST PROGRESS NOTE   Session Time: 3:00 PM-3:30 PM   Participation Level: Active   Behavioral Response: CasualAlertDepressed   Type of Therapy: Individual Therapy   Treatment Goals addressed: Coping   Interventions: CBT, Motivational Interviewing, Strength-based and Supportive   Summary: Angela [name] is a 21 y.o. female who presents with Depression with Anxiety and PTSD.The OPT therapist worked with the patient for her ongoing outpatient. OPT treatment. The OPT therapist utilized Motivational Interviewing to assist in creating therapeutic repore. The patient in the session was engaged and work in collaboration giving feedback about her triggers and symptoms over the past few weeks. The patient spoke about the impact of the holidays coming up without her Mother who passed away just over a year ago.The patient spoke about plans for the upcoming weekend going camping with a friend and her brother. The patient spoke about her progress recently feeling better over all from doing PT 2x per week for a session, which she will have until mid February.The OPT therapist utilized Cognitive Behavioral Therapy through cognitive restructuring as well as worked with the patient on self awareness and implementing coping strategies to assist in management of current stressors.The patient was encouraged to continue work towards  her goals including getting license and continuing her education.The OPT therapist provided ongoing psychoeducation and support throughout the session with the patient. The OPT therapist worked with the patient overviewing upcoming appointments as listed in the patients MyChart.   Suicidal/Homicidal: Nowithout intent/plan   Therapist Response: The OPT therapist worked with the patient for the patients scheduled session. The patient was engaged in her session and gave feedback in relation to triggers, symptoms, and behavior responses over the past few weeks.The OPT therapist worked with the patient utilizing an in session Cognitive Behavioral Therapy exercise. The patient was responsive in the session and verbalized, " I have been feeling a lot better with doing the physical therapy it helps a lot with the exercise and it gets me out of the house I have PT 2x per week". The OPT therapist gauged the patients mood baseline over the past few weeks. The patient verbalized trying to manage her feelings around the loss of her Mother and the feeling that impact more with the upcoming Christmas holiday.The OPT therapist worked with the patient encouraging ongoing consistency with her PT and all of her scheduled health appointments, doing her self check-ins, and continuing to implement her coping strategies. The OPT therapist will continue treatment work with the patient in her next scheduled session.   Plan: Return again in 2/3 weeks.   Diagnosis:      Axis I: Recurrent Moderate Depression with anxiety and PTSD (post-traumatic stress disorder)                           Axis II: No diagnosis       Collaboration of Care: No additional  Collaboration for this session.   Patient/Guardian was advised Release of Information must be obtained prior to any record release in order to collaborate their care with an outside provider. Patient/Guardian was advised if they have not already done so to contact the registration  department to sign all necessary forms in order for [place] to release information regarding their care.    Consent: Patient/Guardian gives verbal consent for treatment and assignment of benefits for services provided during this visit. Patient/Guardian expressed understanding and agreed to proceed.    I discussed the assessment and treatment plan with the patient. The patient was provided an opportunity to ask questions and all were answered. The patient agreed with the plan and demonstrated an understanding of the instructions.   The patient was advised to call back or seek an in-person evaluation if the symptoms worsen or if the condition fails to improve as anticipated.   I provided 30 minutes of non-face-to-face time during this encounter.     Winfred Burn, LCSW   06/10/2022

## 2022-06-11 ENCOUNTER — Encounter (HOSPITAL_COMMUNITY): Payer: Medicaid Other | Admitting: Physical Therapy

## 2022-06-12 ENCOUNTER — Encounter: Payer: Medicaid Other | Admitting: Family Medicine

## 2022-06-13 ENCOUNTER — Encounter (HOSPITAL_COMMUNITY): Payer: Medicaid Other | Admitting: Physical Therapy

## 2022-06-13 DIAGNOSIS — Z0130 Encounter for examination of blood pressure without abnormal findings: Secondary | ICD-10-CM | POA: Diagnosis not present

## 2022-06-13 DIAGNOSIS — M62838 Other muscle spasm: Secondary | ICD-10-CM | POA: Diagnosis not present

## 2022-06-13 DIAGNOSIS — F32A Depression, unspecified: Secondary | ICD-10-CM | POA: Diagnosis not present

## 2022-06-13 DIAGNOSIS — S93401A Sprain of unspecified ligament of right ankle, initial encounter: Secondary | ICD-10-CM | POA: Diagnosis not present

## 2022-06-13 DIAGNOSIS — Z3200 Encounter for pregnancy test, result unknown: Secondary | ICD-10-CM | POA: Diagnosis not present

## 2022-06-13 DIAGNOSIS — S34109S Unspecified injury to unspecified level of lumbar spinal cord, sequela: Secondary | ICD-10-CM | POA: Diagnosis not present

## 2022-06-13 DIAGNOSIS — Z6839 Body mass index (BMI) 39.0-39.9, adult: Secondary | ICD-10-CM | POA: Diagnosis not present

## 2022-06-13 DIAGNOSIS — M792 Neuralgia and neuritis, unspecified: Secondary | ICD-10-CM | POA: Diagnosis not present

## 2022-06-13 DIAGNOSIS — Z79899 Other long term (current) drug therapy: Secondary | ICD-10-CM | POA: Diagnosis not present

## 2022-06-13 DIAGNOSIS — M549 Dorsalgia, unspecified: Secondary | ICD-10-CM | POA: Diagnosis not present

## 2022-06-13 DIAGNOSIS — F419 Anxiety disorder, unspecified: Secondary | ICD-10-CM | POA: Diagnosis not present

## 2022-06-13 DIAGNOSIS — M21372 Foot drop, left foot: Secondary | ICD-10-CM | POA: Diagnosis not present

## 2022-06-14 ENCOUNTER — Encounter: Payer: Medicaid Other | Admitting: Family Medicine
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
## 2022-06-23 ENCOUNTER — Other Ambulatory Visit: Payer: Self-pay

## 2022-06-23 ENCOUNTER — Encounter (HOSPITAL_COMMUNITY): Payer: Self-pay | Admitting: Emergency Medicine

## 2022-06-23 ENCOUNTER — Emergency Department (HOSPITAL_COMMUNITY)
Admission: EM | Admit: 2022-06-23 | Discharge: 2022-06-23 | Disposition: A | Discharge status: Home / Self Care | Payer: Medicaid Other | Attending: Emergency Medicine | Admitting: Emergency Medicine

## 2022-06-23 DIAGNOSIS — N939 Abnormal uterine and vaginal bleeding, unspecified: Secondary | ICD-10-CM

## 2022-06-23 DIAGNOSIS — R000 Tachycardia, unspecified: Secondary | ICD-10-CM | POA: Insufficient documentation

## 2022-06-23 DIAGNOSIS — R1030 Lower abdominal pain, unspecified: Secondary | ICD-10-CM | POA: Insufficient documentation

## 2022-06-23 DIAGNOSIS — Z91040 Latex allergy status: Secondary | ICD-10-CM | POA: Diagnosis not present

## 2022-06-23 DIAGNOSIS — M545 Low back pain, unspecified: Secondary | ICD-10-CM | POA: Diagnosis not present

## 2022-06-23 LAB — URINALYSIS, ROUTINE W REFLEX MICROSCOPIC
Bacteria, UA: NONE SEEN
Bilirubin Urine: NEGATIVE
Glucose, UA: NEGATIVE mg/dL
Ketones, ur: NEGATIVE mg/dL
Leukocytes,Ua: NEGATIVE
Nitrite: NEGATIVE
Protein, ur: 30 mg/dL — AB
RBC / HPF: >50 RBC/hpf — ABNORMAL HIGH (ref 0–5)
Specific Gravity, Urine: 1.014 (ref 1.005–1.030)
pH: 7 (ref 5.0–8.0)

## 2022-06-23 LAB — CBC
HCT: 39.7 % (ref 36.0–46.0)
Hemoglobin: 13.4 g/dL (ref 12.0–15.0)
MCH: 29.6 pg (ref 26.0–34.0)
MCHC: 33.8 g/dL (ref 30.0–36.0)
MCV: 87.6 fL (ref 80.0–100.0)
Platelets: 305 10*3/uL (ref 150–400)
RBC: 4.53 MIL/uL (ref 3.87–5.11)
RDW: 13.1 % (ref 11.5–15.5)
WBC: 8.1 10*3/uL (ref 4.0–10.5)
nRBC: 0 % (ref 0.0–0.2)

## 2022-06-23 LAB — POC URINE PREG, ED: Preg Test, Ur: NEGATIVE

## 2022-06-23 MED ORDER — IBUPROFEN 800 MG PO TABS: 800.0000 mg | ORAL_TABLET | Freq: Three times a day (TID) | ORAL | 0 refills | Status: DC | PRN

## 2022-06-23 MED ORDER — KETOROLAC TROMETHAMINE 30 MG/ML IJ SOLN
60.0000 mg | Freq: Once | INTRAMUSCULAR | Status: AC
  Administered 2022-06-23: 60 mg via INTRAMUSCULAR
  Filled 2022-06-23: qty 2

## 2022-06-23 NOTE — Discharge Instructions (Signed)
You may apply warm heat on and off to help with cramping.  Take the ibuprofen as directed.  Please follow-up with your primary care provider or with your OB/GYN if needed.  Return to the emergency department for any new or worsening symptoms.
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
## 2022-06-23 NOTE — ED Triage Notes (Signed)
Pt presents with vaginal bleeding with clots that started this am, also having lower back and abdominal pain, last period 05/26/22.

## 2022-06-25 ENCOUNTER — Other Ambulatory Visit: Payer: Self-pay | Admitting: Family Medicine

## 2022-06-25 DIAGNOSIS — Z23 Encounter for immunization: Secondary | ICD-10-CM

## 2022-06-25 DIAGNOSIS — F339 Major depressive disorder, recurrent, unspecified: Secondary | ICD-10-CM

## 2022-06-25 DIAGNOSIS — M792 Neuralgia and neuritis, unspecified: Secondary | ICD-10-CM

## 2022-06-25 DIAGNOSIS — F419 Anxiety disorder, unspecified: Secondary | ICD-10-CM

## 2022-06-25 DIAGNOSIS — G839 Paralytic syndrome, unspecified: Secondary | ICD-10-CM

## 2022-06-25 DIAGNOSIS — E559 Vitamin D deficiency, unspecified: Secondary | ICD-10-CM

## 2022-06-25 DIAGNOSIS — G8220 Paraplegia, unspecified: Secondary | ICD-10-CM

## 2022-06-25 DIAGNOSIS — M62838 Other muscle spasm: Secondary | ICD-10-CM

## 2022-06-25 DIAGNOSIS — S34109S Unspecified injury to unspecified level of lumbar spinal cord, sequela: Secondary | ICD-10-CM

## 2022-06-25 DIAGNOSIS — Z1159 Encounter for screening for other viral diseases: Secondary | ICD-10-CM

## 2022-06-25 DIAGNOSIS — F32A Depression, unspecified: Secondary | ICD-10-CM
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
## 2022-06-27 ENCOUNTER — Encounter: Payer: Medicaid Other | Admitting: Family Medicine

## 2022-07-01 DIAGNOSIS — Z419 Encounter for procedure for purposes other than remedying health state, unspecified: Secondary | ICD-10-CM | POA: Diagnosis not present

## 2022-07-01 NOTE — L&D Delivery Note (Signed)
Delivery Note   Angela [name] is a 22 y.o. G1P1001 at [redacted]w[redacted]d Estimated Date of Delivery: 05/12/23  PRE-OPERATIVE DIAGNOSIS:  1) [redacted]w[redacted]d pregnancy.    POST-OPERATIVE DIAGNOSIS:  1) [redacted]w[redacted]d pregnancy s/p Vaginal, Spontaneous 2) Labial laceration, repaired  Delivery Type: Vaginal, Spontaneous   Delivery Anesthesia: None  Labor Complications: None    ESTIMATED BLOOD LOSS: 350 ml    FINDINGS:   1) Female infant, Apgar scores of 8   at 1 minute and 9   at 5 minutes. Birthweight pending.  SPECIMENS:   PLACENTA:   Appearance: Intact   Removal: Spontaneous     Disposition: Per protocol  CORD BLOOD: Collected  DISPOSITION:  Infant left in stable condition in the delivery room, with L&D personnel and mother.  NARRATIVE SUMMARY: Labor course:  Angela [name] is a G1P1001 at [redacted]w[redacted]d who presented to Labor & Delivery for induction of labor. Her initial cervical exam was 3.5/60/-2. Labor proceeded with augmentation, and she was found to be completely dilated at 0904. With excellent maternal pushing effort, she birthed a viable female infant at 1000. There was not a nuchal cord. The shoulders were birthed without difficulty. The infant was placed skin-to-skin with [name]. The cord was doubly clamped and cut by the father when pulsations ceased. The placenta delivered spontaneously and was noted to be intact with a 3VC. A perineal and vaginal examination was performed. Episiotomy/Lacerations: Labial Lacerations were repaired with Vicryl suture using local anesthesia. [name] tolerated this well. Mother and baby were left in stable condition.   [name], CNM 05/09/2023 12:03 PM

## 2022-07-03 ENCOUNTER — Ambulatory Visit (HOSPITAL_COMMUNITY): Payer: Medicaid Other | Attending: Student | Admitting: Physical Therapy

## 2022-07-03 ENCOUNTER — Encounter (HOSPITAL_COMMUNITY): Payer: Self-pay | Admitting: Physical Therapy

## 2022-07-03 DIAGNOSIS — M6281 Muscle weakness (generalized): Secondary | ICD-10-CM | POA: Diagnosis not present

## 2022-07-03 DIAGNOSIS — R262 Difficulty in walking, not elsewhere classified: Secondary | ICD-10-CM

## 2022-07-03 DIAGNOSIS — M25571 Pain in right ankle and joints of right foot: Secondary | ICD-10-CM | POA: Diagnosis not present

## 2022-07-03 DIAGNOSIS — Z9181 History of falling: Secondary | ICD-10-CM | POA: Diagnosis not present

## 2022-07-03 DIAGNOSIS — R2689 Other abnormalities of gait and mobility: Secondary | ICD-10-CM | POA: Diagnosis not present
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
## 2022-07-04 ENCOUNTER — Ambulatory Visit (INDEPENDENT_AMBULATORY_CARE_PROVIDER_SITE_OTHER): Payer: Medicaid Other | Admitting: Family Medicine

## 2022-07-04 ENCOUNTER — Encounter: Payer: Self-pay | Admitting: Family Medicine

## 2022-07-04 VITALS — BP 120/81 | HR 98 | Temp 98.0°F | Resp 16 | Ht 66.5 in | Wt 240.0 lb

## 2022-07-04 DIAGNOSIS — R748 Abnormal levels of other serum enzymes: Secondary | ICD-10-CM | POA: Diagnosis not present

## 2022-07-04 DIAGNOSIS — E559 Vitamin D deficiency, unspecified: Secondary | ICD-10-CM | POA: Diagnosis not present

## 2022-07-04 DIAGNOSIS — Z833 Family history of diabetes mellitus: Secondary | ICD-10-CM | POA: Diagnosis not present

## 2022-07-04 DIAGNOSIS — Z23 Encounter for immunization: Secondary | ICD-10-CM

## 2022-07-04 DIAGNOSIS — F339 Major depressive disorder, recurrent, unspecified: Secondary | ICD-10-CM | POA: Diagnosis not present

## 2022-07-04 DIAGNOSIS — G8220 Paraplegia, unspecified: Secondary | ICD-10-CM

## 2022-07-04 DIAGNOSIS — F419 Anxiety disorder, unspecified: Secondary | ICD-10-CM | POA: Diagnosis not present

## 2022-07-04 DIAGNOSIS — S34109S Unspecified injury to unspecified level of lumbar spinal cord, sequela: Secondary | ICD-10-CM | POA: Diagnosis not present

## 2022-07-04 DIAGNOSIS — E7800 Pure hypercholesterolemia, unspecified: Secondary | ICD-10-CM | POA: Diagnosis not present

## 2022-07-04 DIAGNOSIS — G839 Paralytic syndrome, unspecified: Secondary | ICD-10-CM | POA: Diagnosis not present

## 2022-07-04 DIAGNOSIS — Z0000 Encounter for general adult medical examination without abnormal findings: Secondary | ICD-10-CM | POA: Diagnosis not present

## 2022-07-04 DIAGNOSIS — M792 Neuralgia and neuritis, unspecified: Secondary | ICD-10-CM | POA: Diagnosis not present

## 2022-07-04 MED ORDER — ESCITALOPRAM OXALATE 20 MG PO TABS: 20.0000 mg | ORAL_TABLET | Freq: Every day | ORAL | 3 refills | Status: DC

## 2022-07-04 MED ORDER — BACLOFEN 10 MG PO TABS: ORAL_TABLET | ORAL | 3 refills | Status: DC

## 2022-07-04 MED ORDER — AMITRIPTYLINE HCL 50 MG PO TABS: 50.0000 mg | ORAL_TABLET | Freq: Every day | ORAL | 3 refills | Status: DC

## 2022-07-04 NOTE — Assessment & Plan Note (Signed)
Chronic, previously stable Has stopped supplements Repeat given obesity and depression

## 2022-07-04 NOTE — Assessment & Plan Note (Signed)
Body mass index is 38.16 kg/m. Associated with depression, hyperlipidemia and chronic pain

## 2022-07-04 NOTE — Assessment & Plan Note (Signed)
Chronic, repeat labs recommend diet low in saturated fat and regular exercise - 30 min at least 5 times per week

## 2022-07-04 NOTE — Assessment & Plan Note (Signed)
Due for dental and vision Wishes to "trial off" OCPs given chronic use; complicated by recent extended cycle and visit to ER with s/s Things to do to keep yourself healthy  - Exercise at least 30-45 minutes a day, 3-4 days a week.  - Eat a low-fat diet with lots of fruits and vegetables, up to 7-9 servings per day.  - Seatbelts can save your life. Wear them always.  - Smoke detectors on every level of your home, check batteries every year.  - Eye Doctor - have an eye exam every 1-2 years  - Safe sex - if you may be exposed to STDs, use a condom.  - Alcohol -  If you drink, do it moderately, less than 2 drinks per day.  - [place]. Choose someone to speak for you if you are not able.  - Depression is common in our stressful world.If you're feeling down or losing interest in things you normally enjoy, please come in for a visit.  - Violence - If anyone is threatening or hurting you, please call immediately.

## 2022-07-04 NOTE — Assessment & Plan Note (Addendum)
BLE s/p injury Chronic, stable Complex medical management with use of opioids, baclofen, gabapentin, TCAs

## 2022-07-04 NOTE — Assessment & Plan Note (Signed)
Chronic, stable Complex medical management with use of opioids, baclofen, gabapentin, TCAs Lower body with use of w/c and slide boards

## 2022-07-04 NOTE — Assessment & Plan Note (Signed)
Both parents; Continue to recommend balanced, lower carb meals. Smaller meal size, adding snacks. Choosing water as drink of choice and increasing purposeful exercise. Check A1c
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
## 2022-07-04 NOTE — Assessment & Plan Note (Signed)
Chronic, stable Complex medical management with use of opioids, baclofen, gabapentin, TCAs

## 2022-07-05 ENCOUNTER — Other Ambulatory Visit: Payer: Self-pay

## 2022-07-05 DIAGNOSIS — E7800 Pure hypercholesterolemia, unspecified: Secondary | ICD-10-CM

## 2022-07-05 LAB — CBC
Hematocrit: 39.6 % (ref 34.0–46.6)
Hemoglobin: 13.2 g/dL (ref 11.1–15.9)
MCH: 29.4 pg (ref 26.6–33.0)
MCHC: 33.3 g/dL (ref 31.5–35.7)
MCV: 88 fL (ref 79–97)
Platelets: 335 10*3/uL (ref 150–450)
RBC: 4.49 x10E6/uL (ref 3.77–5.28)
RDW: 12.8 % (ref 11.7–15.4)
WBC: 10.6 10*3/uL (ref 3.4–10.8)

## 2022-07-05 LAB — COMPREHENSIVE METABOLIC PANEL
ALT: 42 IU/L — ABNORMAL HIGH (ref 0–32)
AST: 27 IU/L (ref 0–40)
Albumin/Globulin Ratio: 1.9 (ref 1.2–2.2)
Albumin: 4.1 g/dL (ref 4.0–5.0)
Alkaline Phosphatase: 111 IU/L (ref 44–121)
BUN/Creatinine Ratio: 14 (ref 9–23)
BUN: 9 mg/dL (ref 6–20)
Bilirubin Total: 0.2 mg/dL (ref 0.0–1.2)
CO2: 21 mmol/L (ref 20–29)
Calcium: 9.4 mg/dL (ref 8.7–10.2)
Chloride: 100 mmol/L (ref 96–106)
Creatinine, Ser: 0.63 mg/dL (ref 0.57–1.00)
Globulin, Total: 2.2 g/dL (ref 1.5–4.5)
Glucose: 71 mg/dL (ref 70–99)
Potassium: 4.2 mmol/L (ref 3.5–5.2)
Sodium: 139 mmol/L (ref 134–144)
Total Protein: 6.3 g/dL (ref 6.0–8.5)
eGFR: 128 mL/min/{1.73_m2} (ref >59)

## 2022-07-05 LAB — LIPID PANEL
Chol/HDL Ratio: 6.3 ratio — ABNORMAL HIGH (ref 0.0–4.4)
Cholesterol, Total: 233 mg/dL — ABNORMAL HIGH (ref 100–199)
HDL: 37 mg/dL — ABNORMAL LOW (ref >39)
LDL Chol Calc (NIH): 150 mg/dL — ABNORMAL HIGH (ref 0–99)
Triglycerides: 251 mg/dL — ABNORMAL HIGH (ref 0–149)
VLDL Cholesterol Cal: 46 mg/dL — ABNORMAL HIGH (ref 5–40)

## 2022-07-05 LAB — HEMOGLOBIN A1C
Est. average glucose Bld gHb Est-mCnc: 105 mg/dL
Hgb A1c MFr Bld: 5.3 % (ref 4.8–5.6)

## 2022-07-05 LAB — VITAMIN D 25 HYDROXY (VIT D DEFICIENCY, FRACTURES): Vit D, 25-Hydroxy: 25.3 ng/mL — ABNORMAL LOW (ref 30.0–100.0)

## 2022-07-05 MED ORDER — ROSUVASTATIN CALCIUM 20 MG PO TABS: 20.0000 mg | ORAL_TABLET | Freq: Every day | ORAL | 1 refills | Status: DC

## 2022-07-05 NOTE — Progress Notes (Signed)
Cholesterol is elevated from last year; continue to recommend heart healthy diet and modified exercise to assist. If desired, can start cholesterol medication- a "statin"   Vit D remains low; recommend 5000 IU/day with fat containing meal. Available over the counter.  Other labs are normal and stable.

## 2022-07-08 DIAGNOSIS — M549 Dorsalgia, unspecified: Secondary | ICD-10-CM | POA: Diagnosis not present

## 2022-07-08 DIAGNOSIS — S34109S Unspecified injury to unspecified level of lumbar spinal cord, sequela: Secondary | ICD-10-CM | POA: Diagnosis not present

## 2022-07-08 DIAGNOSIS — Z3200 Encounter for pregnancy test, result unknown: Secondary | ICD-10-CM | POA: Diagnosis not present

## 2022-07-08 DIAGNOSIS — F419 Anxiety disorder, unspecified: Secondary | ICD-10-CM | POA: Diagnosis not present

## 2022-07-08 DIAGNOSIS — G8921 Chronic pain due to trauma: Secondary | ICD-10-CM | POA: Diagnosis not present

## 2022-07-08 DIAGNOSIS — Z0130 Encounter for examination of blood pressure without abnormal findings: Secondary | ICD-10-CM | POA: Diagnosis not present

## 2022-07-08 DIAGNOSIS — M792 Neuralgia and neuritis, unspecified: Secondary | ICD-10-CM | POA: Diagnosis not present

## 2022-07-08 DIAGNOSIS — Z79899 Other long term (current) drug therapy: Secondary | ICD-10-CM | POA: Diagnosis not present

## 2022-07-08 DIAGNOSIS — M62838 Other muscle spasm: Secondary | ICD-10-CM | POA: Diagnosis not present

## 2022-07-08 DIAGNOSIS — Z6839 Body mass index (BMI) 39.0-39.9, adult: Secondary | ICD-10-CM | POA: Diagnosis not present

## 2022-07-09 ENCOUNTER — Ambulatory Visit (HOSPITAL_COMMUNITY): Payer: Medicaid Other

## 2022-07-10 ENCOUNTER — Telehealth (HOSPITAL_COMMUNITY): Payer: Self-pay | Admitting: Clinical

## 2022-07-10 ENCOUNTER — Ambulatory Visit (HOSPITAL_COMMUNITY): Payer: Medicaid Other | Admitting: Clinical

## 2022-07-10 NOTE — Telephone Encounter (Signed)
The patient conected via video , however, noted something else involving a situation with her sister came up and she would not be able to complete the appointment and would call back in to reschedule.

## 2022-07-11 ENCOUNTER — Ambulatory Visit (HOSPITAL_COMMUNITY): Payer: Medicaid Other

## 2022-07-11 ENCOUNTER — Encounter (HOSPITAL_COMMUNITY): Payer: Self-pay

## 2022-07-11 DIAGNOSIS — Z9181 History of falling: Secondary | ICD-10-CM

## 2022-07-11 DIAGNOSIS — M6281 Muscle weakness (generalized): Secondary | ICD-10-CM | POA: Diagnosis not present

## 2022-07-11 DIAGNOSIS — R262 Difficulty in walking, not elsewhere classified: Secondary | ICD-10-CM | POA: Diagnosis not present

## 2022-07-11 DIAGNOSIS — R2689 Other abnormalities of gait and mobility: Secondary | ICD-10-CM

## 2022-07-11 DIAGNOSIS — M25571 Pain in right ankle and joints of right foot: Secondary | ICD-10-CM | POA: Diagnosis not present
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
## 2022-07-15 ENCOUNTER — Ambulatory Visit (HOSPITAL_COMMUNITY): Payer: Medicaid Other | Admitting: Physical Therapy

## 2022-07-15 DIAGNOSIS — R2689 Other abnormalities of gait and mobility: Secondary | ICD-10-CM | POA: Diagnosis not present

## 2022-07-15 DIAGNOSIS — M25571 Pain in right ankle and joints of right foot: Secondary | ICD-10-CM | POA: Diagnosis not present

## 2022-07-15 DIAGNOSIS — Z9181 History of falling: Secondary | ICD-10-CM | POA: Diagnosis not present

## 2022-07-15 DIAGNOSIS — M6281 Muscle weakness (generalized): Secondary | ICD-10-CM

## 2022-07-15 DIAGNOSIS — R262 Difficulty in walking, not elsewhere classified: Secondary | ICD-10-CM

## 2022-07-15 DIAGNOSIS — Z79899 Other long term (current) drug therapy: Secondary | ICD-10-CM | POA: Diagnosis not present
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
## 2022-07-17 ENCOUNTER — Ambulatory Visit (HOSPITAL_COMMUNITY): Payer: Medicaid Other | Admitting: Physical Therapy

## 2022-07-17 DIAGNOSIS — R262 Difficulty in walking, not elsewhere classified: Secondary | ICD-10-CM | POA: Diagnosis not present

## 2022-07-17 DIAGNOSIS — R2689 Other abnormalities of gait and mobility: Secondary | ICD-10-CM | POA: Diagnosis not present

## 2022-07-17 DIAGNOSIS — Z9181 History of falling: Secondary | ICD-10-CM | POA: Diagnosis not present

## 2022-07-17 DIAGNOSIS — M25571 Pain in right ankle and joints of right foot: Secondary | ICD-10-CM

## 2022-07-17 DIAGNOSIS — M6281 Muscle weakness (generalized): Secondary | ICD-10-CM
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
## 2022-07-22 ENCOUNTER — Ambulatory Visit (HOSPITAL_COMMUNITY): Payer: Medicaid Other | Admitting: Physical Therapy

## 2022-07-22 DIAGNOSIS — M6281 Muscle weakness (generalized): Secondary | ICD-10-CM

## 2022-07-22 DIAGNOSIS — Z9181 History of falling: Secondary | ICD-10-CM | POA: Diagnosis not present

## 2022-07-22 DIAGNOSIS — R2689 Other abnormalities of gait and mobility: Secondary | ICD-10-CM | POA: Diagnosis not present

## 2022-07-22 DIAGNOSIS — M25571 Pain in right ankle and joints of right foot: Secondary | ICD-10-CM

## 2022-07-22 DIAGNOSIS — R262 Difficulty in walking, not elsewhere classified: Secondary | ICD-10-CM
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
## 2022-07-23 ENCOUNTER — Ambulatory Visit: Payer: Self-pay

## 2022-07-23 ENCOUNTER — Telehealth: Payer: Medicaid Other | Admitting: Physician Assistant

## 2022-07-23 DIAGNOSIS — W540XXA Bitten by dog, initial encounter: Secondary | ICD-10-CM

## 2022-07-23 DIAGNOSIS — R21 Rash and other nonspecific skin eruption: Secondary | ICD-10-CM

## 2022-07-23 MED ORDER — AMOXICILLIN-POT CLAVULANATE 875-125 MG PO TABS: 1.0000 | ORAL_TABLET | Freq: Two times a day (BID) | ORAL | 0 refills | Status: DC

## 2022-07-23 NOTE — Progress Notes (Signed)
Virtual Visit Consent   Angela [name], you are scheduled for a virtual visit with a [facility] provider today. Just as with appointments in the office, your consent must be obtained to participate. Your consent will be active for this visit and any virtual visit you may have with one of our providers in the next 365 days. If you have a MyChart account, a copy of this consent can be sent to you electronically.  As this is a virtual visit, video technology does not allow for your provider to perform a traditional examination. This may limit your provider's ability to fully assess your condition. If your provider identifies any concerns that need to be evaluated in person or the need to arrange testing (such as labs, EKG, etc.), we will make arrangements to do so. Although advances in technology are sophisticated, we cannot ensure that it will always work on either your end or our end. If the connection with a video visit is poor, the visit may have to be switched to a telephone visit. With either a video or telephone visit, we are not always able to ensure that we have a secure connection.  By engaging in this virtual visit, you consent to the provision of healthcare and authorize for your insurance to be billed (if applicable) for the services provided during this visit. Depending on your insurance coverage, you may receive a charge related to this service.  I need to obtain your verbal consent now. Are you willing to proceed with your visit today? [name] has provided verbal consent on 07/23/2022 for a virtual visit (video or telephone). Angela [name], [place]  Date: 07/23/2022 1:12 PM  Virtual Visit via Video Note   I, Angela [name], connected with  Angela [name]  ([id number], 2000-12-07) on 07/23/22 at  1:00 PM EST by a video-enabled telemedicine application and verified that I am speaking with the correct person using two identifiers.  Location: Patient: Virtual Visit  Location Patient: Home Provider: Virtual Visit Location Provider: Home Office   I discussed the limitations of evaluation and management by telemedicine and the availability of in person appointments. The patient expressed understanding and agreed to proceed.    History of Present Illness: Angela [name] is a 22 y.o. who identifies as a female who was assigned female at birth, and is being seen today for infected bite of her R leg, just below the knee. Notes her pet dog bit her on the leg yesterday as she was trying to break up a fight between it and her other dog. Was "nipped" in the process. Both animals are acting normal and are up-to-date on vaccines. Patient up-to-date on her TDaP. Notes overnight developing expanding redness, tenderness and warmth. Denies any drainage from puncture wound. Is able to fully extend and flex her leg at the knee.  HPI: HPI  Problems:  Patient Active Problem List   Diagnosis Date Noted   Family history of diabetes mellitus (DM) 07/04/2022   Morbid obesity ([place]) 07/04/2022   Annual physical exam 07/04/2022   Elevated LDL cholesterol level 07/04/2022   Avitaminosis D 07/04/2022   Need for influenza vaccination 04/27/2021   Depression, recurrent ([place]) 09/18/2020   Paraplegia ([place]) 07/02/2017   Lumbar spinal cord injury ([place])    Paralysis ([place])     Allergies:  Allergies  Allergen Reactions   Vancomycin Rash    Had red itchy rash of face and neck Had red itchy rash of face and neck  Had red itchy rash of face and neck   Latex Rash   Medications:  Current Outpatient Medications:    amitriptyline (ELAVIL) 50 MG tablet, Take 1 tablet (50 mg total) by mouth at bedtime., Disp: 90 tablet, Rfl: 3   baclofen (LIORESAL) 10 MG tablet, 10 mg in AM; 20 mg in PM, Disp: 270 each, Rfl: 3   escitalopram (LEXAPRO) 20 MG tablet, Take 1 tablet (20 mg total) by mouth daily., Disp: 90 tablet, Rfl: 3   gabapentin (NEURONTIN) 400 MG capsule, TAKE 2 CAPSULES BY MOUTH 4  TIMES DAILY, Disp: 720 capsule, Rfl: 0   ibuprofen (ADVIL) 800 MG tablet, Take 1 tablet (800 mg total) by mouth every 8 (eight) hours as needed. Take with food, Disp: 21 tablet, Rfl: 0   oxyCODONE-acetaminophen (PERCOCET/ROXICET) 5-325 MG tablet, Take 1 tablet by mouth 2 (two) times daily as needed., Disp: , Rfl:    rosuvastatin (CRESTOR) 20 MG tablet, Take 1 tablet (20 mg total) by mouth daily., Disp: 90 tablet, Rfl: 1  Observations/Objective: Patient is well-developed, well-nourished in no acute distress.  Resting comfortably at home.  Head is normocephalic, atraumatic.  No labored breathing. Speech is clear and coherent with logical content.  Patient is alert and oriented at baseline.  3 punctures noted of RLE, just inferior to knee, medially. Surrounding area of erythema, about the size of a softball on estimation without any active drainage. ROM performed.   Assessment and Plan: 1. Dog bite, initial encounter  Pet. No concern for rabies. Patient up-to-date on TDaP (08/2020). Supportive measures and OTC medications, including wound care discussed. Augmentin BID x 7 days. In person follow-up discussed. Needs to seek in person evaluation if symptoms are not improving in 48 hours or any new or worsening symptoms despite treatment.   Follow Up Instructions: I discussed the assessment and treatment plan with the patient. The patient was provided an opportunity to ask questions and all were answered. The patient agreed with the plan and demonstrated an understanding of the instructions.  A copy of instructions were sent to the patient via MyChart unless otherwise noted below.   The patient was advised to call back or seek an in-person evaluation if the symptoms worsen or if the condition fails to improve as anticipated.  Time:  I spent 10 minutes with the patient via telehealth technology discussing the above problems/concerns.    Angela Rio, PA-C

## 2022-07-23 NOTE — Patient Instructions (Signed)
  Angela [name], thank you for joining [name], PA-C for today's virtual visit.  While this provider is not your primary care provider (PCP), if your PCP is located in our provider database this encounter information will be shared with them immediately following your visit.   [facility] account gives you access to today's visit and all your visits, tests, and labs performed at [facility] " click here if you don't have a [facility] account or go to [email or web address]  Consent: (Patient) Angela [name] provided verbal consent for this virtual visit at the beginning of the encounter.  Current Medications:  Current Outpatient Medications:    amitriptyline (ELAVIL) 50 MG tablet, Take 1 tablet (50 mg total) by mouth at bedtime., Disp: 90 tablet, Rfl: 3   baclofen (LIORESAL) 10 MG tablet, 10 mg in AM; 20 mg in PM, Disp: 270 each, Rfl: 3   escitalopram (LEXAPRO) 20 MG tablet, Take 1 tablet (20 mg total) by mouth daily., Disp: 90 tablet, Rfl: 3   gabapentin (NEURONTIN) 400 MG capsule, TAKE 2 CAPSULES BY MOUTH 4 TIMES DAILY, Disp: 720 capsule, Rfl: 0   ibuprofen (ADVIL) 800 MG tablet, Take 1 tablet (800 mg total) by mouth every 8 (eight) hours as needed. Take with food, Disp: 21 tablet, Rfl: 0   oxyCODONE-acetaminophen (PERCOCET/ROXICET) 5-325 MG tablet, Take 1 tablet by mouth 2 (two) times daily as needed., Disp: , Rfl:    rosuvastatin (CRESTOR) 20 MG tablet, Take 1 tablet (20 mg total) by mouth daily., Disp: 90 tablet, Rfl: 1   Medications ordered in this encounter:  No orders of the defined types were placed in this encounter.    *If you need refills on other medications prior to your next appointment, please contact your pharmacy*  Follow-Up: Call back or seek an in-person evaluation if the symptoms worsen or if the condition fails to improve as anticipated.  [facility] [phone]  Other Instructions Please keep the  skin clean and dry. Take the antibiotic as directed, with food, until completed. You can do the cold compresses we discussed. If not improving over next 48 hours, I want you to be evaluated in person.    If you have been instructed to have an in-person evaluation today at a local Urgent Care facility, please use the link below. It will take you to a list of all of our available [facility], including address, phone number and hours of operation. Please do not delay care.  [facility]  If you or a family member do not have a primary care provider, use the link below to schedule a visit and establish care. When you choose a [facility] primary care physician or advanced practice provider, you gain a long-term partner in health. Find a Primary Care Provider  Learn more about [facility]'s in-office and virtual care options: [facility]

## 2022-07-23 NOTE — Telephone Encounter (Signed)
Reason for Disposition . Looks infected (spreading redness, red streak, pus)  Answer Assessment - Initial Assessment Questions 1. ANIMAL: "What type of animal caused the bite?" "Is the injury from a bite or a claw?" If the animal is a dog or a cat, ask: "Was it a pet or a stray?" "Was it acting ill or behaving strangely?"     Dog bite- pet 2. LOCATION: "Where is the bite located?"      Knee right 3. SIZE: "How big is the bite?" "What does it look like?"      Puncture hole x 3  woke up bruising hot red to touch  4. ONSET: "When did the bite happen?" (Minutes or hours ago)      Was breaking up fight one of her dog 5. CIRCUMSTANCES: "Tell me how this happened."      Dogs were fighting 6. TETANUS: "When was your last tetanus booster?"     unsure 7. RABIES VACCINE: For dog or cat bites, ask: "Do you know if the pet is vaccinated against rabies?"  (e.g., yes, no, overdue for rabies shot, unknown)     UTD 8. PREGNANCY: "Is there any chance you are pregnant?" "When was your last menstrual period?"     N/a  Protocols used: Animal Bite-A-AH

## 2022-07-24 ENCOUNTER — Ambulatory Visit: Payer: Medicaid Other | Admitting: Family Medicine

## 2022-07-25 ENCOUNTER — Encounter (HOSPITAL_COMMUNITY): Payer: Self-pay | Admitting: Physical Therapy

## 2022-07-25 ENCOUNTER — Ambulatory Visit (HOSPITAL_COMMUNITY): Payer: Medicaid Other | Admitting: Physical Therapy

## 2022-07-25 DIAGNOSIS — R2689 Other abnormalities of gait and mobility: Secondary | ICD-10-CM | POA: Diagnosis not present

## 2022-07-25 DIAGNOSIS — R262 Difficulty in walking, not elsewhere classified: Secondary | ICD-10-CM

## 2022-07-25 DIAGNOSIS — M25571 Pain in right ankle and joints of right foot: Secondary | ICD-10-CM

## 2022-07-25 DIAGNOSIS — M6281 Muscle weakness (generalized): Secondary | ICD-10-CM | POA: Diagnosis not present

## 2022-07-25 DIAGNOSIS — Z9181 History of falling: Secondary | ICD-10-CM | POA: Diagnosis not present
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
## 2022-07-30 ENCOUNTER — Encounter (HOSPITAL_COMMUNITY): Payer: Medicaid Other | Admitting: Physical Therapy

## 2022-08-01 ENCOUNTER — Ambulatory Visit (HOSPITAL_COMMUNITY): Payer: Medicaid Other | Attending: Student | Admitting: Physical Therapy

## 2022-08-01 ENCOUNTER — Encounter (HOSPITAL_COMMUNITY): Payer: Medicaid Other | Admitting: Physical Therapy

## 2022-08-01 DIAGNOSIS — M21372 Foot drop, left foot: Secondary | ICD-10-CM | POA: Diagnosis not present

## 2022-08-01 DIAGNOSIS — M25571 Pain in right ankle and joints of right foot: Secondary | ICD-10-CM | POA: Diagnosis not present

## 2022-08-01 DIAGNOSIS — M6281 Muscle weakness (generalized): Secondary | ICD-10-CM | POA: Insufficient documentation

## 2022-08-01 DIAGNOSIS — Z419 Encounter for procedure for purposes other than remedying health state, unspecified: Secondary | ICD-10-CM | POA: Diagnosis not present

## 2022-08-01 DIAGNOSIS — R262 Difficulty in walking, not elsewhere classified: Secondary | ICD-10-CM | POA: Diagnosis not present
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
## 2022-08-06 ENCOUNTER — Other Ambulatory Visit: Payer: Self-pay | Admitting: Family Medicine

## 2022-08-07 ENCOUNTER — Ambulatory Visit (HOSPITAL_COMMUNITY): Payer: Medicaid Other | Admitting: Physical Therapy

## 2022-08-07 DIAGNOSIS — R262 Difficulty in walking, not elsewhere classified: Secondary | ICD-10-CM | POA: Diagnosis not present

## 2022-08-07 DIAGNOSIS — M25571 Pain in right ankle and joints of right foot: Secondary | ICD-10-CM | POA: Diagnosis not present

## 2022-08-07 DIAGNOSIS — M6281 Muscle weakness (generalized): Secondary | ICD-10-CM | POA: Diagnosis not present
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
## 2022-08-08 ENCOUNTER — Other Ambulatory Visit: Payer: Self-pay | Admitting: Family Medicine

## 2022-08-08 DIAGNOSIS — E559 Vitamin D deficiency, unspecified: Secondary | ICD-10-CM

## 2022-08-08 DIAGNOSIS — R748 Abnormal levels of other serum enzymes: Secondary | ICD-10-CM

## 2022-08-08 DIAGNOSIS — E7800 Pure hypercholesterolemia, unspecified: Secondary | ICD-10-CM

## 2022-08-12 ENCOUNTER — Ambulatory Visit (INDEPENDENT_AMBULATORY_CARE_PROVIDER_SITE_OTHER): Payer: Medicaid Other | Admitting: Clinical

## 2022-08-12 DIAGNOSIS — F419 Anxiety disorder, unspecified: Secondary | ICD-10-CM | POA: Diagnosis not present

## 2022-08-12 DIAGNOSIS — F4310 Post-traumatic stress disorder, unspecified: Secondary | ICD-10-CM | POA: Diagnosis not present

## 2022-08-12 DIAGNOSIS — F331 Major depressive disorder, recurrent, moderate: Secondary | ICD-10-CM | POA: Diagnosis not present

## 2022-08-12 NOTE — Progress Notes (Signed)
Virtual Visit via Video Note  I connected with Angela [name] on 08/12/22 at  1:00 PM EST by a video enabled telemedicine application and verified that I am speaking with the correct person using two identifiers.  Location: Patient: Home Provider: Office   I discussed the limitations of evaluation and management by telemedicine and the availability of in person appointments. The patient expressed understanding and agreed to proceed.  THERAPIST PROGRESS NOTE   Session Time: 1:00 PM-1:30 PM   Participation Level: Active   Behavioral Response: CasualAlertDepressed   Type of Therapy: Individual Therapy   Treatment Goals addressed: Coping   Interventions: CBT, Motivational Interviewing, Strength-based and Supportive   Summary: Angela [name] is a 22 y.o. female who presents with Depression with Anxiety and PTSD.The OPT therapist worked with the patient for her ongoing outpatient. OPT treatment. The OPT therapist utilized Motivational Interviewing to assist in creating therapeutic repore. The patient in the session was engaged and work in collaboration giving feedback about her triggers and symptoms over the past few weeks. The patient spoke about the impact of doing PT which she is looking to extend past February if her insurance approves.The OPT therapist utilized Cognitive Behavioral Therapy through cognitive restructuring as well as worked with the patient on self awareness and implementing coping strategies to assist in management of current stressors.The patient was encouraged to continue work towards her goals including getting license and continuing her education through doing online classes.The OPT therapist provided ongoing psychoeducation and support throughout the session with the patient. The OPT therapist worked with the patient overviewing upcoming appointments as listed in the patients [place].   Suicidal/Homicidal: Nowithout intent/plan   Therapist Response: The OPT  therapist worked with the patient for the patients scheduled session. The patient was engaged in her session and gave feedback in relation to triggers, symptoms, and behavior responses over the past few weeks.The OPT therapist worked with the patient utilizing an in session Cognitive Behavioral Therapy exercise. The patient was responsive in the session and verbalized, " I have been feeling a lot better with doing the physical therapy it helps a lot with the exercise and it gets me out of the house I down to 1 time per week due to insurance but I am going to try to get re approved for another round of PT after this because I think it is helping my mental state with getting out of the house more and the exercise helps me feel better". The OPT therapist gauged the patients mood baseline over the past few weeks. The patient verbalized plans for the upcoming Valentines holiday in which she will be attending a friends family members wedding in [place]. The OPT therapist continued to encourage the patient to keep doing her self check-ins, and continuing to implement her coping strategies. The OPT therapist will continue treatment work with the patient in her next scheduled session.   Plan: Return again in 2/3 weeks.   Diagnosis:      Axis I: Recurrent Moderate Depression with anxiety and PTSD (post-traumatic stress disorder)                           Axis II: No diagnosis       Collaboration of Care: No additional Collaboration for this session.   Patient/Guardian was advised Release of Information must be obtained prior to any record release in order to collaborate their care with an outside provider. Patient/Guardian was advised if  they have not already done so to contact the registration department to sign all necessary forms in order for [place] to release information regarding their care.    Consent: Patient/Guardian gives verbal consent for treatment and assignment of benefits for services provided  during this visit. Patient/Guardian expressed understanding and agreed to proceed.    I discussed the assessment and treatment plan with the patient. The patient was provided an opportunity to ask questions and all were answered. The patient agreed with the plan and demonstrated an understanding of the instructions.   The patient was advised to call back or seek an in-person evaluation if the symptoms worsen or if the condition fails to improve as anticipated.   I provided 30 minutes of non-face-to-face time during this encounter.     Angela Grumbles, LCSW   08/12/2022

## 2022-08-13 ENCOUNTER — Ambulatory Visit (HOSPITAL_COMMUNITY): Payer: Medicaid Other | Admitting: Physical Therapy

## 2022-08-13 DIAGNOSIS — M25571 Pain in right ankle and joints of right foot: Secondary | ICD-10-CM | POA: Diagnosis not present

## 2022-08-13 DIAGNOSIS — M6281 Muscle weakness (generalized): Secondary | ICD-10-CM | POA: Diagnosis not present

## 2022-08-13 DIAGNOSIS — R262 Difficulty in walking, not elsewhere classified: Secondary | ICD-10-CM | POA: Diagnosis not present
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
## 2022-08-15 ENCOUNTER — Encounter (HOSPITAL_COMMUNITY): Payer: Medicaid Other

## 2022-08-19 ENCOUNTER — Encounter (HOSPITAL_COMMUNITY): Payer: Medicaid Other | Admitting: Physical Therapy

## 2022-08-20 DIAGNOSIS — F419 Anxiety disorder, unspecified: Secondary | ICD-10-CM | POA: Diagnosis not present

## 2022-08-20 DIAGNOSIS — M792 Neuralgia and neuritis, unspecified: Secondary | ICD-10-CM | POA: Diagnosis not present

## 2022-08-20 DIAGNOSIS — Z0130 Encounter for examination of blood pressure without abnormal findings: Secondary | ICD-10-CM | POA: Diagnosis not present

## 2022-08-20 DIAGNOSIS — Z6839 Body mass index (BMI) 39.0-39.9, adult: Secondary | ICD-10-CM | POA: Diagnosis not present

## 2022-08-20 DIAGNOSIS — S34109S Unspecified injury to unspecified level of lumbar spinal cord, sequela: Secondary | ICD-10-CM | POA: Diagnosis not present

## 2022-08-20 DIAGNOSIS — G8921 Chronic pain due to trauma: Secondary | ICD-10-CM | POA: Diagnosis not present

## 2022-08-20 DIAGNOSIS — M549 Dorsalgia, unspecified: Secondary | ICD-10-CM | POA: Diagnosis not present

## 2022-08-20 DIAGNOSIS — Z79899 Other long term (current) drug therapy: Secondary | ICD-10-CM | POA: Diagnosis not present

## 2022-08-20 DIAGNOSIS — M62838 Other muscle spasm: Secondary | ICD-10-CM | POA: Diagnosis not present

## 2022-08-20 DIAGNOSIS — Z3200 Encounter for pregnancy test, result unknown: Secondary | ICD-10-CM | POA: Diagnosis not present

## 2022-08-21 ENCOUNTER — Ambulatory Visit (HOSPITAL_COMMUNITY): Payer: Medicaid Other | Admitting: Physical Therapy

## 2022-08-21 DIAGNOSIS — R262 Difficulty in walking, not elsewhere classified: Secondary | ICD-10-CM

## 2022-08-21 DIAGNOSIS — M25571 Pain in right ankle and joints of right foot: Secondary | ICD-10-CM | POA: Diagnosis not present

## 2022-08-21 DIAGNOSIS — M6281 Muscle weakness (generalized): Secondary | ICD-10-CM | POA: Diagnosis not present
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
## 2022-08-26 ENCOUNTER — Encounter (HOSPITAL_COMMUNITY): Payer: Medicaid Other | Admitting: Physical Therapy

## 2022-08-28 ENCOUNTER — Encounter (HOSPITAL_COMMUNITY): Payer: Medicaid Other | Admitting: Physical Therapy

## 2022-08-30 DIAGNOSIS — Z419 Encounter for procedure for purposes other than remedying health state, unspecified: Secondary | ICD-10-CM | POA: Diagnosis not present

## 2022-09-06 ENCOUNTER — Other Ambulatory Visit: Payer: Self-pay

## 2022-09-06 ENCOUNTER — Telehealth: Payer: Self-pay

## 2022-09-06 NOTE — Telephone Encounter (Signed)
Pt called triage, had a positive pregnancy test this morning and she wants advise regarding all the medications she's taking (I updated her Rx's, the oxycodone is as needed/emergency). Everything else she is taking. Pls advise. Pregnancy confirmation appt scheduled for 09/09/22.

## 2022-09-09 ENCOUNTER — Ambulatory Visit (INDEPENDENT_AMBULATORY_CARE_PROVIDER_SITE_OTHER): Payer: Medicaid Other

## 2022-09-09 ENCOUNTER — Encounter: Payer: Self-pay | Admitting: Certified Nurse Midwife

## 2022-09-09 VITALS — BP 128/80 | Ht 66.5 in | Wt 240.0 lb

## 2022-09-09 DIAGNOSIS — Z3200 Encounter for pregnancy test, result unknown: Secondary | ICD-10-CM

## 2022-09-09 DIAGNOSIS — Z3201 Encounter for pregnancy test, result positive: Secondary | ICD-10-CM

## 2022-09-09 LAB — POCT URINE PREGNANCY: Preg Test, Ur: POSITIVE — AB

## 2022-09-09 NOTE — Progress Notes (Signed)
Subjective:    Angela [name] is a 22 y.o. female who presents for evaluation of amenorrhea. She believes she could be pregnant. Pregnancy is desired.  Last period was normal.   No LMP recorded. The following portions of the patient's history were reviewed and updated as appropriate: allergies, current medications, past family history, past medical history, past social history, past surgical history, and problem list.   Lab Review Urine HCG: positive    Assessment:    Absence of menstruation.     Plan:    Pregnancy Test:  Positive: EDC: 04/30/23. Briefly discussed positive results and sent to check out for scheduling for New OB appointments.

## 2022-09-09 NOTE — Patient Instructions (Signed)
First Trimester of Pregnancy  The first trimester of pregnancy starts on the first day of your last menstrual period until the end of week 12. This is also called months 1 through 3 of pregnancy. Body changes during your first trimester Your body goes through many changes during pregnancy. The changes usually return to normal after your baby is born. Physical changes You may gain or lose weight. Your breasts may grow larger and hurt. The area around your nipples may get darker. Dark spots or blotches may develop on your face. You may have changes in your hair. Health changes You may feel like you might vomit (nauseous), and you may vomit. You may have heartburn. You may have headaches. You may have trouble pooping (constipation). Your gums may bleed. Other changes You may get tired easily. You may pee (urinate) more often. Your menstrual periods will stop. You may not feel hungry. You may want to eat certain kinds of food. You may have changes in your emotions from day to day. You may have more dreams. Follow these instructions at home: Medicines Take over-the-counter and prescription medicines only as told by your doctor. Some medicines are not safe during pregnancy. Take a prenatal vitamin that contains at least 600 micrograms (mcg) of folic acid. Eating and drinking Eat healthy meals that include: Fresh fruits and vegetables. Whole grains. Good sources of protein, such as meat, eggs, or tofu. Low-fat dairy products. Avoid raw meat and unpasteurized juice, milk, and cheese. If you feel like you may vomit, or you vomit: Eat 4 or 5 small meals a day instead of 3 large meals. Try eating a few soda crackers. Drink liquids between meals instead of during meals. You may need to take these actions to prevent or treat trouble pooping: Drink enough fluids to keep your pee (urine) pale yellow. Eat foods that are high in fiber. These include beans, whole grains, and fresh fruits and  vegetables. Limit foods that are high in fat and sugar. These include fried or sweet foods. Activity Exercise only as told by your doctor. Most people can do their usual exercise routine during pregnancy. Stop exercising if you have cramps or pain in your lower belly (abdomen) or low back. Do not exercise if it is too hot or too humid, or if you are in a place of great height (high altitude). Avoid heavy lifting. If you choose to, you may have sex unless your doctor tells you not to. Relieving pain and discomfort Wear a good support bra if your breasts are sore. Rest with your legs raised (elevated) if you have leg cramps or low back pain. If you have bulging veins (varicose veins) in your legs: Wear support hose as told by your doctor. Raise your feet for 15 minutes, 3-4 times a day. Limit salt in your food. Safety Wear your seat belt at all times when you are in a car. Talk with your doctor if someone is hurting you or yelling at you. Talk with your doctor if you are feeling sad or have thoughts of hurting yourself. Lifestyle Do not use hot tubs, steam rooms, or saunas. Do not douche. Do not use tampons or scented sanitary pads. Do not use herbal medicines, illegal drugs, or medicines that are not approved by your doctor. Do not drink alcohol. Do not smoke or use any products that contain nicotine or tobacco. If you need help quitting, ask your doctor. Avoid cat litter boxes and soil that is used by cats. These carry   germs that can cause harm to the baby and can cause a loss of your baby by miscarriage or stillbirth. General instructions Keep all follow-up visits. This is important. Ask for help if you need counseling or if you need help with nutrition. Your doctor can give you advice or tell you where to go for help. Visit your dentist. At home, brush your teeth with a soft toothbrush. Floss gently. Write down your questions. Take them to your prenatal visits. Where to find more  information American Pregnancy Association: americanpregnancy.org American College of Obstetricians and Gynecologists: www.acog.org Office on Women's Health: womenshealth.gov/pregnancy Contact a doctor if: You are dizzy. You have a fever. You have mild cramps or pressure in your lower belly. You have a nagging pain in your belly area. You continue to feel like you may vomit, you vomit, or you have watery poop (diarrhea) for 24 hours or longer. You have a bad-smelling fluid coming from your vagina. You have pain when you pee. You are exposed to a disease that spreads from person to person, such as chickenpox, measles, Zika virus, HIV, or hepatitis. Get help right away if: You have spotting or bleeding from your vagina. You have very bad belly cramping or pain. You have shortness of breath or chest pain. You have any kind of injury, such as from a fall or a car crash. You have new or increased pain, swelling, or redness in an arm or leg. Summary The first trimester of pregnancy starts on the first day of your last menstrual period until the end of week 12 (months 1 through 3). Eat 4 or 5 small meals a day instead of 3 large meals. Do not smoke or use any products that contain nicotine or tobacco. If you need help quitting, ask your doctor. Keep all follow-up visits. This information is not intended to replace advice given to you by your health care provider. Make sure you discuss any questions you have with your health care provider. Document Revised: 11/24/2019 Document Reviewed: 09/30/2019 Elsevier Patient Education  2023 [employer]  

## 2022-09-10 ENCOUNTER — Ambulatory Visit (HOSPITAL_COMMUNITY): Payer: Medicaid Other | Admitting: Clinical

## 2022-09-23 ENCOUNTER — Ambulatory Visit (INDEPENDENT_AMBULATORY_CARE_PROVIDER_SITE_OTHER): Payer: Medicaid Other

## 2022-09-23 VITALS — Wt 240.0 lb

## 2022-09-23 DIAGNOSIS — Z369 Encounter for antenatal screening, unspecified: Secondary | ICD-10-CM

## 2022-09-23 DIAGNOSIS — Z3689 Encounter for other specified antenatal screening: Secondary | ICD-10-CM

## 2022-09-23 DIAGNOSIS — Z3480 Encounter for supervision of other normal pregnancy, unspecified trimester: Secondary | ICD-10-CM | POA: Insufficient documentation

## 2022-09-23 NOTE — Patient Instructions (Signed)
Commonly Asked Questions During Pregnancy  Cats: A parasite can be excreted in cat feces.  To avoid exposure you need to have another person empty the little box.  If you must empty the litter box you will need to wear gloves.  Wash your hands after handling your cat.  This parasite can also be found in raw or undercooked meat so this should also be avoided.  Colds, Sore Throats, Flu: Please check your medication sheet to see what you can take for symptoms.  If your symptoms are unrelieved by these medications please call the office.  Dental Work: Most any dental work your dentist recommends is permitted.  X-rays should only be taken during the first trimester if absolutely necessary.  Your abdomen should be shielded with a lead apron during all x-rays.  Please notify your provider prior to receiving any x-rays.  Novocaine is fine; gas is not recommended.  If your dentist requires a note from us prior to dental work please call the office and we will provide one for you.  Exercise: Exercise is an important part of staying healthy during your pregnancy.  You may continue most exercises you were accustomed to prior to pregnancy.  Later in your pregnancy you will most likely notice you have difficulty with activities requiring balance like riding a bicycle.  It is important that you listen to your body and avoid activities that put you at a higher risk of falling.  Adequate rest and staying well hydrated are a must!  If you have questions about the safety of specific activities ask your provider.    Exposure to Children with illness: Try to avoid obvious exposure; report any symptoms to us when noted,  If you have chicken pos, red measles or mumps, you should be immune to these diseases.   Please do not take any vaccines while pregnant unless you have checked with your OB provider.  Fetal Movement: After 28 weeks we recommend you do "kick counts" twice daily.  Lie or sit down in a calm quiet environment and  count your baby movements "kicks".  You should feel your baby at least 10 times per hour.  If you have not felt 10 kicks within the first hour get up, walk around and have something sweet to eat or drink then repeat for an additional hour.  If count remains less than 10 per hour notify your provider.  Fumigating: Follow your pest control agent's advice as to how long to stay out of your home.  Ventilate the area well before re-entering.  Hemorrhoids:   Most over-the-counter preparations can be used during pregnancy.  Check your medication to see what is safe to use.  It is important to use a stool softener or fiber in your diet and to drink lots of liquids.  If hemorrhoids seem to be getting worse please call the office.   Hot Tubs:  Hot tubs Jacuzzis and saunas are not recommended while pregnant.  These increase your internal body temperature and should be avoided.  Intercourse:  Sexual intercourse is safe during pregnancy as long as you are comfortable, unless otherwise advised by your provider.  Spotting may occur after intercourse; report any bright red bleeding that is heavier than spotting.  Labor:  If you know that you are in labor, please go to the hospital.  If you are unsure, please call the office and let us help you decide what to do.  Lifting, straining, etc:  If your job requires heavy   lifting or straining please check with your provider for any limitations.  Generally, you should not lift items heavier than that you can lift simply with your hands and arms (no back muscles)  Painting:  Paint fumes do not harm your pregnancy, but may make you ill and should be avoided if possible.  Latex or water based paints have less odor than oils.  Use adequate ventilation while painting.  Permanents & Hair Color:  Chemicals in hair dyes are not recommended as they cause increase hair dryness which can increase hair loss during pregnancy.  " Highlighting" and permanents are allowed.  Dye may be  absorbed differently and permanents may not hold as well during pregnancy.  Sunbathing:  Use a sunscreen, as skin burns easily during pregnancy.  Drink plenty of fluids; avoid over heating.  Tanning Beds:  Because their possible side effects are still unknown, tanning beds are not recommended.  Ultrasound Scans:  Routine ultrasounds are performed at approximately 20 weeks.  You will be able to see your baby's general anatomy an if you would like to know the gender this can usually be determined as well.  If it is questionable when you conceived you may also receive an ultrasound early in your pregnancy for dating purposes.  Otherwise ultrasound exams are not routinely performed unless there is a medical necessity.  Although you can request a scan we ask that you pay for it when conducted because insurance does not cover " patient request" scans.  Work: If your pregnancy proceeds without complications you may work until your due date, unless your physician or employer advises otherwise.  Round Ligament Pain/Pelvic Discomfort:  Sharp, shooting pains not associated with bleeding are fairly common, usually occurring in the second trimester of pregnancy.  They tend to be worse when standing up or when you remain standing for long periods of time.  These are the result of pressure of certain pelvic ligaments called "round ligaments".  Rest, Tylenol and heat seem to be the most effective relief.  As the womb and fetus grow, they rise out of the pelvis and the discomfort improves.  Please notify the office if your pain seems different than that described.  It may represent a more serious condition.  Common Medications Safe in Pregnancy  Acne:      Constipation:  Benzoyl Peroxide     Colace  Clindamycin      Dulcolax Suppository  Topica Erythromycin     Fibercon  Salicylic Acid      Metamucil         Miralax AVOID:        Senakot   Accutane    Cough:  Retin-A       Cough  Drops  Tetracycline      Phenergan w/ Codeine if Rx  Minocycline      Robitussin (Plain & DM)  Antibiotics:     Crabs/Lice:  Ceclor       RID  Cephalosporins    AVOID:  E-Mycins      Kwell  Keflex  Macrobid/Macrodantin   Diarrhea:  Penicillin      Kao-Pectate  Zithromax      Imodium AD         PUSH FLUIDS AVOID:       Cipro     Fever:  Tetracycline      Tylenol (Regular or Extra  Minocycline       Strength)  Levaquin      Extra Strength-Do not            Exceed 8 tabs/24 hrs Caffeine:        <200mg/day (equiv. To 1 cup of coffee or  approx. 3 12 oz sodas)         Gas: Cold/Hayfever:       Gas-X  Benadryl      Mylicon  Claritin       Phazyme  **Claritin-D        Chlor-Trimeton    Headaches:  Dimetapp      ASA-Free Excedrin  Drixoral-Non-Drowsy     Cold Compress  Mucinex (Guaifenasin)     Tylenol (Regular or Extra  Sudafed/Sudafed-12 Hour     Strength)  **Sudafed PE Pseudoephedrine   Tylenol Cold & Sinus     Vicks Vapor Rub  Zyrtec  **AVOID if Problems With Blood Pressure         Heartburn: Avoid lying down for at least 1 hour after meals  Aciphex      Maalox     Rash:  Milk of Magnesia     Benadryl    Mylanta       1% Hydrocortisone Cream  Pepcid  Pepcid Complete   Sleep Aids:  Prevacid      Ambien   Prilosec       Benadryl  Rolaids       Chamomile Tea  Tums (Limit 4/day)     Unisom         Tylenol PM         Warm milk-add vanilla or  Hemorrhoids:       Sugar for taste  Anusol/Anusol H.C.  (RX: Analapram 2.5%)  Sugar Substitutes:  Hydrocortisone OTC     Ok in moderation  Preparation H      Tucks        Vaseline lotion applied to tissue with wiping    Herpes:     Throat:  Acyclovir      Oragel  Famvir  Valtrex     Vaccines:         Flu Shot Leg Cramps:       *Gardasil  Benadryl      Hepatitis A         Hepatitis B Nasal Spray:       Pneumovax  Saline Nasal Spray     Polio Booster         Tetanus Nausea:       Tuberculosis test or PPD  Vitamin  B6 25 mg TID   AVOID:    Dramamine      *Gardasil  Emetrol       Live Poliovirus  Ginger Root 250 mg QID    MMR (measles, mumps &  High Complex Carbs @ Bedtime    rebella)  Sea Bands-Accupressure    Varicella (Chickenpox)  Unisom 1/2 tab TID     *No known complications           If received before Pain:         Known pregnancy;   Darvocet       Resume series after  Lortab        Delivery  Percocet    Yeast:   Tramadol      Femstat  Tylenol 3      Gyne-lotrimin  Ultram       Monistat  Vicodin           MISC:         All Sunscreens             Hair Coloring/highlights          Insect Repellant's          (Including DEET)         Mystic Tans  

## 2022-09-23 NOTE — Progress Notes (Signed)
New OB Intake  I connected with  Angela [name] on 09/23/22 at  9:15 AM EDT by telephone and verified that I am speaking with the correct person using two identifiers. Nurse is located at [employer] and pt is located at home.  I explained I am completing New OB Intake today. We discussed her EDD of 04/30/2023 that is based on LMP of 07/24/2022. Pt is G1/P0. Pt is in a wheelchair.  I reviewed her allergies, medications, Medical/Surgical/OB history, and appropriate screenings. There are cats in the home  yes.  If yes Indoor, Outdoor.  Pt no longer changes the litter box.  Based on history, this is a/an pregnancy uncomplicated .   Patient Active Problem List   Diagnosis Date Noted   Supervision of other normal pregnancy, antepartum 09/23/2022   Family history of diabetes mellitus (DM) 07/04/2022   Morbid obesity ([place]) 07/04/2022   Annual physical exam 07/04/2022   Elevated LDL cholesterol level 07/04/2022   Avitaminosis D 07/04/2022   Need for influenza vaccination 04/27/2021   Depression, recurrent ([place]) 09/18/2020   Paraplegia ([place]) 07/02/2017   Lumbar spinal cord injury ([place])    Paralysis ([place])     Concerns addressed today None  Delivery Plans:  Plans to deliver at [place] wants a water birth.  Adv they are done at [facility] but you need to provide your own tub.  Anatomy US Explained first scheduled [place] will be scheduled soon and an anatomy scan will be done at 20 weeks.  Labs Discussed genetic screening with patient. Patient desires genetic testing to be drawn with new OB labs. Discussed possible labs to be drawn at new OB appointment.  COVID Vaccine Patient has not had COVID vaccine.   Social Determinants of Health Food Insecurity: denies food insecurity Transportation: Patient denies transportation needs.  First visit review I reviewed new OB appt with pt. I explained she will have ob bloodwork and pap smear/pelvic exam if indicated. Explained pt  will be seen by Dr. Clovia Cuff at first visit; encounter routed to appropriate provider.   Cleophas Dunker, [facility] 09/23/2022  9:42 AM

## 2022-09-30 DIAGNOSIS — Z419 Encounter for procedure for purposes other than remedying health state, unspecified: Secondary | ICD-10-CM | POA: Diagnosis not present

## 2022-10-07 ENCOUNTER — Other Ambulatory Visit: Payer: Self-pay | Admitting: Obstetrics & Gynecology

## 2022-10-07 ENCOUNTER — Ambulatory Visit
Admission: RE | Admit: 2022-10-07 | Discharge: 2022-10-07 | Disposition: A | Discharge status: Home / Self Care | Payer: Medicaid Other | Source: Ambulatory Visit | Attending: Obstetrics & Gynecology | Admitting: Obstetrics & Gynecology

## 2022-10-07 DIAGNOSIS — Z3480 Encounter for supervision of other normal pregnancy, unspecified trimester: Secondary | ICD-10-CM | POA: Insufficient documentation

## 2022-10-07 DIAGNOSIS — Z369 Encounter for antenatal screening, unspecified: Secondary | ICD-10-CM | POA: Diagnosis present

## 2022-10-07 DIAGNOSIS — Z3481 Encounter for supervision of other normal pregnancy, first trimester: Secondary | ICD-10-CM | POA: Insufficient documentation

## 2022-10-07 DIAGNOSIS — Z3689 Encounter for other specified antenatal screening: Secondary | ICD-10-CM | POA: Diagnosis not present
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
## 2022-10-08 ENCOUNTER — Telehealth (INDEPENDENT_AMBULATORY_CARE_PROVIDER_SITE_OTHER): Payer: Medicaid Other | Admitting: Family Medicine

## 2022-10-08 ENCOUNTER — Encounter: Payer: Self-pay | Admitting: Family Medicine

## 2022-10-08 DIAGNOSIS — R42 Dizziness and giddiness: Secondary | ICD-10-CM

## 2022-10-08 DIAGNOSIS — R519 Headache, unspecified: Secondary | ICD-10-CM | POA: Diagnosis not present

## 2022-10-08 DIAGNOSIS — Z77098 Contact with and (suspected) exposure to other hazardous, chiefly nonmedicinal, chemicals: Secondary | ICD-10-CM | POA: Diagnosis not present

## 2022-10-08 DIAGNOSIS — O0991 Supervision of high risk pregnancy, unspecified, first trimester: Secondary | ICD-10-CM

## 2022-10-08 DIAGNOSIS — R067 Sneezing: Secondary | ICD-10-CM | POA: Diagnosis not present

## 2022-10-08 DIAGNOSIS — Z331 Pregnant state, incidental: Secondary | ICD-10-CM

## 2022-10-08 HISTORY — DX: Supervision of high risk pregnancy, unspecified, first trimester: O09.91

## 2022-10-08 NOTE — Assessment & Plan Note (Signed)
Patient currently [redacted] weeks pregnant and has established with OBGYN- Dr Marice Potter. Since pregnancy, patient has discontinued elavil 50 mg at bedtime as well as BID dosing of baclofen and lexapro 20 mg. She has also stopped her PRN pain medication, percocet, and a statin, crestor. Defer re-start at this time; discussion with patient regarding use of SSRI can be weighed with OBGYN and neurology team as risk vs benefit to prevent further mood exacerbations. Brief discussion with patient regarding use of Unisom OTC to assist with sleep if needed; continue non-pharmacological options to address muscle spasms which are currently interfering with patient's sleep patterns. Patient is excited for pregnancy and reports minimal concerns coming off previously described medications or pregnancy symptoms.
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
## 2022-10-14 ENCOUNTER — Other Ambulatory Visit (HOSPITAL_COMMUNITY)
Admission: RE | Admit: 2022-10-14 | Discharge: 2022-10-14 | Disposition: A | Discharge status: Home / Self Care | Payer: Medicaid Other | Source: Ambulatory Visit | Attending: Obstetrics & Gynecology | Admitting: Obstetrics & Gynecology

## 2022-10-14 ENCOUNTER — Other Ambulatory Visit: Payer: Medicaid Other

## 2022-10-14 DIAGNOSIS — Z369 Encounter for antenatal screening, unspecified: Secondary | ICD-10-CM | POA: Insufficient documentation

## 2022-10-14 DIAGNOSIS — Z3480 Encounter for supervision of other normal pregnancy, unspecified trimester: Secondary | ICD-10-CM | POA: Insufficient documentation

## 2022-10-15 LAB — URINALYSIS, ROUTINE W REFLEX MICROSCOPIC
Bilirubin, UA: NEGATIVE
Glucose, UA: NEGATIVE
Ketones, UA: NEGATIVE
Leukocytes,UA: NEGATIVE
Nitrite, UA: NEGATIVE
Protein,UA: NEGATIVE
RBC, UA: NEGATIVE
Specific Gravity, UA: 1.027 (ref 1.005–1.030)
Urobilinogen, Ur: 0.2 mg/dL (ref 0.2–1.0)
pH, UA: 6.5 (ref 5.0–7.5)

## 2022-10-15 LAB — URINE CYTOLOGY ANCILLARY ONLY
Chlamydia: NEGATIVE
Comment: NEGATIVE
Comment: NORMAL
Neisseria Gonorrhea: NEGATIVE

## 2022-10-16 LAB — MONITOR DRUG PROFILE 14(MW)
Amphetamine Scrn, Ur: NEGATIVE ng/mL
BARBITURATE SCREEN URINE: NEGATIVE ng/mL
BENZODIAZEPINE SCREEN, URINE: NEGATIVE ng/mL
Buprenorphine, Urine: NEGATIVE ng/mL
Cocaine (Metab) Scrn, Ur: NEGATIVE ng/mL
Creatinine(Crt), U: 138.6 mg/dL (ref 20.0–300.0)
Fentanyl, Urine: NEGATIVE pg/mL
Meperidine Screen, Urine: NEGATIVE ng/mL
Methadone Screen, Urine: NEGATIVE ng/mL
OXYCODONE+OXYMORPHONE UR QL SCN: NEGATIVE ng/mL
Opiate Scrn, Ur: NEGATIVE ng/mL
Ph of Urine: 6.4 (ref 4.5–8.9)
Phencyclidine Qn, Ur: NEGATIVE ng/mL
Propoxyphene Scrn, Ur: NEGATIVE ng/mL
SPECIFIC GRAVITY: 1.023
Tramadol Screen, Urine: NEGATIVE ng/mL

## 2022-10-16 LAB — NICOTINE SCREEN, URINE: Cotinine Ql Scrn, Ur: POSITIVE ng/mL — AB

## 2022-10-16 LAB — CANNABINOID (GC/MS), URINE
Cannabinoid: POSITIVE — AB
Carboxy THC (GC/MS): 38 ng/mL

## 2022-10-17 LAB — URINE CULTURE, OB REFLEX

## 2022-10-17 LAB — CULTURE, OB URINE

## 2022-10-18 ENCOUNTER — Other Ambulatory Visit: Payer: Self-pay | Admitting: Family Medicine

## 2022-10-18 DIAGNOSIS — F419 Anxiety disorder, unspecified: Secondary | ICD-10-CM

## 2022-10-18 DIAGNOSIS — G839 Paralytic syndrome, unspecified: Secondary | ICD-10-CM

## 2022-10-18 DIAGNOSIS — S34109S Unspecified injury to unspecified level of lumbar spinal cord, sequela: Secondary | ICD-10-CM

## 2022-10-18 DIAGNOSIS — F32A Depression, unspecified: Secondary | ICD-10-CM

## 2022-10-18 DIAGNOSIS — G8220 Paraplegia, unspecified: Secondary | ICD-10-CM

## 2022-10-18 DIAGNOSIS — F339 Major depressive disorder, recurrent, unspecified: Secondary | ICD-10-CM

## 2022-10-18 DIAGNOSIS — M792 Neuralgia and neuritis, unspecified: Secondary | ICD-10-CM

## 2022-10-18 DIAGNOSIS — Z1159 Encounter for screening for other viral diseases: Secondary | ICD-10-CM

## 2022-10-18 DIAGNOSIS — M62838 Other muscle spasm: Secondary | ICD-10-CM

## 2022-10-18 DIAGNOSIS — Z23 Encounter for immunization: Secondary | ICD-10-CM

## 2022-10-18 DIAGNOSIS — E559 Vitamin D deficiency, unspecified: Secondary | ICD-10-CM

## 2022-10-18 NOTE — Telephone Encounter (Signed)
Requested Prescriptions  Pending Prescriptions Disp Refills   gabapentin (NEURONTIN) 400 MG capsule [Pharmacy Med Name: Gabapentin 400 MG Oral Capsule] 720 capsule 0    Sig: TAKE 2 CAPSULES BY MOUTH 4 TIMES DAILY     Neurology: Anticonvulsants - gabapentin Passed - 10/18/2022  1:12 PM      Passed - Cr in normal range and within 360 days    Creat  Date Value Ref Range Status  02/17/2018 0.56 0.50 - 1.00 mg/dL Final   Creatinine, Ser  Date Value Ref Range Status  07/04/2022 0.63 0.57 - 1.00 mg/dL Final         Passed - Completed PHQ-2 or PHQ-9 in the last 360 days      Passed - Valid encounter within last 12 months    Recent Outpatient Visits           1 week ago High-risk pregnancy in first trimester   [facility] Jacky Kindle, FNP   3 months ago Annual physical exam   [facility] Jacky Kindle, FNP   10 months ago Depression, recurrent [facility]   [facility] Merita Norton T, FNP   1 year ago Rash   [facility] [place], [place], PA-C   1 year ago Viral URI   [facility] Caro [place], [place]

## 2022-10-19 LAB — MATERNIT 21 PLUS CORE, BLOOD
Fetal Fraction: 6
Result (T21): NEGATIVE
Trisomy 13 (Patau syndrome): NEGATIVE
Trisomy 18 (Edwards syndrome): NEGATIVE
Trisomy 21 (Down syndrome): NEGATIVE

## 2022-10-19 LAB — CBC/D/PLT+RPR+RH+ABO+RUBIGG...
Antibody Screen: NEGATIVE
Basophils Absolute: 0 10*3/uL (ref 0.0–0.2)
Basos: 0 %
EOS (ABSOLUTE): 0.1 10*3/uL (ref 0.0–0.4)
Eos: 1 %
HCV Ab: NONREACTIVE
HIV Screen 4th Generation wRfx: NONREACTIVE
Hematocrit: 39.4 % (ref 34.0–46.6)
Hemoglobin: 12.9 g/dL (ref 11.1–15.9)
Hepatitis B Surface Ag: NEGATIVE
Immature Grans (Abs): 0 10*3/uL (ref 0.0–0.1)
Immature Granulocytes: 0 %
Lymphocytes Absolute: 1.6 10*3/uL (ref 0.7–3.1)
Lymphs: 20 %
MCH: 29.2 pg (ref 26.6–33.0)
MCHC: 32.7 g/dL (ref 31.5–35.7)
MCV: 89 fL (ref 79–97)
Monocytes Absolute: 0.6 10*3/uL (ref 0.1–0.9)
Monocytes: 7 %
Neutrophils Absolute: 5.7 10*3/uL (ref 1.4–7.0)
Neutrophils: 72 %
Platelets: 286 10*3/uL (ref 150–450)
RBC: 4.42 x10E6/uL (ref 3.77–5.28)
RDW: 13 % (ref 11.7–15.4)
RPR Ser Ql: NONREACTIVE
Rh Factor: POSITIVE
Rubella Antibodies, IGG: <0.9 index — ABNORMAL LOW (ref >0.99)
Varicella zoster IgG: <135 index — ABNORMAL LOW (ref >165)
WBC: 8 10*3/uL (ref 3.4–10.8)

## 2022-10-19 LAB — TOXOPLASMA ANTIBODIES- IGG AND  IGM
Toxoplasma Antibody- IgM: <3 AU/mL (ref 0.0–7.9)
Toxoplasma IgG Ratio: <3 IU/mL (ref 0.0–7.1)

## 2022-10-19 LAB — HCV INTERPRETATION

## 2022-10-21 ENCOUNTER — Other Ambulatory Visit: Payer: Self-pay | Admitting: Obstetrics & Gynecology

## 2022-10-21 ENCOUNTER — Encounter: Payer: Self-pay | Admitting: Obstetrics & Gynecology

## 2022-10-21 DIAGNOSIS — M62838 Other muscle spasm: Secondary | ICD-10-CM | POA: Diagnosis not present

## 2022-10-21 DIAGNOSIS — O2340 Unspecified infection of urinary tract in pregnancy, unspecified trimester: Secondary | ICD-10-CM

## 2022-10-21 DIAGNOSIS — S34109S Unspecified injury to unspecified level of lumbar spinal cord, sequela: Secondary | ICD-10-CM | POA: Diagnosis not present

## 2022-10-21 DIAGNOSIS — Z3A11 11 weeks gestation of pregnancy: Secondary | ICD-10-CM | POA: Diagnosis not present

## 2022-10-21 MED ORDER — NITROFURANTOIN MONOHYD MACRO 100 MG PO CAPS: 100.0000 mg | ORAL_CAPSULE | Freq: Two times a day (BID) | ORAL | 0 refills | Status: DC

## 2022-10-21 NOTE — Progress Notes (Signed)
Enterococcus + UC&S to be treated with macrobid Patient notified via Mychart.

## 2022-10-28 ENCOUNTER — Encounter: Payer: Self-pay | Admitting: Obstetrics & Gynecology

## 2022-10-28 ENCOUNTER — Ambulatory Visit (INDEPENDENT_AMBULATORY_CARE_PROVIDER_SITE_OTHER): Payer: Medicaid Other | Admitting: Obstetrics & Gynecology

## 2022-10-28 VITALS — BP 115/76 | HR 88 | Wt 233.0 lb

## 2022-10-28 DIAGNOSIS — O99210 Obesity complicating pregnancy, unspecified trimester: Secondary | ICD-10-CM | POA: Insufficient documentation

## 2022-10-28 DIAGNOSIS — G8220 Paraplegia, unspecified: Secondary | ICD-10-CM

## 2022-10-28 DIAGNOSIS — Z3491 Encounter for supervision of normal pregnancy, unspecified, first trimester: Secondary | ICD-10-CM

## 2022-10-28 DIAGNOSIS — O99351 Diseases of the nervous system complicating pregnancy, first trimester: Secondary | ICD-10-CM

## 2022-10-28 DIAGNOSIS — O99211 Obesity complicating pregnancy, first trimester: Secondary | ICD-10-CM | POA: Diagnosis not present

## 2022-10-28 DIAGNOSIS — S34123D Incomplete lesion of L3 level of lumbar spinal cord, subsequent encounter: Secondary | ICD-10-CM | POA: Diagnosis not present

## 2022-10-28 DIAGNOSIS — E669 Obesity, unspecified: Secondary | ICD-10-CM

## 2022-10-28 DIAGNOSIS — Z3A12 12 weeks gestation of pregnancy: Secondary | ICD-10-CM

## 2022-10-28 DIAGNOSIS — Z3401 Encounter for supervision of normal first pregnancy, first trimester: Secondary | ICD-10-CM | POA: Insufficient documentation

## 2022-10-28 DIAGNOSIS — S34103S Unspecified injury to L3 level of lumbar spinal cord, sequela: Secondary | ICD-10-CM | POA: Diagnosis not present

## 2022-10-28 DIAGNOSIS — Z3490 Encounter for supervision of normal pregnancy, unspecified, unspecified trimester: Secondary | ICD-10-CM | POA: Insufficient documentation

## 2022-10-28 DIAGNOSIS — O99213 Obesity complicating pregnancy, third trimester: Secondary | ICD-10-CM

## 2022-10-28 HISTORY — DX: Obesity complicating pregnancy, third trimester: O99.213

## 2022-10-28 LAB — POCT URINALYSIS DIPSTICK OB
Bilirubin, UA: NEGATIVE
Blood, UA: NEGATIVE
Glucose, UA: NEGATIVE
Ketones, UA: NEGATIVE
Leukocytes, UA: NEGATIVE
Nitrite, UA: NEGATIVE
Spec Grav, UA: 1.015 (ref 1.010–1.025)
Urobilinogen, UA: 1 E.U./dL
pH, UA: 6.5 (ref 5.0–8.0)

## 2022-10-28 MED ORDER — ASPIRIN 81 MG PO TBEC
81.0000 mg | DELAYED_RELEASE_TABLET | Freq: Every day | ORAL | 12 refills | Status: DC
Start: 2022-10-28 — End: 2023-05-11

## 2022-10-28 NOTE — Addendum Note (Signed)
Addended by: Cornelius Moras D on: 10/28/2022 10:46 AM   Modules accepted: Orders
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
## 2022-10-29 LAB — HEMOGLOBIN A1C
Est. average glucose Bld gHb Est-mCnc: 100 mg/dL
Hgb A1c MFr Bld: 5.1 % (ref 4.8–5.6)

## 2022-10-30 ENCOUNTER — Encounter: Payer: Self-pay | Admitting: Obstetrics & Gynecology

## 2022-10-30 DIAGNOSIS — Z419 Encounter for procedure for purposes other than remedying health state, unspecified: Secondary | ICD-10-CM | POA: Diagnosis not present

## 2022-10-30 NOTE — Addendum Note (Signed)
Addended by: Kathlene Cote on: 10/30/2022 12:37 PM   Modules accepted: Orders

## 2022-11-18 ENCOUNTER — Encounter: Payer: Self-pay | Admitting: Obstetrics

## 2022-11-18 ENCOUNTER — Other Ambulatory Visit (INDEPENDENT_AMBULATORY_CARE_PROVIDER_SITE_OTHER): Payer: Medicaid Other

## 2022-11-18 ENCOUNTER — Ambulatory Visit (INDEPENDENT_AMBULATORY_CARE_PROVIDER_SITE_OTHER): Payer: Medicaid Other | Admitting: Obstetrics

## 2022-11-18 VITALS — BP 114/76 | HR 94 | Wt 229.0 lb

## 2022-11-18 DIAGNOSIS — Z3A15 15 weeks gestation of pregnancy: Secondary | ICD-10-CM

## 2022-11-18 DIAGNOSIS — G8220 Paraplegia, unspecified: Secondary | ICD-10-CM

## 2022-11-18 DIAGNOSIS — G839 Paralytic syndrome, unspecified: Secondary | ICD-10-CM

## 2022-11-18 DIAGNOSIS — Z362 Encounter for other antenatal screening follow-up: Secondary | ICD-10-CM | POA: Diagnosis not present

## 2022-11-18 DIAGNOSIS — O99210 Obesity complicating pregnancy, unspecified trimester: Secondary | ICD-10-CM

## 2022-11-18 DIAGNOSIS — Z3A14 14 weeks gestation of pregnancy: Secondary | ICD-10-CM

## 2022-11-18 DIAGNOSIS — O3680X0 Pregnancy with inconclusive fetal viability, not applicable or unspecified: Secondary | ICD-10-CM | POA: Diagnosis not present

## 2022-11-18 DIAGNOSIS — Z3402 Encounter for supervision of normal first pregnancy, second trimester: Secondary | ICD-10-CM

## 2022-11-18 DIAGNOSIS — O3680X1 Pregnancy with inconclusive fetal viability, fetus 1: Secondary | ICD-10-CM

## 2022-11-18 DIAGNOSIS — O0991 Supervision of high risk pregnancy, unspecified, first trimester: Secondary | ICD-10-CM

## 2022-11-18 DIAGNOSIS — Z3401 Encounter for supervision of normal first pregnancy, first trimester: Secondary | ICD-10-CM

## 2022-11-18 LAB — POCT URINALYSIS DIPSTICK OB
Bilirubin, UA: NEGATIVE
Blood, UA: NEGATIVE
Glucose, UA: NEGATIVE
Ketones, UA: NEGATIVE
Leukocytes, UA: NEGATIVE
Nitrite, UA: NEGATIVE
POC,PROTEIN,UA: NEGATIVE
Spec Grav, UA: 1.02 (ref 1.010–1.025)
Urobilinogen, UA: 4 E.U./dL — AB
pH, UA: 6 (ref 5.0–8.0)

## 2022-11-18 NOTE — Progress Notes (Signed)
ROB at [redacted]w[redacted]d. Angela [name] is feeling well, still having some nausea but it is improving. She is planning on taking CBE and breastfeeding classes with her boyfriend. She is unsure if she can breastfeed while taking gabapentin. Per [employer]act[employer] and MotherToBaby, this medication passes to breastmilk in low doses and is considered safe; parents are encouraged to monitor the baby for drowsiness. Unable to hear FHTs with Doppler today. US showed FHT 141. RTC in 4 weeks. Anatomy scan scheduled for 12/16/22 with MFM.  Glenetta Borg, CNM

## 2022-11-30 DIAGNOSIS — Z419 Encounter for procedure for purposes other than remedying health state, unspecified: Secondary | ICD-10-CM | POA: Diagnosis not present

## 2022-12-02 ENCOUNTER — Encounter: Payer: Medicaid Other | Attending: Obstetrics & Gynecology | Admitting: Dietician

## 2022-12-02 ENCOUNTER — Encounter: Payer: Self-pay | Admitting: Dietician

## 2022-12-02 DIAGNOSIS — Z3A17 17 weeks gestation of pregnancy: Secondary | ICD-10-CM | POA: Insufficient documentation

## 2022-12-02 DIAGNOSIS — O99212 Obesity complicating pregnancy, second trimester: Secondary | ICD-10-CM | POA: Insufficient documentation

## 2022-12-02 DIAGNOSIS — Z713 Dietary counseling and surveillance: Secondary | ICD-10-CM | POA: Insufficient documentation

## 2022-12-02 DIAGNOSIS — O99210 Obesity complicating pregnancy, unspecified trimester: Secondary | ICD-10-CM
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
## 2022-12-02 NOTE — Patient Instructions (Addendum)
Set a reminder in your phone every 3 hours to eat something, Work to have a source of protein with each meal or snack. Try to include nuts or nut butters, greek yogurt popsicles, eggs, beans, breakfast meats.  Pack a lunch or snacks to take with you when are running errands away from the house.  Look into a protein supplement drink such as Premier Protein, Ensure, or Boost. Look for supplements with 25 - 30 g per serving. Drink 1 of these throughout the day as often as you can.  Work to consume fluids regularly throughout the day, as tolerated. Continue drinking your daily liter of water.  Speak with your OBGYN for recommendations about the frequency of vomiting.

## 2022-12-16 ENCOUNTER — Other Ambulatory Visit: Payer: Self-pay

## 2022-12-16 ENCOUNTER — Ambulatory Visit: Payer: Medicaid Other | Attending: Maternal & Fetal Medicine

## 2022-12-16 ENCOUNTER — Ambulatory Visit (INDEPENDENT_AMBULATORY_CARE_PROVIDER_SITE_OTHER): Payer: Medicaid Other | Admitting: Obstetrics

## 2022-12-16 ENCOUNTER — Encounter: Payer: Self-pay | Admitting: Obstetrics

## 2022-12-16 VITALS — BP 106/64 | HR 85 | Wt 226.3 lb

## 2022-12-16 DIAGNOSIS — O358XX0 Maternal care for other (suspected) fetal abnormality and damage, not applicable or unspecified: Secondary | ICD-10-CM

## 2022-12-16 DIAGNOSIS — Z3A19 19 weeks gestation of pregnancy: Secondary | ICD-10-CM | POA: Insufficient documentation

## 2022-12-16 DIAGNOSIS — F1290 Cannabis use, unspecified, uncomplicated: Secondary | ICD-10-CM | POA: Insufficient documentation

## 2022-12-16 DIAGNOSIS — Z3402 Encounter for supervision of normal first pregnancy, second trimester: Secondary | ICD-10-CM

## 2022-12-16 DIAGNOSIS — Z363 Encounter for antenatal screening for malformations: Secondary | ICD-10-CM | POA: Diagnosis not present

## 2022-12-16 DIAGNOSIS — E669 Obesity, unspecified: Secondary | ICD-10-CM | POA: Diagnosis not present

## 2022-12-16 DIAGNOSIS — G8220 Paraplegia, unspecified: Secondary | ICD-10-CM

## 2022-12-16 DIAGNOSIS — O99320 Drug use complicating pregnancy, unspecified trimester: Secondary | ICD-10-CM

## 2022-12-16 DIAGNOSIS — O99212 Obesity complicating pregnancy, second trimester: Secondary | ICD-10-CM

## 2022-12-16 DIAGNOSIS — O99322 Drug use complicating pregnancy, second trimester: Secondary | ICD-10-CM

## 2022-12-16 DIAGNOSIS — O99352 Diseases of the nervous system complicating pregnancy, second trimester: Secondary | ICD-10-CM | POA: Diagnosis not present

## 2022-12-16 HISTORY — DX: Cannabis use, unspecified, uncomplicated: F12.90

## 2022-12-16 HISTORY — DX: Drug use complicating pregnancy, unspecified trimester: O99.320

## 2022-12-16 LAB — POCT URINALYSIS DIPSTICK OB
Bilirubin, UA: NEGATIVE
Blood, UA: NEGATIVE
Glucose, UA: NEGATIVE
Ketones, UA: NEGATIVE
Leukocytes, UA: NEGATIVE
Nitrite, UA: NEGATIVE
POC,PROTEIN,UA: NEGATIVE
Spec Grav, UA: 1.01 (ref 1.010–1.025)
Urobilinogen, UA: 4 E.U./dL — AB
pH, UA: 7.5 (ref 5.0–8.0)

## 2022-12-16 NOTE — Progress Notes (Signed)
Routine Prenatal Care Visit  Subjective  Angela [name] is a 22 y.o. G1P0000 at [redacted]w[redacted]d being seen today for ongoing prenatal care.  She is currently monitored for the following issues for this high-risk pregnancy and has Lumbar spinal cord injury (HCC); Paralysis (HCC); Paraplegia (HCC); Depression, recurrent (HCC); Need for influenza vaccination; Family history of diabetes mellitus (DM); Elevated LDL cholesterol level; Avitaminosis D; High-risk pregnancy in first trimester; Exposure to industrial fumes; Encounter for supervision of normal first pregnancy in first trimester; and Obesity in pregnancy on their problem list.  ----------------------------------------------------------------------------------- Patient reports no complaints.   Contractions: Not present. Vag. Bleeding: None.  Movement: Absent. Leaking Fluid denies.  ----------------------------------------------------------------------------------- The following portions of the patient's history were reviewed and updated as appropriate: allergies, current medications, past family history, past medical history, past social history, past surgical history and problem list. Problem list updated.  Objective  Blood pressure 106/64, pulse 85, weight 226 lb 4.8 oz (102.6 kg), last menstrual period 07/24/2022, unknown if currently breastfeeding. Pregravid weight 240 lb (108.9 kg) Total Weight Gain -13 lb 11.2 oz (-6.214 kg) Urinalysis: Urine Protein Negative  Urine Glucose Negative  Fetal Status:     Movement: Absent     General:  Alert, oriented and cooperative. Patient is in no acute distress.  Skin: Skin is warm and dry. No rash noted.   Cardiovascular: Normal heart rate noted  Respiratory: Normal respiratory effort, no problems with respiration noted  Abdomen: Soft, gravid, appropriate for gestational age. Pain/Pressure: Absent     Pelvic:  Cervical exam deferred        Extremities: Normal range of motion.  Edema: None  Mental Status:  Normal mood and affect. Normal behavior. Normal judgment and thought content.   Assessment   22 y.o. G1P0000 at [redacted]w[redacted]d by  05/12/2023, by Ultrasound presenting for routine prenatal visit Having her anatomy scan today at [facility]  Plan   first Problems (from 09/23/22 to present)     Problem Noted Resolved   Supervision of other normal pregnancy, antepartum 09/23/2022 by Loran Senters, CMA 10/28/2022 by Allie Bossier, MD   Overview Addendum 10/28/2022  9:47 AM by Cornelius Moras, CMA     Clinical Staff Provider  Office Location  [facility] Dating  Not found.  Language  English Anatomy US    Flu Vaccine  UTD Genetic Screen  NIPS:   TDaP vaccine   offer Hgb A1C or  GTT Early : Third trimester :   Covid declines   LAB RESULTS   Rhogam  O/Positive/-- (04/15 1610)  Blood Type O/Positive/-- (04/15 0926)   Feeding Plan breast Antibody Negative (04/15 0926)  Contraception pill Rubella <0.90 (04/15 0926)  Circumcision yes RPR Non Reactive (04/15 0926)   Pediatrician  undecided HBsAg Negative (04/15 0926)   Support Person Eliberto Ivory  HIV Non Reactive (04/15 9604)  Prenatal Classes yes Varicella Non immune     GBS  (For PCN allergy, check sensitivities)   BTL Consent  Hep C Non Reactive (04/15 0926)   VBAC Consent  Pap Diagnosis  Date Value Ref Range Status  08/28/2020   Final   - Negative for intraepithelial lesion or malignancy (NILM)      Hgb Electro      CF      SMA                   Preterm labor symptoms and general obstetric precautions including but not limited to vaginal bleeding, contractions, leaking of fluid  and fetal movement were reviewed in detail with the patient. Please refer to After Visit Summary for other counseling recommendations.  She is having her anatomy scan today. The family asked about waterbirth.  Return in about 4 weeks (around 01/13/2023) for return OB.  Mirna Mires, CNM  12/16/2022 9:24 AM

## 2022-12-17 ENCOUNTER — Other Ambulatory Visit: Payer: Self-pay

## 2022-12-17 ENCOUNTER — Emergency Department (HOSPITAL_COMMUNITY)
Admission: EM | Admit: 2022-12-17 | Discharge: 2022-12-17 | Disposition: A | Discharge status: Home / Self Care | Payer: Medicaid Other | Attending: Emergency Medicine | Admitting: Emergency Medicine

## 2022-12-17 DIAGNOSIS — Z3A19 19 weeks gestation of pregnancy: Secondary | ICD-10-CM | POA: Diagnosis not present

## 2022-12-17 DIAGNOSIS — O99891 Other specified diseases and conditions complicating pregnancy: Secondary | ICD-10-CM | POA: Diagnosis not present

## 2022-12-17 DIAGNOSIS — R531 Weakness: Secondary | ICD-10-CM | POA: Diagnosis not present

## 2022-12-17 DIAGNOSIS — O26892 Other specified pregnancy related conditions, second trimester: Secondary | ICD-10-CM | POA: Diagnosis not present

## 2022-12-17 DIAGNOSIS — G8929 Other chronic pain: Secondary | ICD-10-CM | POA: Insufficient documentation

## 2022-12-17 DIAGNOSIS — M545 Low back pain, unspecified: Secondary | ICD-10-CM | POA: Diagnosis not present

## 2022-12-17 DIAGNOSIS — M549 Dorsalgia, unspecified: Secondary | ICD-10-CM | POA: Diagnosis not present

## 2022-12-17 LAB — URINALYSIS, ROUTINE W REFLEX MICROSCOPIC
Bilirubin Urine: NEGATIVE
Glucose, UA: NEGATIVE mg/dL
Hgb urine dipstick: NEGATIVE
Ketones, ur: NEGATIVE mg/dL
Leukocytes,Ua: NEGATIVE
Nitrite: NEGATIVE
Protein, ur: NEGATIVE mg/dL
Specific Gravity, Urine: 1.014 (ref 1.005–1.030)
pH: 6 (ref 5.0–8.0)

## 2022-12-17 MED ORDER — OXYCODONE HCL 5 MG PO TABS: 2.5000 mg | ORAL_TABLET | ORAL | 0 refills | Status: DC | PRN

## 2022-12-17 MED ORDER — OXYCODONE HCL 5 MG PO TABS
5.0000 mg | ORAL_TABLET | ORAL | Status: DC | PRN
  Administered 2022-12-17: 5 mg via ORAL
  Filled 2022-12-17: qty 1

## 2022-12-17 NOTE — Discharge Instructions (Signed)
Follow up asap with your gyn  SEEK IMMEDIATE MEDICAL ATTENTION IF: New numbness, tingling, weakness, or problem with the use of your arms or legs.  Severe back pain not relieved with medications.  Change in bowel or bladder control.  Increasing pain in any areas of the body (such as chest or abdominal pain).  Shortness of breath, dizziness or fainting.  Nausea (feeling sick to your stomach), vomiting, fever, or sweats.

## 2022-12-17 NOTE — ED Triage Notes (Signed)
Pt via POV c/o severe lower back pain, unimproved with tylenol. Pt has hx nerve damage and this feels different. Pt is [redacted] weeks pregnant. No known injury. Pain currently rated 8/10 and pt is usually in a wheelchair due to mobility issues.
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
## 2022-12-30 DIAGNOSIS — Z419 Encounter for procedure for purposes other than remedying health state, unspecified: Secondary | ICD-10-CM | POA: Diagnosis not present

## 2023-01-06 ENCOUNTER — Other Ambulatory Visit: Payer: Self-pay | Admitting: Obstetrics

## 2023-01-06 ENCOUNTER — Encounter: Payer: Self-pay | Admitting: Obstetrics

## 2023-01-06 DIAGNOSIS — O0991 Supervision of high risk pregnancy, unspecified, first trimester: Secondary | ICD-10-CM

## 2023-01-13 ENCOUNTER — Ambulatory Visit (INDEPENDENT_AMBULATORY_CARE_PROVIDER_SITE_OTHER): Payer: Medicaid Other | Admitting: Obstetrics

## 2023-01-13 VITALS — BP 106/74 | HR 87 | Wt 226.0 lb

## 2023-01-13 DIAGNOSIS — Z3A23 23 weeks gestation of pregnancy: Secondary | ICD-10-CM

## 2023-01-13 DIAGNOSIS — Z3401 Encounter for supervision of normal first pregnancy, first trimester: Secondary | ICD-10-CM

## 2023-01-13 DIAGNOSIS — O99210 Obesity complicating pregnancy, unspecified trimester: Secondary | ICD-10-CM

## 2023-01-13 DIAGNOSIS — O99891 Other specified diseases and conditions complicating pregnancy: Secondary | ICD-10-CM

## 2023-01-13 DIAGNOSIS — Z3402 Encounter for supervision of normal first pregnancy, second trimester: Secondary | ICD-10-CM

## 2023-01-13 DIAGNOSIS — F1290 Cannabis use, unspecified, uncomplicated: Secondary | ICD-10-CM

## 2023-01-13 DIAGNOSIS — G839 Paralytic syndrome, unspecified: Secondary | ICD-10-CM

## 2023-01-13 DIAGNOSIS — G8220 Paraplegia, unspecified: Secondary | ICD-10-CM

## 2023-01-13 LAB — POCT URINALYSIS DIPSTICK OB
Bilirubin, UA: NEGATIVE
Blood, UA: NEGATIVE
Glucose, UA: NEGATIVE
Ketones, UA: NEGATIVE
Leukocytes, UA: NEGATIVE
Nitrite, UA: NEGATIVE
POC,PROTEIN,UA: NEGATIVE
Spec Grav, UA: 1.015 (ref 1.010–1.025)
Urobilinogen, UA: 0.2 E.U./dL
pH, UA: 6.5 (ref 5.0–8.0)

## 2023-01-13 NOTE — Assessment & Plan Note (Signed)
-  Anticipatory guidance about the second trimester -Discussed 28-week labs at next visit and preparation for glucose test -Referral sent to chiropractor and PT for back pain -MFM [place] to f/u on anatomy scheduled for 01/20/23

## 2023-01-13 NOTE — Progress Notes (Signed)
    Return Prenatal Note   Assessment/Plan   Plan  22 y.o. G1P0000 at [redacted]w[redacted]d presents for follow-up OB visit. Reviewed prenatal record including previous visit note.  Encounter for supervision of normal first pregnancy in first trimester -Anticipatory guidance about the second trimester -Discussed 28-week labs at next visit and preparation for glucose test -Referral sent to chiropractor and PT for back pain -MFM [place] to f/u on anatomy scheduled for 01/20/23    Orders Placed This Encounter  Procedures   Ambulatory referral to Chiropractic    Referral Priority:   Routine    Referral Type:   Chiropractic    Referral Reason:   Specialty Services Required    Requested Specialty:   Chiropractic Medicine    Number of Visits Requested:   1   Ambulatory referral to Physical Therapy    Referral Priority:   Routine    Referral Type:   Physical Medicine    Referral Reason:   Specialty Services Required    Requested Specialty:   Physical Therapy    Number of Visits Requested:   1   POC Urinalysis Dipstick OB   Return in about 4 weeks (around 02/10/2023).   Future Appointments  Date Time Provider Department Center  01/20/2023 11:00 AM [facility]    For next visit:  ROB with 1 hour gluocla, third trimester labs, and Tdap     Subjective   [name] at [redacted]w[redacted]d presents for this follow-up prenatal visit.  Angela [name] is starting to feel fetal movement! She was in the ED last month for severe back pain. She was given a small rx for narcotic pain medication and encouraged to seek a referral for chiropractor and/or PT. Her pain has not been as severe recently. She feels like her appetite and diet have improved. She had been drinking protein shakes at the recommendation of nutritionist.  Patient reports: Movement: Present Contractions: Not present  Objective   Flow sheet Vitals: Pulse Rate: 87 BP: 106/74 Fundal Height: 25 cm Fetal Heart Rate (bpm): 151 Total  weight gain: -14 lb (-6.35 kg)  General Appearance  No acute distress, well appearing, and well nourished Pulmonary   Normal work of breathing Neurologic   Alert and oriented to person, place, and time Psychiatric   Mood and affect within normal limits  Glenetta Borg, CNM  01/13/23 10:16 AM

## 2023-01-14 NOTE — Addendum Note (Signed)
Addended by: Kathlene Cote on: 01/14/2023 09:58 AM   Modules accepted: Orders

## 2023-01-15 ENCOUNTER — Other Ambulatory Visit: Payer: Self-pay

## 2023-01-15 DIAGNOSIS — O99320 Drug use complicating pregnancy, unspecified trimester: Secondary | ICD-10-CM

## 2023-01-15 DIAGNOSIS — O99210 Obesity complicating pregnancy, unspecified trimester: Secondary | ICD-10-CM

## 2023-01-15 DIAGNOSIS — G8220 Paraplegia, unspecified: Secondary | ICD-10-CM

## 2023-01-20 ENCOUNTER — Other Ambulatory Visit: Payer: Self-pay

## 2023-01-20 ENCOUNTER — Ambulatory Visit: Payer: Medicaid Other | Attending: Maternal & Fetal Medicine

## 2023-01-20 DIAGNOSIS — O99212 Obesity complicating pregnancy, second trimester: Secondary | ICD-10-CM

## 2023-01-20 DIAGNOSIS — G8220 Paraplegia, unspecified: Secondary | ICD-10-CM | POA: Diagnosis not present

## 2023-01-20 DIAGNOSIS — M9905 Segmental and somatic dysfunction of pelvic region: Secondary | ICD-10-CM | POA: Diagnosis not present

## 2023-01-20 DIAGNOSIS — O99320 Drug use complicating pregnancy, unspecified trimester: Secondary | ICD-10-CM

## 2023-01-20 DIAGNOSIS — M62830 Muscle spasm of back: Secondary | ICD-10-CM | POA: Diagnosis not present

## 2023-01-20 DIAGNOSIS — M5416 Radiculopathy, lumbar region: Secondary | ICD-10-CM | POA: Diagnosis not present

## 2023-01-20 DIAGNOSIS — R519 Headache, unspecified: Secondary | ICD-10-CM | POA: Diagnosis not present

## 2023-01-20 DIAGNOSIS — E669 Obesity, unspecified: Secondary | ICD-10-CM

## 2023-01-20 DIAGNOSIS — O99322 Drug use complicating pregnancy, second trimester: Secondary | ICD-10-CM

## 2023-01-20 DIAGNOSIS — O99210 Obesity complicating pregnancy, unspecified trimester: Secondary | ICD-10-CM

## 2023-01-20 DIAGNOSIS — M9901 Segmental and somatic dysfunction of cervical region: Secondary | ICD-10-CM | POA: Diagnosis not present

## 2023-01-20 DIAGNOSIS — O99891 Other specified diseases and conditions complicating pregnancy: Secondary | ICD-10-CM

## 2023-01-20 DIAGNOSIS — O358XX0 Maternal care for other (suspected) fetal abnormality and damage, not applicable or unspecified: Secondary | ICD-10-CM

## 2023-01-20 DIAGNOSIS — M9902 Segmental and somatic dysfunction of thoracic region: Secondary | ICD-10-CM | POA: Diagnosis not present

## 2023-01-20 DIAGNOSIS — M542 Cervicalgia: Secondary | ICD-10-CM | POA: Diagnosis not present

## 2023-01-20 DIAGNOSIS — Z3A24 24 weeks gestation of pregnancy: Secondary | ICD-10-CM

## 2023-01-20 DIAGNOSIS — M5417 Radiculopathy, lumbosacral region: Secondary | ICD-10-CM | POA: Diagnosis not present

## 2023-01-20 DIAGNOSIS — F1290 Cannabis use, unspecified, uncomplicated: Secondary | ICD-10-CM

## 2023-01-20 DIAGNOSIS — M9903 Segmental and somatic dysfunction of lumbar region: Secondary | ICD-10-CM | POA: Diagnosis not present

## 2023-01-22 DIAGNOSIS — M5417 Radiculopathy, lumbosacral region: Secondary | ICD-10-CM | POA: Diagnosis not present

## 2023-01-22 DIAGNOSIS — M5416 Radiculopathy, lumbar region: Secondary | ICD-10-CM | POA: Diagnosis not present

## 2023-01-22 DIAGNOSIS — M9903 Segmental and somatic dysfunction of lumbar region: Secondary | ICD-10-CM | POA: Diagnosis not present

## 2023-01-22 DIAGNOSIS — M9905 Segmental and somatic dysfunction of pelvic region: Secondary | ICD-10-CM | POA: Diagnosis not present

## 2023-01-22 DIAGNOSIS — M9901 Segmental and somatic dysfunction of cervical region: Secondary | ICD-10-CM | POA: Diagnosis not present

## 2023-01-22 DIAGNOSIS — M542 Cervicalgia: Secondary | ICD-10-CM | POA: Diagnosis not present

## 2023-01-22 DIAGNOSIS — R519 Headache, unspecified: Secondary | ICD-10-CM | POA: Diagnosis not present

## 2023-01-22 DIAGNOSIS — M62830 Muscle spasm of back: Secondary | ICD-10-CM | POA: Diagnosis not present

## 2023-01-22 DIAGNOSIS — M9902 Segmental and somatic dysfunction of thoracic region: Secondary | ICD-10-CM | POA: Diagnosis not present

## 2023-01-24 ENCOUNTER — Other Ambulatory Visit: Payer: Self-pay | Admitting: Family Medicine

## 2023-01-24 DIAGNOSIS — Z1159 Encounter for screening for other viral diseases: Secondary | ICD-10-CM

## 2023-01-24 DIAGNOSIS — S34109S Unspecified injury to unspecified level of lumbar spinal cord, sequela: Secondary | ICD-10-CM

## 2023-01-24 DIAGNOSIS — Z23 Encounter for immunization: Secondary | ICD-10-CM

## 2023-01-24 DIAGNOSIS — F419 Anxiety disorder, unspecified: Secondary | ICD-10-CM

## 2023-01-24 DIAGNOSIS — F339 Major depressive disorder, recurrent, unspecified: Secondary | ICD-10-CM

## 2023-01-24 DIAGNOSIS — G839 Paralytic syndrome, unspecified: Secondary | ICD-10-CM

## 2023-01-24 DIAGNOSIS — M62838 Other muscle spasm: Secondary | ICD-10-CM

## 2023-01-24 DIAGNOSIS — G8220 Paraplegia, unspecified: Secondary | ICD-10-CM

## 2023-01-24 DIAGNOSIS — E559 Vitamin D deficiency, unspecified: Secondary | ICD-10-CM

## 2023-01-24 DIAGNOSIS — M792 Neuralgia and neuritis, unspecified: Secondary | ICD-10-CM

## 2023-01-24 NOTE — Telephone Encounter (Signed)
Requested medication (s) are due for refill today - yes  Requested medication (s) are on the active medication list -yes  Future visit scheduled -no  Last refill: 10/18/22 #720  Notes to clinic: sent for review- patient pregnant and risk warning alert  with RF  Requested Prescriptions  Pending Prescriptions Disp Refills   gabapentin (NEURONTIN) 400 MG capsule [Pharmacy Med Name: Gabapentin 400 MG Oral Capsule] 720 capsule 0    Sig: TAKE 2 CAPSULES BY MOUTH 4 TIMES DAILY     Neurology: Anticonvulsants - gabapentin Passed - 01/24/2023  9:24 AM      Passed - Cr in normal range and within 360 days    Creat  Date Value Ref Range Status  02/17/2018 0.56 0.50 - 1.00 mg/dL Final   Creatinine, Ser  Date Value Ref Range Status  07/04/2022 0.63 0.57 - 1.00 mg/dL Final         Passed - Completed PHQ-2 or PHQ-9 in the last 360 days      Passed - Valid encounter within last 12 months    Recent Outpatient Visits           3 months ago High-risk pregnancy in first trimester   [facility] Jacky Kindle, FNP   6 months ago Annual physical exam   [facility] Merita Norton T, FNP   1 year ago Depression, recurrent [facility])   [facility] Merita Norton T, FNP   1 year ago Rash   [facility] [place], [place], PA-C   1 year ago Viral URI   [facility] [place], [place]                 Requested Prescriptions  Pending Prescriptions Disp Refills   gabapentin (NEURONTIN) 400 MG capsule [Pharmacy Med Name: Gabapentin 400 MG Oral Capsule] 720 capsule 0    Sig: TAKE 2 CAPSULES BY MOUTH 4 TIMES DAILY     Neurology: Anticonvulsants - gabapentin Passed - 01/24/2023  9:24 AM      Passed - Cr in normal range and within 360 days    Creat  Date Value Ref Range Status  02/17/2018 0.56 0.50 - 1.00 mg/dL Final   Creatinine, Ser  Date Value Ref Range Status   07/04/2022 0.63 0.57 - 1.00 mg/dL Final         Passed - Completed PHQ-2 or PHQ-9 in the last 360 days      Passed - Valid encounter within last 12 months    Recent Outpatient Visits           3 months ago High-risk pregnancy in first trimester   [facility] Jacky Kindle, FNP   6 months ago Annual physical exam   [facility] Jacky Kindle, FNP   1 year ago Depression, recurrent [facility])   [facility] Jacky Kindle, FNP   1 year ago Rash   [facility] [place], [place], PA-C   1 year ago Viral URI   [facility] Caro [place], [place]

## 2023-01-30 DIAGNOSIS — Z419 Encounter for procedure for purposes other than remedying health state, unspecified: Secondary | ICD-10-CM | POA: Diagnosis not present

## 2023-02-06 ENCOUNTER — Ambulatory Visit (HOSPITAL_COMMUNITY): Payer: Medicaid Other

## 2023-02-07 ENCOUNTER — Other Ambulatory Visit: Payer: Self-pay

## 2023-02-07 DIAGNOSIS — Z113 Encounter for screening for infections with a predominantly sexual mode of transmission: Secondary | ICD-10-CM

## 2023-02-07 DIAGNOSIS — D649 Anemia, unspecified: Secondary | ICD-10-CM

## 2023-02-07 DIAGNOSIS — Z131 Encounter for screening for diabetes mellitus: Secondary | ICD-10-CM

## 2023-02-10 ENCOUNTER — Other Ambulatory Visit: Payer: Medicaid Other

## 2023-02-10 ENCOUNTER — Ambulatory Visit (INDEPENDENT_AMBULATORY_CARE_PROVIDER_SITE_OTHER): Payer: Medicaid Other | Admitting: Certified Nurse Midwife

## 2023-02-10 ENCOUNTER — Encounter: Payer: Self-pay | Admitting: Certified Nurse Midwife

## 2023-02-10 VITALS — BP 106/71 | HR 94 | Wt 205.0 lb

## 2023-02-10 DIAGNOSIS — Z23 Encounter for immunization: Secondary | ICD-10-CM | POA: Diagnosis not present

## 2023-02-10 DIAGNOSIS — Z131 Encounter for screening for diabetes mellitus: Secondary | ICD-10-CM

## 2023-02-10 DIAGNOSIS — M9905 Segmental and somatic dysfunction of pelvic region: Secondary | ICD-10-CM | POA: Diagnosis not present

## 2023-02-10 DIAGNOSIS — Z113 Encounter for screening for infections with a predominantly sexual mode of transmission: Secondary | ICD-10-CM | POA: Diagnosis not present

## 2023-02-10 DIAGNOSIS — M9901 Segmental and somatic dysfunction of cervical region: Secondary | ICD-10-CM | POA: Diagnosis not present

## 2023-02-10 DIAGNOSIS — M9902 Segmental and somatic dysfunction of thoracic region: Secondary | ICD-10-CM | POA: Diagnosis not present

## 2023-02-10 DIAGNOSIS — R519 Headache, unspecified: Secondary | ICD-10-CM | POA: Diagnosis not present

## 2023-02-10 DIAGNOSIS — M5416 Radiculopathy, lumbar region: Secondary | ICD-10-CM | POA: Diagnosis not present

## 2023-02-10 DIAGNOSIS — D649 Anemia, unspecified: Secondary | ICD-10-CM | POA: Diagnosis not present

## 2023-02-10 DIAGNOSIS — Z3483 Encounter for supervision of other normal pregnancy, third trimester: Secondary | ICD-10-CM

## 2023-02-10 DIAGNOSIS — M62830 Muscle spasm of back: Secondary | ICD-10-CM | POA: Diagnosis not present

## 2023-02-10 DIAGNOSIS — M5417 Radiculopathy, lumbosacral region: Secondary | ICD-10-CM | POA: Diagnosis not present

## 2023-02-10 DIAGNOSIS — M542 Cervicalgia: Secondary | ICD-10-CM | POA: Diagnosis not present

## 2023-02-10 DIAGNOSIS — M9903 Segmental and somatic dysfunction of lumbar region: Secondary | ICD-10-CM | POA: Diagnosis not present

## 2023-02-10 NOTE — Patient Instructions (Signed)
 Diphtheria; Tetanus; Pertussis (DTaP or Tdap) Vaccine Injection What is this medication? DIPHTHERIA; TETANUS; PERTUSSIS VACCINE (dif THEER ee uh; TET n [place]; per TUS iss VAK seen) reduces the risk of diphtheria, tetanus (lockjaw), and pertussis (whooping cough). It does not treat diphtheria, tetanus, or pertussis. It is still possible to get diphtheria, tetanus, or pertussis after receiving this vaccine, but the symptoms may be less severe or not last as long. It works by helping your immune system learn how to fight off a future infection. This medicine may be used for other purposes; ask your health care provider or pharmacist if you have questions. COMMON BRAND NAME(S): Adacel, Boostrix, Certiva, Daptacel, Infanrix, Tripedia What should I tell my care team before I take this medication? They need to know if you have any of these conditions: Blood disorders, such as hemophilia Fever or infection Immune system problems Neurologic disease Seizures An unusual or allergic reaction to other vaccines, latex, other medications, foods, dyes, or preservatives Pregnant or trying to get pregnant Breastfeeding How should I use this medication? This vaccine is injected into a muscle. It is given by your care team. A copy of Vaccine Information Statements will be given before each vaccination. Be sure to read this information carefully each time. This sheet may change often. Talk to your care team about the use of this medication in children. While the DTaP vaccine may be given to children as young as 6 weeks and the Tdap vaccine may be given to children as young as 36 years old, precautions do apply. Overdosage: If you think you have taken too much of this medicine contact a poison control center or emergency room at once. NOTE: This medicine is only for you. Do not share this medicine with others. What if I miss a dose? It is important not to miss your dose. Call your care team if you are unable to keep an  appointment. What may interact with this medication? This medication may interact with the following: Certain medications that prevent or treat blood clots, such as warfarin, enoxaparin, dalteparin Immune globulin Medications that lower your chance of fighting an infection, such as adalimumab, anakinra, infliximab Medications to treat cancer Steroid medications, such as prednisone or cortisone This list may not describe all possible interactions. Give your health care provider a list of all the medicines, herbs, non-prescription drugs, or dietary supplements you use. Also tell them if you smoke, drink alcohol, or use illegal drugs. Some items may interact with your medicine. What should I watch for while using this medication? See your care team for all shots of this vaccine as directed. Report any side effects to your care team right away. This vaccine, like all vaccines, may not fully protect everyone. What side effects may I notice from receiving this medication? Side effects that you should report to your care team as soon as possible: Allergic reactions--skin rash, itching, hives, swelling of the face, lips, tongue, or throat Feeling faint or lightheaded Side effects that usually do not require medical attention (report these to your care team if they continue or are bothersome): Chills Fever General discomfort and fatigue Headache Joint pain Muscle pain Pain, redness, or irritation at injection site This list may not describe all possible side effects. Call your doctor for medical advice about side effects. You may report side effects to FDA at 1-800-FDA-1088. Where should I keep my medication? This vaccine is only given by your care team. It will not be stored at home. NOTE: This  sheet is a summary. It may not cover all possible information. If you have questions about this medicine, talk to your doctor, pharmacist, or health care provider.  2024 Elsevier/Gold Standard  (2021-12-17 00:00:00)

## 2023-02-10 NOTE — Addendum Note (Signed)
Addended by: Fonda Kinder on: 02/10/2023 10:17 AM   Modules accepted: Orders

## 2023-02-10 NOTE — Progress Notes (Signed)
ROB doing well. Feels good movement. 28 wk labs today: Glucose screen/RPR/CBC. Tdap, Blood transfusion consent completed, all questions answered. Sample birth plan given, will follow up in upcoming visits. Discussed birth control after delivery, information pamphlet given. Planning on pill   Follow up 2 wk with Marcelino Duster for [facility] or sooner if needed.    Doreene Burke, CNM

## 2023-02-11 ENCOUNTER — Encounter: Payer: Self-pay | Admitting: Obstetrics

## 2023-02-12 DIAGNOSIS — M62830 Muscle spasm of back: Secondary | ICD-10-CM | POA: Diagnosis not present

## 2023-02-12 DIAGNOSIS — M9902 Segmental and somatic dysfunction of thoracic region: Secondary | ICD-10-CM | POA: Diagnosis not present

## 2023-02-12 DIAGNOSIS — M5416 Radiculopathy, lumbar region: Secondary | ICD-10-CM | POA: Diagnosis not present

## 2023-02-12 DIAGNOSIS — R519 Headache, unspecified: Secondary | ICD-10-CM | POA: Diagnosis not present

## 2023-02-12 DIAGNOSIS — M542 Cervicalgia: Secondary | ICD-10-CM | POA: Diagnosis not present

## 2023-02-12 DIAGNOSIS — M9901 Segmental and somatic dysfunction of cervical region: Secondary | ICD-10-CM | POA: Diagnosis not present

## 2023-02-12 DIAGNOSIS — M5417 Radiculopathy, lumbosacral region: Secondary | ICD-10-CM | POA: Diagnosis not present

## 2023-02-12 DIAGNOSIS — M9905 Segmental and somatic dysfunction of pelvic region: Secondary | ICD-10-CM | POA: Diagnosis not present

## 2023-02-12 DIAGNOSIS — M9903 Segmental and somatic dysfunction of lumbar region: Secondary | ICD-10-CM | POA: Diagnosis not present

## 2023-02-17 ENCOUNTER — Ambulatory Visit (HOSPITAL_COMMUNITY): Payer: Medicaid Other

## 2023-02-19 ENCOUNTER — Ambulatory Visit: Payer: Medicaid Other | Attending: Maternal & Fetal Medicine

## 2023-02-19 ENCOUNTER — Other Ambulatory Visit: Payer: Self-pay

## 2023-02-19 VITALS — BP 118/71 | HR 106 | Temp 98.2°F | Ht 66.0 in

## 2023-02-19 DIAGNOSIS — M542 Cervicalgia: Secondary | ICD-10-CM | POA: Diagnosis not present

## 2023-02-19 DIAGNOSIS — O99213 Obesity complicating pregnancy, third trimester: Secondary | ICD-10-CM | POA: Diagnosis not present

## 2023-02-19 DIAGNOSIS — G8220 Paraplegia, unspecified: Secondary | ICD-10-CM | POA: Diagnosis not present

## 2023-02-19 DIAGNOSIS — M9902 Segmental and somatic dysfunction of thoracic region: Secondary | ICD-10-CM | POA: Diagnosis not present

## 2023-02-19 DIAGNOSIS — Z362 Encounter for other antenatal screening follow-up: Secondary | ICD-10-CM | POA: Diagnosis not present

## 2023-02-19 DIAGNOSIS — M5417 Radiculopathy, lumbosacral region: Secondary | ICD-10-CM | POA: Diagnosis not present

## 2023-02-19 DIAGNOSIS — F1290 Cannabis use, unspecified, uncomplicated: Secondary | ICD-10-CM | POA: Insufficient documentation

## 2023-02-19 DIAGNOSIS — S34109S Unspecified injury to unspecified level of lumbar spinal cord, sequela: Secondary | ICD-10-CM | POA: Diagnosis not present

## 2023-02-19 DIAGNOSIS — O2690 Pregnancy related conditions, unspecified, unspecified trimester: Secondary | ICD-10-CM | POA: Diagnosis not present

## 2023-02-19 DIAGNOSIS — E669 Obesity, unspecified: Secondary | ICD-10-CM

## 2023-02-19 DIAGNOSIS — O358XX0 Maternal care for other (suspected) fetal abnormality and damage, not applicable or unspecified: Secondary | ICD-10-CM | POA: Diagnosis not present

## 2023-02-19 DIAGNOSIS — O99320 Drug use complicating pregnancy, unspecified trimester: Secondary | ICD-10-CM | POA: Insufficient documentation

## 2023-02-19 DIAGNOSIS — X58XXXS Exposure to other specified factors, sequela: Secondary | ICD-10-CM | POA: Insufficient documentation

## 2023-02-19 DIAGNOSIS — M9903 Segmental and somatic dysfunction of lumbar region: Secondary | ICD-10-CM | POA: Diagnosis not present

## 2023-02-19 DIAGNOSIS — M9901 Segmental and somatic dysfunction of cervical region: Secondary | ICD-10-CM | POA: Diagnosis not present

## 2023-02-19 DIAGNOSIS — M5416 Radiculopathy, lumbar region: Secondary | ICD-10-CM | POA: Diagnosis not present

## 2023-02-19 DIAGNOSIS — O99210 Obesity complicating pregnancy, unspecified trimester: Secondary | ICD-10-CM

## 2023-02-19 DIAGNOSIS — O99893 Other specified diseases and conditions complicating puerperium: Secondary | ICD-10-CM

## 2023-02-19 DIAGNOSIS — R519 Headache, unspecified: Secondary | ICD-10-CM | POA: Diagnosis not present

## 2023-02-19 DIAGNOSIS — M9905 Segmental and somatic dysfunction of pelvic region: Secondary | ICD-10-CM | POA: Diagnosis not present

## 2023-02-19 DIAGNOSIS — M62830 Muscle spasm of back: Secondary | ICD-10-CM | POA: Diagnosis not present

## 2023-02-19 DIAGNOSIS — Z3A28 28 weeks gestation of pregnancy: Secondary | ICD-10-CM | POA: Diagnosis not present

## 2023-02-24 ENCOUNTER — Ambulatory Visit (INDEPENDENT_AMBULATORY_CARE_PROVIDER_SITE_OTHER): Payer: Medicaid Other

## 2023-02-24 VITALS — BP 105/67 | HR 96 | Wt 228.5 lb

## 2023-02-24 DIAGNOSIS — S34109S Unspecified injury to unspecified level of lumbar spinal cord, sequela: Secondary | ICD-10-CM

## 2023-02-24 DIAGNOSIS — Z3A29 29 weeks gestation of pregnancy: Secondary | ICD-10-CM

## 2023-02-24 DIAGNOSIS — Z3403 Encounter for supervision of normal first pregnancy, third trimester: Secondary | ICD-10-CM

## 2023-02-24 DIAGNOSIS — Z3401 Encounter for supervision of normal first pregnancy, first trimester: Secondary | ICD-10-CM

## 2023-02-24 NOTE — Assessment & Plan Note (Signed)
-   Desires natural birth without epidural. Reviewed other pain management options. Given history of spinal cord injury, anesthesia consult placed. - Provided with CBE resources available through [facility].  - Reviewed kick counts and preterm labor warning signs. Instructed to call office or come to hospital with persistent headache, vision changes, regular contractions, leaking of fluid, decreased fetal movement or vaginal bleeding.

## 2023-02-24 NOTE — Progress Notes (Signed)
    Return Prenatal Note   Assessment/Plan   Plan  22 y.o. G1P0000 at [redacted]w[redacted]d presents for follow-up OB visit. Reviewed prenatal record including previous visit note.  Encounter for supervision of normal first pregnancy in first trimester - Desires natural birth without epidural. Reviewed other pain management options. Given history of spinal cord injury, anesthesia consult placed. - Provided with CBE resources available through [facility].  - Reviewed kick counts and preterm labor warning signs. Instructed to call office or come to hospital with persistent headache, vision changes, regular contractions, leaking of fluid, decreased fetal movement or vaginal bleeding.   Orders Placed This Encounter  Procedures   Ambulatory referral to Anesthesiology    Referral Priority:   Routine    Referral Type:   Surgical    Referral Reason:   Specialty Services Required    Requested Specialty:   Anesthesiology    Number of Visits Requested:   1   Return in about 2 weeks (around 03/10/2023) for ROB.   Future Appointments  Date Time Provider Department Center  02/26/2023 11:15 AM Seymour Bars, PT [facility] None  03/10/2023  9:35 AM Tresea Mall, CNM [facility] None  03/19/2023  2:00 PM [facility]  04/02/2023 11:00 AM [facility]  04/09/2023 11:00 AM [facility]  04/16/2023 11:00 AM [facility]    For next visit:  continue with routine prenatal care     Subjective   22 y.o. G1P0000 at [redacted]w[redacted]d presents for this follow-up prenatal visit.  Patient has questions about pain management options in labor and childbirth education classes.  Patient reports: Movement: Present Contractions: Not present  Objective   Flow sheet Vitals: Pulse Rate: 96 BP: 105/67 Fundal Height: 29 cm Fetal Heart Rate (bpm): 150 Total weight gain: -11 lb 8 oz (-5.216 kg)  General Appearance  No acute distress, well appearing, and well  nourished Pulmonary   Normal work of breathing Neurologic   Alert and oriented to person, place, and time Psychiatric   Mood and affect within normal limits  Jalaysha Graybeal Rae Sutcliffe, CNM  08/26/244:27 PM

## 2023-02-26 ENCOUNTER — Ambulatory Visit (HOSPITAL_COMMUNITY): Payer: Medicaid Other | Attending: Obstetrics

## 2023-02-26 ENCOUNTER — Other Ambulatory Visit: Payer: Self-pay

## 2023-02-26 DIAGNOSIS — M9902 Segmental and somatic dysfunction of thoracic region: Secondary | ICD-10-CM | POA: Diagnosis not present

## 2023-02-26 DIAGNOSIS — M5431 Sciatica, right side: Secondary | ICD-10-CM | POA: Diagnosis not present

## 2023-02-26 DIAGNOSIS — M5459 Other low back pain: Secondary | ICD-10-CM | POA: Diagnosis not present

## 2023-02-26 DIAGNOSIS — M9903 Segmental and somatic dysfunction of lumbar region: Secondary | ICD-10-CM | POA: Diagnosis not present

## 2023-02-26 DIAGNOSIS — M546 Pain in thoracic spine: Secondary | ICD-10-CM | POA: Diagnosis not present

## 2023-02-26 DIAGNOSIS — M542 Cervicalgia: Secondary | ICD-10-CM | POA: Diagnosis not present

## 2023-02-26 DIAGNOSIS — M9905 Segmental and somatic dysfunction of pelvic region: Secondary | ICD-10-CM | POA: Diagnosis not present

## 2023-02-26 DIAGNOSIS — M62830 Muscle spasm of back: Secondary | ICD-10-CM | POA: Diagnosis not present

## 2023-02-26 DIAGNOSIS — M5416 Radiculopathy, lumbar region: Secondary | ICD-10-CM | POA: Diagnosis not present

## 2023-02-26 DIAGNOSIS — M549 Dorsalgia, unspecified: Secondary | ICD-10-CM | POA: Diagnosis not present

## 2023-02-26 DIAGNOSIS — M5417 Radiculopathy, lumbosacral region: Secondary | ICD-10-CM | POA: Diagnosis not present

## 2023-02-26 DIAGNOSIS — O99891 Other specified diseases and conditions complicating pregnancy: Secondary | ICD-10-CM | POA: Diagnosis not present

## 2023-02-26 DIAGNOSIS — R519 Headache, unspecified: Secondary | ICD-10-CM | POA: Diagnosis not present

## 2023-02-26 DIAGNOSIS — M9901 Segmental and somatic dysfunction of cervical region: Secondary | ICD-10-CM | POA: Diagnosis not present
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
## 2023-03-02 DIAGNOSIS — Z419 Encounter for procedure for purposes other than remedying health state, unspecified: Secondary | ICD-10-CM | POA: Diagnosis not present

## 2023-03-04 DIAGNOSIS — R519 Headache, unspecified: Secondary | ICD-10-CM | POA: Diagnosis not present

## 2023-03-04 DIAGNOSIS — M5416 Radiculopathy, lumbar region: Secondary | ICD-10-CM | POA: Diagnosis not present

## 2023-03-04 DIAGNOSIS — M9905 Segmental and somatic dysfunction of pelvic region: Secondary | ICD-10-CM | POA: Diagnosis not present

## 2023-03-04 DIAGNOSIS — M62830 Muscle spasm of back: Secondary | ICD-10-CM | POA: Diagnosis not present

## 2023-03-04 DIAGNOSIS — M9902 Segmental and somatic dysfunction of thoracic region: Secondary | ICD-10-CM | POA: Diagnosis not present

## 2023-03-04 DIAGNOSIS — M542 Cervicalgia: Secondary | ICD-10-CM | POA: Diagnosis not present

## 2023-03-04 DIAGNOSIS — M9903 Segmental and somatic dysfunction of lumbar region: Secondary | ICD-10-CM | POA: Diagnosis not present

## 2023-03-04 DIAGNOSIS — M9901 Segmental and somatic dysfunction of cervical region: Secondary | ICD-10-CM | POA: Diagnosis not present

## 2023-03-04 DIAGNOSIS — M5417 Radiculopathy, lumbosacral region: Secondary | ICD-10-CM | POA: Diagnosis not present

## 2023-03-06 ENCOUNTER — Encounter
Admission: RE | Admit: 2023-03-06 | Discharge: 2023-03-06 | Disposition: A | Discharge status: Home / Self Care | Payer: Medicaid Other | Source: Ambulatory Visit | Attending: Anesthesiology | Admitting: Anesthesiology
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
## 2023-03-10 ENCOUNTER — Ambulatory Visit (INDEPENDENT_AMBULATORY_CARE_PROVIDER_SITE_OTHER): Payer: Medicaid Other | Admitting: Advanced Practice Midwife

## 2023-03-10 VITALS — BP 113/70 | HR 93 | Wt 234.4 lb

## 2023-03-10 DIAGNOSIS — Z3403 Encounter for supervision of normal first pregnancy, third trimester: Secondary | ICD-10-CM

## 2023-03-10 DIAGNOSIS — Z3A31 31 weeks gestation of pregnancy: Secondary | ICD-10-CM

## 2023-03-10 LAB — POCT URINALYSIS DIPSTICK
Bilirubin, UA: NEGATIVE
Blood, UA: NEGATIVE
Glucose, UA: NEGATIVE
Ketones, UA: NEGATIVE
Leukocytes, UA: NEGATIVE
Nitrite, UA: NEGATIVE
Protein, UA: NEGATIVE
Spec Grav, UA: 1.02 (ref 1.010–1.025)
Urobilinogen, UA: 0.2 U/dL
pH, UA: 6.5 (ref 5.0–8.0)

## 2023-03-10 NOTE — Progress Notes (Signed)
Routine Prenatal Care Visit  Subjective  Angela [name] is a 22 y.o. G1P0000 at [redacted]w[redacted]d being seen today for ongoing prenatal care.  She is currently monitored for the following issues for this low-risk pregnancy and has Lumbar spinal cord injury (HCC); Paralysis (HCC); Paraplegia (HCC); Depression, recurrent (HCC); Family history of diabetes mellitus (DM); Elevated LDL cholesterol level; Avitaminosis D; Exposure to industrial fumes; Supervision of normal pregnancy with risk factors; Obesity in pregnancy; Marijuana use during pregnancy; and Chronic instability of ankle on their problem list.  ----------------------------------------------------------------------------------- Patient reports she had anesthesia consult on 9/5 and was advised due to location of spinal cord injury that epidural anesthesia is not recommended and that [facility] has an alternate medication. She may want to consider transferring to [facility]. Will have patient see MD at next visit to discuss possible transfer. Complication of transfer is that patient lives an hour from [place] and has transportation issues.   Pt wanted to talk about pain management options for during labor including epidural analgesia. We discussed that there are some increased risks in the setting of her spinal cord injury depending on scar tissue formation around the spinal cord. Specifically there can be scarring of the epidural space that increases the risk of accidental dural puncture or inadequate expansion of the epidural space resulting in inadequate analgesia or one sided block. It is also possible that we are unable to successfully place an epidural catheter. We did also briefly discuss other options for labor analgesia, including IV pain medications and self administered nitrous oxide.  I discussed her case with other anesthesiologists in the group and noted that [facility] is using Remifentanil infusions for labor analgesia at this time.  Patient seemed very worried about  pain during her labor and multiple times mentioned the nerve pain she deals with after her injury.  Given her worry about pain management and that [facility] has more options than we do at this time I feel like [facility] is a better option for delivery for her.  She was agreeable and understood the concerns around managing her pain appropriately.        Electronically signed by Lenard Simmer, MD at 03/06/2023  5:42 PM  Contractions: Irritability. Vag. Bleeding: None.  Movement: Present. Leaking Fluid denies.  ----------------------------------------------------------------------------------- The following portions of the patient's history were reviewed and updated as appropriate: allergies, current medications, past family history, past medical history, past social history, past surgical history and problem list. Problem list updated.  Objective  Blood pressure 113/70, pulse 93, weight 234 lb 6.4 oz (106.3 kg), last menstrual period 07/24/2022 Pregravid weight 240 lb (108.9 kg) Total Weight Gain -5 lb 9.6 oz (-2.54 kg) Urinalysis: Urine Protein    Urine Glucose    Fetal Status: Fetal Heart Rate (bpm): 140   Movement: Present     General:  Alert, oriented and cooperative. Patient is in no acute distress.  Skin: Skin is warm and dry. No rash noted.   Cardiovascular: Normal heart rate noted  Respiratory: Normal respiratory effort, no problems with respiration noted  Abdomen: Soft, gravid, appropriate for gestational age. Pain/Pressure: Absent     Pelvic:  Cervical exam deferred        Extremities: Normal range of motion.  Edema: None  Mental Status: Normal mood and affect. Normal behavior. Normal judgment and thought content.   Assessment   22 y.o. G1P0000 at [redacted]w[redacted]d by  05/12/2023, by Ultrasound presenting for routine prenatal visit  Plan   first Problems (from 09/23/22 to present)  Problem Noted Resolved   Supervision of normal pregnancy 10/28/2022 by Allie Bossier, MD No   Overview Addendum  01/06/2023  3:39 PM by Mirna Mires, CNM     Nursing Staff Provider  Office Location  [facility] Dating  EDD 05/12/2023  Language  English Anatomy US  12/16/2022 female, incomplete- needs rescan  Flu Vaccine   Genetic Screen  NIPS: negative  TDaP vaccine    Hgb A1C or  GTT Early : Third trimester :   Covid    LAB RESULTS   Rhogam   Blood Type O/Positive/-- (04/15 0926)   Feeding Plan  Antibody Negative (04/15 0926)  Contraception  Rubella <0.90 (04/15 0926)  Circumcision  RPR Non Reactive (04/15 0926)   Pediatrician   HBsAg Negative (04/15 0926)   Support Person  HIV Non Reactive (04/15 5284)  Prenatal Classes  Varicella     GBS  (For PCN allergy, check sensitivities)   BTL Consent     VBAC Consent  Pap      Hgb Electro      CF      SMA               Supervision of other normal pregnancy, antepartum 09/23/2022 by Loran Senters, CMA 10/28/2022 by Allie Bossier, MD   Overview Addendum 10/28/2022  9:47 AM by Cornelius Moras, CMA     Clinical Staff Provider  Office Location  [facility] Dating  Not found.  Language  English Anatomy US    Flu Vaccine  UTD Genetic Screen  NIPS:   TDaP vaccine   offer Hgb A1C or  GTT Early : Third trimester :   Covid declines   LAB RESULTS   Rhogam  O/Positive/-- (04/15 1324)  Blood Type O/Positive/-- (04/15 0926)   Feeding Plan breast Antibody Negative (04/15 0926)  Contraception pill Rubella <0.90 (04/15 0926)  Circumcision yes RPR Non Reactive (04/15 0926)   Pediatrician  undecided HBsAg Negative (04/15 0926)   Support Person Eliberto Ivory  HIV Non Reactive (04/15 4010)  Prenatal Classes yes Varicella Non immune     GBS  (For PCN allergy, check sensitivities)   BTL Consent  Hep C Non Reactive (04/15 0926)   VBAC Consent  Pap Diagnosis  Date Value Ref Range Status  08/28/2020   Final   - Negative for intraepithelial lesion or malignancy (NILM)      Hgb Electro      CF      SMA                    Preterm labor symptoms and general  obstetric precautions including but not limited to vaginal bleeding, contractions, leaking of fluid and fetal movement were reviewed in detail with the patient. Please refer to After Visit Summary for other counseling recommendations.   MFM next f/u on 9/18  Return in about 2 weeks (around 03/24/2023) for rob with MD to discuss possible transfer to [facility].  Tresea Mall, CNM 03/10/2023 12:17 PM

## 2023-03-11 ENCOUNTER — Ambulatory Visit (HOSPITAL_COMMUNITY): Payer: Medicaid Other

## 2023-03-14 ENCOUNTER — Ambulatory Visit (HOSPITAL_COMMUNITY): Payer: Medicaid Other | Attending: Obstetrics

## 2023-03-14 ENCOUNTER — Encounter (HOSPITAL_COMMUNITY): Payer: Self-pay

## 2023-03-14 DIAGNOSIS — M5459 Other low back pain: Secondary | ICD-10-CM | POA: Insufficient documentation

## 2023-03-14 DIAGNOSIS — M5431 Sciatica, right side: Secondary | ICD-10-CM | POA: Diagnosis not present

## 2023-03-14 DIAGNOSIS — M546 Pain in thoracic spine: Secondary | ICD-10-CM | POA: Insufficient documentation

## 2023-03-14 DIAGNOSIS — R262 Difficulty in walking, not elsewhere classified: Secondary | ICD-10-CM | POA: Insufficient documentation
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
## 2023-03-17 ENCOUNTER — Telehealth: Payer: Self-pay

## 2023-03-17 ENCOUNTER — Other Ambulatory Visit: Payer: Self-pay

## 2023-03-17 DIAGNOSIS — O99210 Obesity complicating pregnancy, unspecified trimester: Secondary | ICD-10-CM

## 2023-03-17 DIAGNOSIS — G8220 Paraplegia, unspecified: Secondary | ICD-10-CM

## 2023-03-17 NOTE — Telephone Encounter (Signed)
LVM for patient to call back [phone], or to call PCP office to schedule follow up apt. AS, CMA  

## 2023-03-19 ENCOUNTER — Other Ambulatory Visit: Payer: Self-pay

## 2023-03-19 ENCOUNTER — Encounter (HOSPITAL_COMMUNITY): Payer: Medicaid Other

## 2023-03-19 ENCOUNTER — Ambulatory Visit: Payer: Medicaid Other | Attending: Obstetrics and Gynecology

## 2023-03-19 VITALS — BP 118/82 | HR 103 | Temp 98.1°F | Ht 66.0 in

## 2023-03-19 DIAGNOSIS — M5417 Radiculopathy, lumbosacral region: Secondary | ICD-10-CM | POA: Diagnosis not present

## 2023-03-19 DIAGNOSIS — G8220 Paraplegia, unspecified: Secondary | ICD-10-CM | POA: Diagnosis not present

## 2023-03-19 DIAGNOSIS — Z3A32 32 weeks gestation of pregnancy: Secondary | ICD-10-CM

## 2023-03-19 DIAGNOSIS — Z3403 Encounter for supervision of normal first pregnancy, third trimester: Secondary | ICD-10-CM

## 2023-03-19 DIAGNOSIS — M9903 Segmental and somatic dysfunction of lumbar region: Secondary | ICD-10-CM | POA: Diagnosis not present

## 2023-03-19 DIAGNOSIS — M9905 Segmental and somatic dysfunction of pelvic region: Secondary | ICD-10-CM | POA: Diagnosis not present

## 2023-03-19 DIAGNOSIS — O99891 Other specified diseases and conditions complicating pregnancy: Secondary | ICD-10-CM

## 2023-03-19 DIAGNOSIS — O99353 Diseases of the nervous system complicating pregnancy, third trimester: Secondary | ICD-10-CM | POA: Insufficient documentation

## 2023-03-19 DIAGNOSIS — Z3483 Encounter for supervision of other normal pregnancy, third trimester: Secondary | ICD-10-CM | POA: Diagnosis not present

## 2023-03-19 DIAGNOSIS — O358XX0 Maternal care for other (suspected) fetal abnormality and damage, not applicable or unspecified: Secondary | ICD-10-CM | POA: Insufficient documentation

## 2023-03-19 DIAGNOSIS — M9901 Segmental and somatic dysfunction of cervical region: Secondary | ICD-10-CM | POA: Diagnosis not present

## 2023-03-19 DIAGNOSIS — M62830 Muscle spasm of back: Secondary | ICD-10-CM | POA: Diagnosis not present

## 2023-03-19 DIAGNOSIS — O99213 Obesity complicating pregnancy, third trimester: Secondary | ICD-10-CM | POA: Diagnosis not present

## 2023-03-19 DIAGNOSIS — Z3482 Encounter for supervision of other normal pregnancy, second trimester: Secondary | ICD-10-CM | POA: Diagnosis not present

## 2023-03-19 DIAGNOSIS — M5416 Radiculopathy, lumbar region: Secondary | ICD-10-CM | POA: Diagnosis not present

## 2023-03-19 DIAGNOSIS — R519 Headache, unspecified: Secondary | ICD-10-CM | POA: Diagnosis not present

## 2023-03-19 DIAGNOSIS — E669 Obesity, unspecified: Secondary | ICD-10-CM

## 2023-03-19 DIAGNOSIS — M542 Cervicalgia: Secondary | ICD-10-CM | POA: Diagnosis not present

## 2023-03-19 DIAGNOSIS — O99210 Obesity complicating pregnancy, unspecified trimester: Secondary | ICD-10-CM

## 2023-03-19 DIAGNOSIS — Z362 Encounter for other antenatal screening follow-up: Secondary | ICD-10-CM | POA: Insufficient documentation

## 2023-03-19 DIAGNOSIS — M9902 Segmental and somatic dysfunction of thoracic region: Secondary | ICD-10-CM | POA: Diagnosis not present

## 2023-03-20 ENCOUNTER — Ambulatory Visit: Payer: Self-pay | Admitting: *Deleted

## 2023-03-20 NOTE — Telephone Encounter (Signed)
  Chief Complaint: right foot / ankle swelling , cut on foot with redness around cut. Hx 8 months pregnant now and has had toe amputated on same foot in the past.  Symptoms: right foot and ankle pain swelling. Cut to right foot redness around cut site.  Frequency: couple of days  Pertinent Negatives: Patient denies fever, no swelling to leg. No bleeding from cut reported  Disposition: [] ED /[x] Urgent Care (no appt availability in office) / [] Appointment(In office/virtual)/ []  [facility] [facility]/ [] Home Care/ [] Refused Recommended Disposition /[] [facility]/ []  Follow-up with PCP Additional Notes:   No available appt with PCP or other providers in office until next week. Recommended UC due to no available appt . Unsure patient will go to UC.  Please advise if appt becomes available with PCP       Reason for Disposition  [1] Swollen foot AND [2] no fever  (Exceptions: localized bump from bunions, calluses, insect bite, sting)  Answer Assessment - Initial Assessment Questions 1. ONSET: "When did the pain start?"      Couple of days ago  2. LOCATION: "Where is the pain located?"      Right foot  3. PAIN: "How bad is the pain?"    (Scale 1-10; or mild, moderate, severe)  - MILD (1-3): doesn't interfere with normal activities.   - MODERATE (4-7): interferes with normal activities (e.g., work or school) or awakens from sleep, limping.   - SEVERE (8-10): excruciating pain, unable to do any normal activities, unable to walk.      Level 4  4. WORK OR EXERCISE: "Has there been any recent work or exercise that involved this part of the body?"      Not sure  5. CAUSE: "What do you think is causing the foot pain?"     Right foot swelling  6. OTHER SYMPTOMS: "Do you have any other symptoms?" (e.g., leg pain, rash, fever, numbness)     Right foot swelling to ankle , cut noted on foot and redness around cut. Hx toe amputation. 7. PREGNANCY: "Is there any chance you are pregnant?"  "When was your last menstrual period?"     Pregnant, 8 months  Protocols used: Foot Pain-A-AH

## 2023-03-20 NOTE — Telephone Encounter (Signed)
Patient was advised.

## 2023-03-21 ENCOUNTER — Ambulatory Visit: Payer: Medicaid Other | Admitting: Pediatrics

## 2023-03-21 ENCOUNTER — Ambulatory Visit (HOSPITAL_COMMUNITY): Payer: Medicaid Other

## 2023-03-21 DIAGNOSIS — M5431 Sciatica, right side: Secondary | ICD-10-CM

## 2023-03-21 DIAGNOSIS — R262 Difficulty in walking, not elsewhere classified: Secondary | ICD-10-CM

## 2023-03-21 DIAGNOSIS — M546 Pain in thoracic spine: Secondary | ICD-10-CM

## 2023-03-21 DIAGNOSIS — M5459 Other low back pain: Secondary | ICD-10-CM
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
## 2023-03-22 DIAGNOSIS — E669 Obesity, unspecified: Secondary | ICD-10-CM | POA: Diagnosis not present

## 2023-03-22 DIAGNOSIS — Z6837 Body mass index (BMI) 37.0-37.9, adult: Secondary | ICD-10-CM | POA: Diagnosis not present

## 2023-03-22 DIAGNOSIS — L0390 Cellulitis, unspecified: Secondary | ICD-10-CM | POA: Diagnosis not present

## 2023-03-24 NOTE — Addendum Note (Signed)
Addended by: Seymour Bars on: 03/24/2023 04:05 PM   Modules accepted: Orders

## 2023-03-25 ENCOUNTER — Ambulatory Visit (HOSPITAL_COMMUNITY): Payer: Medicaid Other

## 2023-03-25 DIAGNOSIS — M5459 Other low back pain: Secondary | ICD-10-CM

## 2023-03-25 DIAGNOSIS — R262 Difficulty in walking, not elsewhere classified: Secondary | ICD-10-CM | POA: Diagnosis not present

## 2023-03-25 DIAGNOSIS — M5431 Sciatica, right side: Secondary | ICD-10-CM | POA: Diagnosis not present

## 2023-03-25 DIAGNOSIS — M546 Pain in thoracic spine: Secondary | ICD-10-CM
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
## 2023-03-25 NOTE — Addendum Note (Signed)
Addended by: Seymour Bars on: 03/25/2023 01:21 PM   Modules accepted: Orders

## 2023-03-26 ENCOUNTER — Encounter: Payer: Self-pay | Admitting: Obstetrics and Gynecology

## 2023-03-26 ENCOUNTER — Ambulatory Visit: Payer: Medicaid Other | Admitting: Obstetrics and Gynecology

## 2023-03-26 ENCOUNTER — Other Ambulatory Visit: Payer: Self-pay | Admitting: Obstetrics and Gynecology

## 2023-03-26 VITALS — BP 106/72 | HR 112 | Wt 231.6 lb

## 2023-03-26 DIAGNOSIS — Z2911 Encounter for prophylactic immunotherapy for respiratory syncytial virus (RSV): Secondary | ICD-10-CM

## 2023-03-26 DIAGNOSIS — Z23 Encounter for immunization: Secondary | ICD-10-CM | POA: Diagnosis not present

## 2023-03-26 DIAGNOSIS — Z3A33 33 weeks gestation of pregnancy: Secondary | ICD-10-CM

## 2023-03-26 DIAGNOSIS — S34109S Unspecified injury to unspecified level of lumbar spinal cord, sequela: Secondary | ICD-10-CM

## 2023-03-26 DIAGNOSIS — O99213 Obesity complicating pregnancy, third trimester: Secondary | ICD-10-CM

## 2023-03-26 DIAGNOSIS — Z3403 Encounter for supervision of normal first pregnancy, third trimester: Secondary | ICD-10-CM

## 2023-03-26 LAB — POCT URINALYSIS DIPSTICK OB
Bilirubin, UA: NEGATIVE
Blood, UA: NEGATIVE
Glucose, UA: NEGATIVE
Ketones, UA: NEGATIVE
Leukocytes, UA: NEGATIVE
Nitrite, UA: NEGATIVE
POC,PROTEIN,UA: NEGATIVE
Spec Grav, UA: 1.01 (ref 1.010–1.025)
Urobilinogen, UA: 0.2 E.U./dL
pH, UA: 7.5 (ref 5.0–8.0)

## 2023-03-26 NOTE — Progress Notes (Signed)
ROB. Patient states daily fetal movement. Has been feeling unwell recently, safe meds list given for cold symptoms. Would like to discuss possible need for transfer to [facility]. RSV and Flu vaccinations administered. Patient states no questions or concerns at this time.

## 2023-03-26 NOTE — Progress Notes (Signed)
ROB: She has no complaints at this time.  Reports daily fetal movement.  She primarily is here to discuss anesthesia's last recommendation that she consider transfer to [facility] because of possible future issues with her epidural.  We have discussed this in detail she has been made aware that they have some concerns that an epidural may not function correctly during her labor and she may not receive adequate pain relief.  She understands that it is the anesthesia department's opinion that there may be additional medications or technology that is available at [facility] should she transfer her obstetrical care there and she is to deliver.  She would like to stay in this area as [facility] is "1 hour away" from her house and it is much more convenient for her to stay.  She is aware that her epidural may not function well in labor and give her adequate pain relief.  At this time I believe she has decided upon continuing her medical care at a OB and delivering at [facility]. BPP next week NST the following week and weekly thereafter.

## 2023-04-01 DIAGNOSIS — Z419 Encounter for procedure for purposes other than remedying health state, unspecified: Secondary | ICD-10-CM | POA: Diagnosis not present

## 2023-04-02 ENCOUNTER — Ambulatory Visit: Payer: Medicaid Other | Attending: Maternal & Fetal Medicine

## 2023-04-02 ENCOUNTER — Other Ambulatory Visit: Payer: Self-pay

## 2023-04-02 VITALS — BP 124/78 | HR 88 | Temp 98.1°F | Resp 16 | Ht 66.5 in | Wt 231.0 lb

## 2023-04-02 DIAGNOSIS — E669 Obesity, unspecified: Secondary | ICD-10-CM

## 2023-04-02 DIAGNOSIS — Z3A34 34 weeks gestation of pregnancy: Secondary | ICD-10-CM | POA: Diagnosis not present

## 2023-04-02 DIAGNOSIS — O99891 Other specified diseases and conditions complicating pregnancy: Secondary | ICD-10-CM | POA: Diagnosis not present

## 2023-04-02 DIAGNOSIS — O358XX0 Maternal care for other (suspected) fetal abnormality and damage, not applicable or unspecified: Secondary | ICD-10-CM | POA: Insufficient documentation

## 2023-04-02 DIAGNOSIS — O2693 Pregnancy related conditions, unspecified, third trimester: Secondary | ICD-10-CM | POA: Diagnosis not present

## 2023-04-02 DIAGNOSIS — O3503X0 Maternal care for (suspected) central nervous system malformation or damage in fetus, choroid plexus cysts, not applicable or unspecified: Secondary | ICD-10-CM | POA: Diagnosis not present

## 2023-04-02 DIAGNOSIS — O99213 Obesity complicating pregnancy, third trimester: Secondary | ICD-10-CM | POA: Diagnosis not present

## 2023-04-02 DIAGNOSIS — Z3403 Encounter for supervision of normal first pregnancy, third trimester: Secondary | ICD-10-CM

## 2023-04-02 DIAGNOSIS — G8220 Paraplegia, unspecified: Secondary | ICD-10-CM

## 2023-04-02 DIAGNOSIS — Z362 Encounter for other antenatal screening follow-up: Secondary | ICD-10-CM | POA: Diagnosis not present

## 2023-04-03 ENCOUNTER — Encounter (HOSPITAL_COMMUNITY): Payer: Medicaid Other

## 2023-04-08 ENCOUNTER — Ambulatory Visit (HOSPITAL_COMMUNITY): Payer: Medicaid Other | Attending: Obstetrics

## 2023-04-08 DIAGNOSIS — M5459 Other low back pain: Secondary | ICD-10-CM | POA: Insufficient documentation

## 2023-04-08 DIAGNOSIS — M546 Pain in thoracic spine: Secondary | ICD-10-CM | POA: Insufficient documentation

## 2023-04-08 DIAGNOSIS — M5431 Sciatica, right side: Secondary | ICD-10-CM | POA: Diagnosis not present

## 2023-04-08 DIAGNOSIS — R262 Difficulty in walking, not elsewhere classified: Secondary | ICD-10-CM | POA: Diagnosis not present
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
## 2023-04-09 ENCOUNTER — Other Ambulatory Visit: Payer: Medicaid Other

## 2023-04-09 ENCOUNTER — Other Ambulatory Visit: Payer: Self-pay

## 2023-04-09 ENCOUNTER — Ambulatory Visit: Payer: Medicaid Other | Attending: Obstetrics

## 2023-04-09 ENCOUNTER — Ambulatory Visit (INDEPENDENT_AMBULATORY_CARE_PROVIDER_SITE_OTHER): Payer: Medicaid Other | Admitting: Obstetrics and Gynecology

## 2023-04-09 ENCOUNTER — Encounter: Payer: Self-pay | Admitting: Obstetrics and Gynecology

## 2023-04-09 VITALS — BP 117/82 | HR 102 | Wt 235.6 lb

## 2023-04-09 VITALS — BP 125/82 | HR 109 | Temp 97.0°F | Resp 18

## 2023-04-09 DIAGNOSIS — Z362 Encounter for other antenatal screening follow-up: Secondary | ICD-10-CM | POA: Diagnosis not present

## 2023-04-09 DIAGNOSIS — M5417 Radiculopathy, lumbosacral region: Secondary | ICD-10-CM | POA: Diagnosis not present

## 2023-04-09 DIAGNOSIS — S34109S Unspecified injury to unspecified level of lumbar spinal cord, sequela: Secondary | ICD-10-CM

## 2023-04-09 DIAGNOSIS — M9902 Segmental and somatic dysfunction of thoracic region: Secondary | ICD-10-CM | POA: Diagnosis not present

## 2023-04-09 DIAGNOSIS — M9901 Segmental and somatic dysfunction of cervical region: Secondary | ICD-10-CM | POA: Diagnosis not present

## 2023-04-09 DIAGNOSIS — M9903 Segmental and somatic dysfunction of lumbar region: Secondary | ICD-10-CM | POA: Diagnosis not present

## 2023-04-09 DIAGNOSIS — O99891 Other specified diseases and conditions complicating pregnancy: Secondary | ICD-10-CM | POA: Diagnosis not present

## 2023-04-09 DIAGNOSIS — Z3403 Encounter for supervision of normal first pregnancy, third trimester: Secondary | ICD-10-CM

## 2023-04-09 DIAGNOSIS — Z3A35 35 weeks gestation of pregnancy: Secondary | ICD-10-CM

## 2023-04-09 DIAGNOSIS — O99213 Obesity complicating pregnancy, third trimester: Secondary | ICD-10-CM | POA: Insufficient documentation

## 2023-04-09 DIAGNOSIS — G8220 Paraplegia, unspecified: Secondary | ICD-10-CM | POA: Insufficient documentation

## 2023-04-09 DIAGNOSIS — M5416 Radiculopathy, lumbar region: Secondary | ICD-10-CM | POA: Diagnosis not present

## 2023-04-09 DIAGNOSIS — E669 Obesity, unspecified: Secondary | ICD-10-CM | POA: Diagnosis not present

## 2023-04-09 DIAGNOSIS — M62830 Muscle spasm of back: Secondary | ICD-10-CM | POA: Diagnosis not present

## 2023-04-09 DIAGNOSIS — O99353 Diseases of the nervous system complicating pregnancy, third trimester: Secondary | ICD-10-CM | POA: Diagnosis not present

## 2023-04-09 DIAGNOSIS — M542 Cervicalgia: Secondary | ICD-10-CM | POA: Diagnosis not present

## 2023-04-09 DIAGNOSIS — R519 Headache, unspecified: Secondary | ICD-10-CM | POA: Diagnosis not present

## 2023-04-09 DIAGNOSIS — M9905 Segmental and somatic dysfunction of pelvic region: Secondary | ICD-10-CM | POA: Diagnosis not present

## 2023-04-09 DIAGNOSIS — O3503X0 Maternal care for (suspected) central nervous system malformation or damage in fetus, choroid plexus cysts, not applicable or unspecified: Secondary | ICD-10-CM | POA: Diagnosis not present

## 2023-04-09 LAB — POCT URINALYSIS DIPSTICK OB
Bilirubin, UA: NEGATIVE
Blood, UA: NEGATIVE
Glucose, UA: NEGATIVE
Ketones, UA: NEGATIVE
Leukocytes, UA: NEGATIVE
Nitrite, UA: NEGATIVE
POC,PROTEIN,UA: NEGATIVE
Spec Grav, UA: 1.02 (ref 1.010–1.025)
Urobilinogen, UA: 0.2 U/dL
pH, UA: 6 (ref 5.0–8.0)

## 2023-04-09 NOTE — Progress Notes (Signed)
ROB: She reports she is doing well.  Reports fetal movement.  She has reaffirmed her desire to stay at [facility] for birth.  She knows that epidurals may not be part of her pain relief strategy in labor dependent upon anesthesia.  BPP later today with weekly NSTs after that.  Cultures next visit.

## 2023-04-09 NOTE — Progress Notes (Addendum)
ROB. Patient states daily fetal movement. Reports increased frequent urination, UA negative. BPP scheduled today at 11 am. She states no questions or concerns at this time.

## 2023-04-15 ENCOUNTER — Ambulatory Visit (HOSPITAL_COMMUNITY): Payer: Medicaid Other

## 2023-04-15 DIAGNOSIS — M5431 Sciatica, right side: Secondary | ICD-10-CM

## 2023-04-15 DIAGNOSIS — M5459 Other low back pain: Secondary | ICD-10-CM | POA: Diagnosis not present

## 2023-04-15 DIAGNOSIS — R262 Difficulty in walking, not elsewhere classified: Secondary | ICD-10-CM | POA: Diagnosis not present

## 2023-04-15 DIAGNOSIS — M546 Pain in thoracic spine: Secondary | ICD-10-CM | POA: Diagnosis not present
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
## 2023-04-16 ENCOUNTER — Encounter: Payer: Self-pay | Admitting: Certified Nurse Midwife

## 2023-04-16 ENCOUNTER — Ambulatory Visit (INDEPENDENT_AMBULATORY_CARE_PROVIDER_SITE_OTHER): Payer: Medicaid Other | Admitting: Certified Nurse Midwife

## 2023-04-16 ENCOUNTER — Other Ambulatory Visit (HOSPITAL_COMMUNITY)
Admission: RE | Admit: 2023-04-16 | Discharge: 2023-04-16 | Disposition: A | Discharge status: Home / Self Care | Payer: Medicaid Other | Source: Ambulatory Visit | Attending: Certified Nurse Midwife | Admitting: Certified Nurse Midwife

## 2023-04-16 ENCOUNTER — Other Ambulatory Visit: Payer: Self-pay

## 2023-04-16 ENCOUNTER — Ambulatory Visit: Payer: Medicaid Other | Attending: Obstetrics and Gynecology

## 2023-04-16 VITALS — BP 126/78 | HR 102 | Temp 98.2°F | Ht 62.0 in | Wt 236.0 lb

## 2023-04-16 VITALS — BP 130/86 | HR 98 | Wt 236.8 lb

## 2023-04-16 DIAGNOSIS — Z362 Encounter for other antenatal screening follow-up: Secondary | ICD-10-CM | POA: Diagnosis not present

## 2023-04-16 DIAGNOSIS — E669 Obesity, unspecified: Secondary | ICD-10-CM

## 2023-04-16 DIAGNOSIS — W3400XS Accidental discharge from unspecified firearms or gun, sequela: Secondary | ICD-10-CM | POA: Diagnosis not present

## 2023-04-16 DIAGNOSIS — O358XX0 Maternal care for other (suspected) fetal abnormality and damage, not applicable or unspecified: Secondary | ICD-10-CM | POA: Insufficient documentation

## 2023-04-16 DIAGNOSIS — G8220 Paraplegia, unspecified: Secondary | ICD-10-CM | POA: Diagnosis not present

## 2023-04-16 DIAGNOSIS — O99891 Other specified diseases and conditions complicating pregnancy: Secondary | ICD-10-CM

## 2023-04-16 DIAGNOSIS — Z3685 Encounter for antenatal screening for Streptococcus B: Secondary | ICD-10-CM | POA: Diagnosis not present

## 2023-04-16 DIAGNOSIS — Z113 Encounter for screening for infections with a predominantly sexual mode of transmission: Secondary | ICD-10-CM | POA: Insufficient documentation

## 2023-04-16 DIAGNOSIS — S34109S Unspecified injury to unspecified level of lumbar spinal cord, sequela: Secondary | ICD-10-CM | POA: Insufficient documentation

## 2023-04-16 DIAGNOSIS — Z3A36 36 weeks gestation of pregnancy: Secondary | ICD-10-CM | POA: Diagnosis not present

## 2023-04-16 DIAGNOSIS — O3503X0 Maternal care for (suspected) central nervous system malformation or damage in fetus, choroid plexus cysts, not applicable or unspecified: Secondary | ICD-10-CM

## 2023-04-16 DIAGNOSIS — O9A213 Injury, poisoning and certain other consequences of external causes complicating pregnancy, third trimester: Secondary | ICD-10-CM | POA: Insufficient documentation

## 2023-04-16 DIAGNOSIS — O99213 Obesity complicating pregnancy, third trimester: Secondary | ICD-10-CM | POA: Insufficient documentation

## 2023-04-16 DIAGNOSIS — O2690 Pregnancy related conditions, unspecified, unspecified trimester: Secondary | ICD-10-CM | POA: Insufficient documentation

## 2023-04-16 DIAGNOSIS — Z3403 Encounter for supervision of normal first pregnancy, third trimester: Secondary | ICD-10-CM

## 2023-04-16 DIAGNOSIS — Z3493 Encounter for supervision of normal pregnancy, unspecified, third trimester: Secondary | ICD-10-CM

## 2023-04-16 NOTE — Progress Notes (Signed)
ROB doing well, feeling movement. She is having some lower back discomfort. Self help measures reviewed. Discussed labor precautions. GBS & cultures collected today. Pt has MFM BPP scheduled today. Follow up 1 wk for ROB   Doreene Burke, CNM

## 2023-04-16 NOTE — Patient Instructions (Signed)
Group B Streptococcus Infection During Pregnancy Group B Streptococcus (GBS) is a type of bacteria that is often found in healthy people. It is commonly found in the rectum, vagina, and intestines. In people who are healthy and not pregnant, the bacteria rarely cause serious illness or complications. However, women who test positive for GBS during pregnancy can pass the bacteria to the baby during childbirth. This can cause serious infection in the baby after birth. Women with GBS may also have infections during their pregnancy or soon after childbirth. The infections include urinary tract infections (UTIs) or infections of the uterus. GBS also increases a woman's risk of complications during pregnancy, such as early labor or delivery, miscarriage, or stillbirth. Routine testing for GBS is recommended for all pregnant women. What are the causes? This condition is caused by bacteria called Streptococcus agalactiae. What increases the risk? You may have a higher risk for GBS infection during pregnancy if you had one during a past pregnancy. What are the signs or symptoms? In most cases, GBS infection does not cause symptoms in pregnant women. If symptoms exist, they may include: Labor that starts before the 37th week of pregnancy. A UTI or bladder infection. This may cause a fever, frequent urination, or pain and burning during urination. Fever during labor. There can also be a rapid heartbeat in the mother or baby. Rare but serious symptoms of a GBS infection in women include: Blood infection (septicemia). This may cause fever, chills, or confusion. Lung infection (pneumonia). This may cause fever, chills, cough, rapid breathing, chest pain, or difficulty breathing. Bone, joint, skin, or soft tissue infection. How is this diagnosed? You may be screened for GBS between week 35 and week 37 of pregnancy. If you have symptoms of preterm labor, you may be screened earlier. This condition is diagnosed  based on lab test results from: A swab of fluid from the vagina and rectum. A urine sample. How is this treated? This condition is treated with antibiotic medicine. Antibiotic medicine may be given: To you when you go into labor, or as soon as your water breaks. The medicines will continue until after you give birth. If you are having a cesarean delivery, you do not need antibiotics unless your water has broken. To your baby, if he or she requires treatment. Your health care provider will check your baby to decide if he or she needs antibiotics to prevent a serious infection. Follow these instructions at home: Take over-the-counter and prescription medicines only as told by your health care provider. Take your antibiotic medicine as told by your health care provider. Do not stop taking the antibiotic even if you start to feel better. Keep all pre-birth (prenatal) visits and follow-up visits as told by your health care provider. This is important. Contact a health care provider if: You have pain or burning when you urinate. You have to urinate more often than usual. You have a fever or chills. You develop a bad-smelling vaginal discharge. Get help right away if: Your water breaks. You go into labor. You have severe pain in your abdomen. You have difficulty breathing. You have chest pain. These symptoms may represent a serious problem that is an emergency. Do not wait to see if the symptoms will go away. Get medical help right away. Call your local emergency services (911 in the [place] Do not drive yourself to the hospital. Summary GBS is a type of bacteria that is common in healthy people. During pregnancy, colonization with GBS can cause   serious complications for you or your baby. Your health care provider will screen you between 35 and 37 weeks of pregnancy to determine if you are colonized with GBS. If you are colonized with GBS during pregnancy, your health care provider will recommend  antibiotics through an IV during labor. After delivery, your baby will be evaluated for complications related to potential GBS infection and may require antibiotics to prevent a serious infection. This information is not intended to replace advice given to you by your health care provider. Make sure you discuss any questions you have with your health care provider. Document Revised: 06/03/2022 Document Reviewed: 06/03/2022 Elsevier Patient Education  2024 [employer]  

## 2023-04-17 LAB — CERVICOVAGINAL ANCILLARY ONLY
Chlamydia: NEGATIVE
Comment: NEGATIVE
Comment: NORMAL
Neisseria Gonorrhea: NEGATIVE

## 2023-04-18 LAB — STREP GP B NAA: Strep Gp B NAA: NEGATIVE

## 2023-04-22 ENCOUNTER — Ambulatory Visit (HOSPITAL_COMMUNITY): Payer: Medicaid Other

## 2023-04-22 ENCOUNTER — Telehealth: Payer: Self-pay

## 2023-04-22 DIAGNOSIS — M5459 Other low back pain: Secondary | ICD-10-CM

## 2023-04-22 DIAGNOSIS — M5431 Sciatica, right side: Secondary | ICD-10-CM

## 2023-04-22 DIAGNOSIS — M546 Pain in thoracic spine: Secondary | ICD-10-CM | POA: Diagnosis not present

## 2023-04-22 DIAGNOSIS — R262 Difficulty in walking, not elsewhere classified: Secondary | ICD-10-CM | POA: Diagnosis not present

## 2023-04-22 NOTE — Telephone Encounter (Signed)
Pt calling; has hemorrhoids; has appt c [facility] tomorrow but wants rx for tx before then.  [phone]  Pt adv per new protocol of otc meds to try; adv to have [facility] look at it tomorrow.
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
## 2023-04-23 ENCOUNTER — Ambulatory Visit: Payer: Medicaid Other

## 2023-04-23 ENCOUNTER — Other Ambulatory Visit: Payer: Medicaid Other

## 2023-04-23 ENCOUNTER — Ambulatory Visit (INDEPENDENT_AMBULATORY_CARE_PROVIDER_SITE_OTHER): Payer: Medicaid Other | Admitting: Obstetrics

## 2023-04-23 ENCOUNTER — Encounter: Payer: Self-pay | Admitting: Obstetrics

## 2023-04-23 VITALS — BP 129/82 | HR 112 | Ht 62.0 in | Wt 235.0 lb

## 2023-04-23 VITALS — BP 129/82 | HR 112 | Wt 235.0 lb

## 2023-04-23 DIAGNOSIS — O99213 Obesity complicating pregnancy, third trimester: Secondary | ICD-10-CM | POA: Diagnosis not present

## 2023-04-23 DIAGNOSIS — E669 Obesity, unspecified: Secondary | ICD-10-CM

## 2023-04-23 DIAGNOSIS — Z3A37 37 weeks gestation of pregnancy: Secondary | ICD-10-CM | POA: Insufficient documentation

## 2023-04-23 DIAGNOSIS — Z3403 Encounter for supervision of normal first pregnancy, third trimester: Secondary | ICD-10-CM

## 2023-04-23 NOTE — Progress Notes (Signed)
    NURSE VISIT NOTE  Subjective:    Patient ID: Huey Bienenstock, female    DOB: 16-Feb-2001, 22 y.o.   MRN: [id number]  HPI  Patient is a 22 y.o. G52P0000 female who presents for fetal monitoring per order from Doreene Burke, [place].   Objective:    BP 129/82   Pulse (!) 112   Ht 5\' 2"  (1.575 m)   Wt 235 lb (106.6 kg)   LMP 07/24/2022 (Exact Date)   BMI 42.98 kg/m  Estimated Date of Delivery: 05/12/23  [redacted]w[redacted]d  Fetus A Non-Stress Test Interpretation for 04/23/23  Indication: Obesity  Fetal Heart Rate A Mode: External Baseline Rate (A): 140 bpm Variability: Moderate Accelerations: 15 x 15 Decelerations: None Multiple birth?: No  Uterine Activity Mode: Toco Contraction Frequency (min): None  Interpretation (Fetal Testing) Nonstress Test Interpretation: Reactive Overall Impression: Reassuring for gestational age   Assessment:   1. Obesity affecting pregnancy in third trimester, unspecified obesity type   2. [redacted] weeks gestation of pregnancy      Plan:   Results reviewed and discussed with patient by  [name], CNM.     Rocco Serene, LPN

## 2023-04-23 NOTE — Assessment & Plan Note (Signed)
-  RNST today -Reviewed s/s of labor and when to go the hospital -SVE today: 1/50/-1 -Discussed pain management in labor

## 2023-04-23 NOTE — Progress Notes (Addendum)
    Return Prenatal Note   Assessment/Plan   Plan  22 y.o. G1P0000 at [redacted]w[redacted]d presents for follow-up OB visit. Reviewed prenatal record including previous visit note.  Supervision of normal pregnancy -RNST today -Reviewed s/s of labor and when to go the hospital -SVE today: 1/50/-1 -Discussed pain management in labor   No orders of the defined types were placed in this encounter.  Return in about 1 week (around 04/30/2023).   Future Appointments  Date Time Provider Department Center  04/30/2023 10:45 AM AOB-NST ROOM [facility] None  04/30/2023 11:15 AM AOB-NST ROOM [facility]OB-A[facility] None  05/07/2023 10:45 AM AOB-NST ROOM AOB-A[facility] None    For next visit:  continue with routine prenatal care     Subjective   Janiyha is feeling increased pelvic pressure and BH contractions. She feels like the baby has dropped. She prefers spontaneous labor and would like to avoid induction if possible.  Movement: Present Contractions: Not present  Objective   Flow sheet Vitals: Pulse Rate: (!) 112 BP: 129/82 Fundal Height: 37 cm Fetal Heart Rate (bpm): see NST Presentation: Vertex Dilation: 1 Effacement (%): 50 Station: -1 Total weight gain: -5 lb (-2.268 kg)  General Appearance  No acute distress, well appearing, and well nourished Pulmonary   Normal work of breathing Neurologic   Alert and oriented to person, place, and time Psychiatric   Mood and affect within normal limits  [name], CNM 04/23/23 12:08 PM

## 2023-04-23 NOTE — Patient Instructions (Signed)
Nonstress Test A nonstress test is a procedure that is done during pregnancy in order to check the baby's heartbeat. The procedure can help to show if the baby (fetus) is healthy. It is commonly done when: The baby is past his or her due date. The pregnancy is high risk. The baby is moving less than normal. The mother has lost a pregnancy in the past. The health care provider suspects a problem with the baby's growth. There is too much or too little amniotic fluid. The procedure is often done in the third trimester of pregnancy to find out if an early delivery is needed and whether such a delivery is safe. During a nonstress test, the baby's heartbeat is monitored when the baby is resting and when the baby is moving. If the baby is healthy, the heart rate will increase when he or she moves or kicks and will return to normal when he or she rests. Tell a health care provider about: Any allergies you have. Any medical conditions you have. All medicines you are taking, including vitamins, herbs, eye drops, creams, and over-the-counter medicines. Any surgeries you have had. Any past pregnancies you have had. What are the risks? There are no risks to you or your baby from a nonstress test. This procedure should not be painful or uncomfortable. What happens before the procedure? Eat a meal right before the test or as directed by your health care provider. Food may help encourage the baby to move. Use the restroom right before the test. What happens during the procedure?  Two monitors will be placed on your abdomen. One will record the baby's heart rate and the other will record the contractions of your uterus. You may be asked to lie down on your side or to sit upright. You may be given a button to press when you feel your baby move. Your health care provider will listen to your baby's heartbeat and record it. He or she may also watch your baby's heartbeat on a screen. If the baby seems to be  sleeping, you may be asked to drink some juice or soda, eat a snack, or change positions. The procedure may vary among health care providers and hospitals. What can I expect after procedure? Your health care provider will discuss the test results with you and make recommendations for the future. Depending on the results, your health care provider may order additional tests or another course of action. If your health care provider gave you any diet or activity instructions, make sure to follow them. Keep all follow-up visits. This is important. Summary A nonstress test is a procedure that is done during pregnancy in order to check the baby's heartbeat. The procedure can help show if the baby is healthy. The procedure is often done in the third trimester of pregnancy to find out if an early delivery is needed and whether such a delivery is safe. During a nonstress test, the baby's heartbeat is monitored when the baby is resting and when the baby is moving. If the baby is healthy, the heart rate will increase when he or she moves or kicks and will return to normal when he or she rests. Your health care provider will discuss the test results with you and make recommendations for the future. This information is not intended to replace advice given to you by your health care provider. Make sure you discuss any questions you have with your health care provider. Document Revised: 02/28/2021 Document Reviewed: 03/27/2020 Elsevier Patient   Education  2024 [employer]  

## 2023-04-24 ENCOUNTER — Encounter: Payer: Self-pay | Admitting: Obstetrics

## 2023-04-25 ENCOUNTER — Telehealth: Payer: Self-pay | Admitting: Obstetrics

## 2023-04-25 NOTE — Telephone Encounter (Signed)
I contacted the patient vis phone. The reason for the call is due to a scheduled adjustment for nonstress at 10:15 am and 1:35 routine visit with the provider. Please review new scheduling with the patient when she returns call.

## 2023-04-28 NOTE — Telephone Encounter (Signed)
Spoke with the patient via phone. She is aware of scheduling adjustments

## 2023-04-29 ENCOUNTER — Encounter (HOSPITAL_COMMUNITY): Payer: Medicaid Other

## 2023-04-30 ENCOUNTER — Other Ambulatory Visit: Payer: Medicaid Other

## 2023-04-30 ENCOUNTER — Ambulatory Visit: Payer: Medicaid Other

## 2023-04-30 ENCOUNTER — Ambulatory Visit: Payer: Medicaid Other | Admitting: Obstetrics

## 2023-04-30 ENCOUNTER — Encounter: Payer: Medicaid Other | Admitting: Certified Nurse Midwife

## 2023-04-30 ENCOUNTER — Other Ambulatory Visit: Payer: Self-pay | Admitting: Obstetrics

## 2023-04-30 ENCOUNTER — Encounter: Payer: Medicaid Other | Admitting: Obstetrics

## 2023-04-30 VITALS — BP 122/80 | HR 104 | Wt 237.5 lb

## 2023-04-30 VITALS — BP 122/80 | HR 104 | Ht 62.0 in | Wt 237.5 lb

## 2023-04-30 DIAGNOSIS — E669 Obesity, unspecified: Secondary | ICD-10-CM | POA: Diagnosis not present

## 2023-04-30 DIAGNOSIS — Z3A38 38 weeks gestation of pregnancy: Secondary | ICD-10-CM

## 2023-04-30 DIAGNOSIS — O99213 Obesity complicating pregnancy, third trimester: Secondary | ICD-10-CM
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
## 2023-04-30 NOTE — Progress Notes (Signed)
    NURSE VISIT NOTE  Subjective:    Patient ID: Angela [name], female    DOB: 03-26-2001, 22 y.o.   MRN: [id number]  HPI  Patient is a 22 y.o. G29P0000 female who presents for fetal monitoring per order from Julieanne Manson, MD.   Objective:    BP 122/80   Pulse (!) 104   Ht 5\' 2"  (1.575 m)   Wt 237 lb 8 oz (107.7 kg)   LMP 07/24/2022 (Exact Date)   BMI 43.44 kg/m  Estimated Date of Delivery: 05/12/23  [redacted]w[redacted]d  Fetus A Non-Stress Test Interpretation for 04/30/23  Indication: Obesity  Fetal Heart Rate A Mode: External Baseline Rate (A): 130 bpm Variability: Moderate Accelerations: 15 x 15 Decelerations: None Multiple birth?: No  Uterine Activity Mode: Toco Contraction Frequency (min): None  Interpretation (Fetal Testing) Nonstress Test Interpretation: Reactive Overall Impression: Reassuring for gestational age   Assessment:   1. Obesity affecting pregnancy in third trimester, unspecified obesity type   2. [redacted] weeks gestation of pregnancy      Plan:   Results reviewed and discussed with patient by  Julieanne Manson, MD.     Rocco Serene, LPN

## 2023-05-01 ENCOUNTER — Encounter: Payer: Self-pay | Admitting: Obstetrics

## 2023-05-02 DIAGNOSIS — Z419 Encounter for procedure for purposes other than remedying health state, unspecified: Secondary | ICD-10-CM | POA: Diagnosis not present

## 2023-05-07 ENCOUNTER — Other Ambulatory Visit: Payer: Medicaid Other

## 2023-05-07 ENCOUNTER — Ambulatory Visit (INDEPENDENT_AMBULATORY_CARE_PROVIDER_SITE_OTHER): Payer: Medicaid Other | Admitting: Licensed Practical Nurse

## 2023-05-07 ENCOUNTER — Encounter: Payer: Medicaid Other | Admitting: Obstetrics and Gynecology

## 2023-05-07 ENCOUNTER — Ambulatory Visit (INDEPENDENT_AMBULATORY_CARE_PROVIDER_SITE_OTHER): Payer: Medicaid Other

## 2023-05-07 VITALS — BP 135/82 | HR 101 | Ht 62.0 in | Wt 233.6 lb

## 2023-05-07 VITALS — BP 135/82 | HR 101 | Wt 233.6 lb

## 2023-05-07 DIAGNOSIS — O99213 Obesity complicating pregnancy, third trimester: Secondary | ICD-10-CM

## 2023-05-07 DIAGNOSIS — E669 Obesity, unspecified: Secondary | ICD-10-CM | POA: Diagnosis not present

## 2023-05-07 DIAGNOSIS — Z3403 Encounter for supervision of normal first pregnancy, third trimester: Secondary | ICD-10-CM

## 2023-05-07 DIAGNOSIS — Z3A39 39 weeks gestation of pregnancy: Secondary | ICD-10-CM

## 2023-05-07 LAB — POCT URINALYSIS DIPSTICK
Bilirubin, UA: NEGATIVE
Blood, UA: NEGATIVE
Glucose, UA: NEGATIVE
Ketones, UA: NEGATIVE
Leukocytes, UA: NEGATIVE
Nitrite, UA: NEGATIVE
Protein, UA: NEGATIVE
Spec Grav, UA: 1.025 (ref 1.010–1.025)
Urobilinogen, UA: 0.2 U/dL
pH, UA: 7.5 (ref 5.0–8.0)
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
## 2023-05-07 NOTE — Patient Instructions (Signed)
Nonstress Test A nonstress test is a procedure that is done during pregnancy in order to check the baby's heartbeat. The procedure can help to show if the baby (fetus) is healthy. It is commonly done when: The baby is past his or her due date. The pregnancy is high risk. The baby is moving less than normal. The mother has lost a pregnancy in the past. The health care provider suspects a problem with the baby's growth. There is too much or too little amniotic fluid. The procedure is often done in the third trimester of pregnancy to find out if an early delivery is needed and whether such a delivery is safe. During a nonstress test, the baby's heartbeat is monitored when the baby is resting and when the baby is moving. If the baby is healthy, the heart rate will increase when he or she moves or kicks and will return to normal when he or she rests. Tell a health care provider about: Any allergies you have. Any medical conditions you have. All medicines you are taking, including vitamins, herbs, eye drops, creams, and over-the-counter medicines. Any surgeries you have had. Any past pregnancies you have had. What are the risks? There are no risks to you or your baby from a nonstress test. This procedure should not be painful or uncomfortable. What happens before the procedure? Eat a meal right before the test or as directed by your health care provider. Food may help encourage the baby to move. Use the restroom right before the test. What happens during the procedure?  Two monitors will be placed on your abdomen. One will record the baby's heart rate and the other will record the contractions of your uterus. You may be asked to lie down on your side or to sit upright. You may be given a button to press when you feel your baby move. Your health care provider will listen to your baby's heartbeat and record it. He or she may also watch your baby's heartbeat on a screen. If the baby seems to be  sleeping, you may be asked to drink some juice or soda, eat a snack, or change positions. The procedure may vary among health care providers and hospitals. What can I expect after procedure? Your health care provider will discuss the test results with you and make recommendations for the future. Depending on the results, your health care provider may order additional tests or another course of action. If your health care provider gave you any diet or activity instructions, make sure to follow them. Keep all follow-up visits. This is important. Summary A nonstress test is a procedure that is done during pregnancy in order to check the baby's heartbeat. The procedure can help show if the baby is healthy. The procedure is often done in the third trimester of pregnancy to find out if an early delivery is needed and whether such a delivery is safe. During a nonstress test, the baby's heartbeat is monitored when the baby is resting and when the baby is moving. If the baby is healthy, the heart rate will increase when he or she moves or kicks and will return to normal when he or she rests. Your health care provider will discuss the test results with you and make recommendations for the future. This information is not intended to replace advice given to you by your health care provider. Make sure you discuss any questions you have with your health care provider. Document Revised: 02/28/2021 Document Reviewed: 03/27/2020 Elsevier Patient   Education  2024 [employer]  

## 2023-05-07 NOTE — Progress Notes (Signed)
    NURSE VISIT NOTE  Subjective:    Patient ID: Angela [name], female    DOB: 03-Nov-2000, 22 y.o.   MRN: [id number]  HPI  Patient is a 22 y.o. G10P0000 female who presents for fetal monitoring per order from Julieanne Manson, MD.   Objective:    BP 135/82   Pulse (!) 101   Ht 5\' 2"  (1.575 m)   Wt 233 lb 9.6 oz (106 kg)   LMP 07/24/2022 (Exact Date)   BMI 42.73 kg/m  Estimated Date of Delivery: 05/12/23  [redacted]w[redacted]d  Fetus A Non-Stress Test Interpretation for 05/07/23  Indication: Obesity  Fetal Heart Rate A Mode: External Baseline Rate (A): 125 bpm Variability: Moderate Accelerations: 15 x 15 Decelerations: None Multiple birth?: No  Uterine Activity Mode: Toco Contraction Frequency (min): rare Contraction Duration (sec): 110 Contraction Quality: Mild Resting Time: Adequate  Interpretation (Fetal Testing) Nonstress Test Interpretation: Reactive Overall Impression: Reassuring for gestational age   Assessment:   1. Obesity affecting pregnancy in third trimester, unspecified obesity type   2. [redacted] weeks gestation of pregnancy      Plan:   Results reviewed and discussed with patient by  Carie Caddy, CNM.     Rocco Serene, LPN

## 2023-05-08 ENCOUNTER — Encounter: Payer: Self-pay | Admitting: Licensed Practical Nurse

## 2023-05-08 ENCOUNTER — Inpatient Hospital Stay
Admission: RE | Admit: 2023-05-08 | Discharge: 2023-05-11 | DRG: 806 | Disposition: A | Discharge status: Home / Self Care | Payer: Medicaid Other | Attending: Obstetrics | Admitting: Obstetrics

## 2023-05-08 ENCOUNTER — Other Ambulatory Visit: Payer: Self-pay

## 2023-05-08 ENCOUNTER — Encounter: Payer: Self-pay | Admitting: Obstetrics

## 2023-05-08 DIAGNOSIS — O99354 Diseases of the nervous system complicating childbirth: Secondary | ICD-10-CM | POA: Diagnosis not present

## 2023-05-08 DIAGNOSIS — Z818 Family history of other mental and behavioral disorders: Secondary | ICD-10-CM

## 2023-05-08 DIAGNOSIS — Z801 Family history of malignant neoplasm of trachea, bronchus and lung: Secondary | ICD-10-CM | POA: Diagnosis not present

## 2023-05-08 DIAGNOSIS — Z820 Family history of epilepsy and other diseases of the nervous system: Secondary | ICD-10-CM

## 2023-05-08 DIAGNOSIS — O99214 Obesity complicating childbirth: Secondary | ICD-10-CM | POA: Diagnosis not present

## 2023-05-08 DIAGNOSIS — Z8041 Family history of malignant neoplasm of ovary: Secondary | ICD-10-CM

## 2023-05-08 DIAGNOSIS — Z825 Family history of asthma and other chronic lower respiratory diseases: Secondary | ICD-10-CM | POA: Diagnosis not present

## 2023-05-08 DIAGNOSIS — Z23 Encounter for immunization: Secondary | ICD-10-CM

## 2023-05-08 DIAGNOSIS — Z833 Family history of diabetes mellitus: Secondary | ICD-10-CM

## 2023-05-08 DIAGNOSIS — O358XX0 Maternal care for other (suspected) fetal abnormality and damage, not applicable or unspecified: Secondary | ICD-10-CM | POA: Diagnosis present

## 2023-05-08 DIAGNOSIS — Z3A39 39 weeks gestation of pregnancy: Secondary | ICD-10-CM

## 2023-05-08 DIAGNOSIS — G8220 Paraplegia, unspecified: Secondary | ICD-10-CM | POA: Diagnosis not present

## 2023-05-08 DIAGNOSIS — Z8249 Family history of ischemic heart disease and other diseases of the circulatory system: Secondary | ICD-10-CM

## 2023-05-08 DIAGNOSIS — O99213 Obesity complicating pregnancy, third trimester: Secondary | ICD-10-CM | POA: Diagnosis present

## 2023-05-08 DIAGNOSIS — Z3403 Encounter for supervision of normal first pregnancy, third trimester: Principal | ICD-10-CM

## 2023-05-08 DIAGNOSIS — E669 Obesity, unspecified: Secondary | ICD-10-CM | POA: Diagnosis not present

## 2023-05-08 HISTORY — DX: Obesity complicating pregnancy, third trimester: O99.213

## 2023-05-08 LAB — CBC
HCT: 33.2 % — ABNORMAL LOW (ref 36.0–46.0)
Hemoglobin: 11.2 g/dL — ABNORMAL LOW (ref 12.0–15.0)
MCH: 29.9 pg (ref 26.0–34.0)
MCHC: 33.7 g/dL (ref 30.0–36.0)
MCV: 88.8 fL (ref 80.0–100.0)
Platelets: 301 10*3/uL (ref 150–400)
RBC: 3.74 MIL/uL — ABNORMAL LOW (ref 3.87–5.11)
RDW: 13.4 % (ref 11.5–15.5)
WBC: 13.6 10*3/uL — ABNORMAL HIGH (ref 4.0–10.5)
nRBC: 0 % (ref 0.0–0.2)

## 2023-05-08 LAB — URINE DRUG SCREEN, QUALITATIVE (ARMC ONLY)
Amphetamines, Ur Screen: NOT DETECTED
Barbiturates, Ur Screen: NOT DETECTED
Benzodiazepine, Ur Scrn: NOT DETECTED
Cannabinoid 50 Ng, Ur ~~LOC~~: NOT DETECTED
Cocaine Metabolite,Ur ~~LOC~~: NOT DETECTED
MDMA (Ecstasy)Ur Screen: NOT DETECTED
Methadone Scn, Ur: NOT DETECTED
Opiate, Ur Screen: NOT DETECTED
Phencyclidine (PCP) Ur S: NOT DETECTED
Tricyclic, Ur Screen: NOT DETECTED

## 2023-05-08 LAB — URINALYSIS, ROUTINE W REFLEX MICROSCOPIC
Bilirubin Urine: NEGATIVE
Glucose, UA: NEGATIVE mg/dL
Hgb urine dipstick: NEGATIVE
Ketones, ur: NEGATIVE mg/dL
Leukocytes,Ua: NEGATIVE
Nitrite: NEGATIVE
Protein, ur: NEGATIVE mg/dL
Specific Gravity, Urine: 1.026 (ref 1.005–1.030)
pH: 6 (ref 5.0–8.0)

## 2023-05-08 LAB — TYPE AND SCREEN
ABO/RH(D): O POS
Antibody Screen: NEGATIVE

## 2023-05-08 MED ORDER — BUTORPHANOL TARTRATE 2 MG/ML IJ SOLN
1.0000 mg | INTRAMUSCULAR | Status: DC | PRN
  Administered 2023-05-08 – 2023-05-09 (×3): 1 mg via INTRAVENOUS
  Filled 2023-05-08 (×3): qty 1

## 2023-05-08 MED ORDER — LACTATED RINGERS IV SOLN: 500.0000 mL | INTRAVENOUS | Status: DC | PRN

## 2023-05-08 MED ORDER — OXYTOCIN 10 UNIT/ML IJ SOLN
INTRAMUSCULAR | Status: AC
  Filled 2023-05-08: qty 2

## 2023-05-08 MED ORDER — LACTATED RINGERS IV SOLN: INTRAVENOUS | Status: DC

## 2023-05-08 MED ORDER — ACETAMINOPHEN 500 MG PO TABS: 1000.0000 mg | ORAL_TABLET | Freq: Four times a day (QID) | ORAL | Status: DC | PRN

## 2023-05-08 MED ORDER — LIDOCAINE HCL (PF) 1 % IJ SOLN
30.0000 mL | INTRAMUSCULAR | Status: AC | PRN
  Administered 2023-05-09: 30 mL via SUBCUTANEOUS

## 2023-05-08 MED ORDER — CALCIUM CARBONATE ANTACID 500 MG PO CHEW: 2.0000 | CHEWABLE_TABLET | Freq: Three times a day (TID) | ORAL | Status: DC | PRN

## 2023-05-08 MED ORDER — ONDANSETRON HCL 4 MG/2ML IJ SOLN
4.0000 mg | Freq: Four times a day (QID) | INTRAMUSCULAR | Status: DC | PRN
  Administered 2023-05-09: 4 mg via INTRAVENOUS
  Filled 2023-05-08: qty 2

## 2023-05-08 MED ORDER — MISOPROSTOL 25 MCG QUARTER TABLET: 25.0000 ug | ORAL_TABLET | Freq: Once | ORAL | Status: DC

## 2023-05-08 MED ORDER — AMMONIA AROMATIC IN INHA
RESPIRATORY_TRACT | Status: AC
  Filled 2023-05-08: qty 10

## 2023-05-08 MED ORDER — OXYTOCIN-SODIUM CHLORIDE 30-0.9 UT/500ML-% IV SOLN
1.0000 m[IU]/min | INTRAVENOUS | Status: DC
  Administered 2023-05-08: 2 m[IU]/min via INTRAVENOUS
  Filled 2023-05-08: qty 1000

## 2023-05-08 MED ORDER — SOD CITRATE-CITRIC ACID 500-334 MG/5ML PO SOLN: 30.0000 mL | ORAL | Status: DC | PRN

## 2023-05-08 MED ORDER — TERBUTALINE SULFATE 1 MG/ML IJ SOLN: 0.2500 mg | Freq: Once | INTRAMUSCULAR | Status: DC | PRN

## 2023-05-08 MED ORDER — OXYTOCIN-SODIUM CHLORIDE 30-0.9 UT/500ML-% IV SOLN
2.5000 [IU]/h | INTRAVENOUS | Status: DC
  Administered 2023-05-09: 2.5 [IU]/h via INTRAVENOUS

## 2023-05-08 MED ORDER — MISOPROSTOL 200 MCG PO TABS
ORAL_TABLET | ORAL | Status: AC
  Filled 2023-05-08: qty 4

## 2023-05-08 MED ORDER — FENTANYL CITRATE (PF) 100 MCG/2ML IJ SOLN: 50.0000 ug | INTRAMUSCULAR | Status: DC | PRN

## 2023-05-08 MED ORDER — HYDROXYZINE HCL 25 MG PO TABS: 50.0000 mg | ORAL_TABLET | Freq: Four times a day (QID) | ORAL | Status: DC | PRN

## 2023-05-08 MED ORDER — MISOPROSTOL 50MCG HALF TABLET: 50.0000 ug | ORAL_TABLET | ORAL | Status: DC | PRN

## 2023-05-08 MED ORDER — OXYTOCIN BOLUS FROM INFUSION
333.0000 mL | Freq: Once | INTRAVENOUS | Status: AC
  Administered 2023-05-09: 333 mL via INTRAVENOUS

## 2023-05-08 MED ORDER — LIDOCAINE HCL (PF) 1 % IJ SOLN
INTRAMUSCULAR | Status: AC
  Filled 2023-05-08: qty 30

## 2023-05-08 NOTE — Progress Notes (Signed)
Angela [name] is a 22 y.o. G1P0000 at [redacted]w[redacted]d by ultrasound admitted for induction of labor due to high BMI (42).  Subjective:  She is feeling contractions and describes these as mild. Able to talk through them.She has three suppport people at the bedside  Objective: BP 129/79 (BP Location: Left Arm)   Pulse 87   Temp 98.4 F (36.9 C) (Oral)   Resp 17   Ht 5\' 2"  (1.575 m)   Wt 106 kg   LMP 07/24/2022 (Exact Date)   BMI 42.73 kg/m  No intake/output data recorded. No intake/output data recorded.  FHT:  FHR: 130 bpm, variability: moderate,  accelerations:  Present,  decelerations:  Absent UC:   regular, every 2-3.5 minutes SVE:   Dilation: 3.5 Effacement (%): 70 Station: -2 Exam by:: Toney Sang RN Pitocin running at 6 mu/min Labs: Lab Results  Component Value Date   WBC 13.6 (H) 05/08/2023   HGB 11.2 (L) 05/08/2023   HCT 33.2 (L) 05/08/2023   MCV 88.8 05/08/2023   PLT 301 05/08/2023    Assessment / Plan: Induction of labor due to BMI,  progressing well on pitocin  Labor: Progressing on Pitocin, will continue to increase then AROM once more dilated  Fetal Wellbeing:  Category I Pain Control:  Labor support without medications I/D:  n/a Anticipated MOD:  NSVD  Mirna Mires, CNM 05/08/2023, 9:17 PM
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
## 2023-05-08 NOTE — Progress Notes (Signed)
Angela [name] is a 22 y.o. G1P0000 at [redacted]w[redacted]d by ultrasound admitted for induction of labor due to high BMI.she continues on pitocin.  Subjective:  Angela [name] is more uncomfortable. She recently received a dose of IV Stadol. Her preference is for less medication use in labor. Due to preexisting conditions. She is unlikely to receive an epidural.Feeling her contractions in her lower abdomen. Objective: BP 129/79 (BP Location: Left Arm)   Pulse 87   Temp 98.4 F (36.9 C) (Oral)   Resp 17   Ht 5\' 2"  (1.575 m)   Wt 106 kg   LMP 07/24/2022 (Exact Date)   BMI 42.73 kg/m  No intake/output data recorded. No intake/output data recorded.  FHT:  FHR: 135 baseline bpm, variability: moderate,  accelerations:  Present,  decelerations:  Absent UC:   regular, every 1.5-2  minutes SVE:   Dilation: 4 Effacement (%): 70 Station: -2 Exam by:: Eunice Blase CNM Contractions palpate moderate in strength. Labs: Lab Results  Component Value Date   WBC 13.6 (H) 05/08/2023   HGB 11.2 (L) 05/08/2023   HCT 33.2 (L) 05/08/2023   MCV 88.8 05/08/2023   PLT 301 05/08/2023    Assessment / Plan:   Labor: Progressing normally. Titrating pitocin  to achieve moderate strength contractions q 3 minutes.  Fetal Wellbeing:  Category I Pain Control:  IV pain meds I/D:  n/a Anticipated MOD:  NSVD  Mirna Mires, CNM 05/08/2023, 11:18 PM

## 2023-05-09 ENCOUNTER — Encounter: Payer: Self-pay | Admitting: Obstetrics

## 2023-05-09 DIAGNOSIS — O99214 Obesity complicating childbirth: Secondary | ICD-10-CM | POA: Diagnosis not present

## 2023-05-09 DIAGNOSIS — E669 Obesity, unspecified: Secondary | ICD-10-CM | POA: Diagnosis not present

## 2023-05-09 DIAGNOSIS — Z3A39 39 weeks gestation of pregnancy: Secondary | ICD-10-CM | POA: Diagnosis not present

## 2023-05-09 LAB — RPR: RPR Ser Ql: NONREACTIVE

## 2023-05-09 MED ORDER — BACLOFEN 10 MG PO TABS
10.0000 mg | ORAL_TABLET | Freq: Every day | ORAL | Status: DC
  Administered 2023-05-09 – 2023-05-10 (×2): 10 mg via ORAL
  Filled 2023-05-09 (×3): qty 1

## 2023-05-09 MED ORDER — GABAPENTIN 300 MG PO CAPS
400.0000 mg | ORAL_CAPSULE | Freq: Three times a day (TID) | ORAL | Status: DC
  Administered 2023-05-09 (×2): 400 mg via ORAL
  Filled 2023-05-09 (×3): qty 1

## 2023-05-09 MED ORDER — ONDANSETRON HCL 4 MG PO TABS: 4.0000 mg | ORAL_TABLET | ORAL | Status: DC | PRN

## 2023-05-09 MED ORDER — ACETAMINOPHEN 325 MG PO TABS
650.0000 mg | ORAL_TABLET | ORAL | Status: DC | PRN
  Administered 2023-05-09 – 2023-05-11 (×6): 650 mg via ORAL
  Filled 2023-05-09 (×6): qty 2

## 2023-05-09 MED ORDER — MEASLES, MUMPS & RUBELLA VAC IJ SOLR
0.5000 mL | Freq: Once | INTRAMUSCULAR | Status: AC
  Administered 2023-05-11: 0.5 mL via SUBCUTANEOUS
  Filled 2023-05-09 (×3): qty 0.5

## 2023-05-09 MED ORDER — DIPHENHYDRAMINE HCL 25 MG PO CAPS: 25.0000 mg | ORAL_CAPSULE | Freq: Four times a day (QID) | ORAL | Status: DC | PRN

## 2023-05-09 MED ORDER — METHYLERGONOVINE MALEATE 0.2 MG/ML IJ SOLN: 0.2000 mg | INTRAMUSCULAR | Status: DC | PRN

## 2023-05-09 MED ORDER — PRENATAL MULTIVITAMIN CH
1.0000 | ORAL_TABLET | Freq: Every day | ORAL | Status: DC
Start: 2023-05-09 — End: 2023-05-11
  Administered 2023-05-09 – 2023-05-11 (×3): 1 via ORAL
  Filled 2023-05-09 (×3): qty 1

## 2023-05-09 MED ORDER — IBUPROFEN 600 MG PO TABS
600.0000 mg | ORAL_TABLET | Freq: Four times a day (QID) | ORAL | Status: DC
Start: 2023-05-09 — End: 2023-05-11
  Administered 2023-05-09 – 2023-05-11 (×8): 600 mg via ORAL
  Filled 2023-05-09 (×8): qty 1

## 2023-05-09 MED ORDER — ESCITALOPRAM OXALATE 10 MG PO TABS
10.0000 mg | ORAL_TABLET | Freq: Every day | ORAL | Status: DC
  Administered 2023-05-10 – 2023-05-11 (×2): 10 mg via ORAL
  Filled 2023-05-09 (×3): qty 1

## 2023-05-09 MED ORDER — CALCIUM CARBONATE ANTACID 500 MG PO CHEW: 2.0000 | CHEWABLE_TABLET | ORAL | Status: DC | PRN

## 2023-05-09 MED ORDER — DOCUSATE SODIUM 100 MG PO CAPS
100.0000 mg | ORAL_CAPSULE | Freq: Two times a day (BID) | ORAL | Status: DC
Start: 2023-05-09 — End: 2023-05-11
  Administered 2023-05-09 – 2023-05-11 (×4): 100 mg via ORAL
  Filled 2023-05-09 (×4): qty 1

## 2023-05-09 MED ORDER — SIMETHICONE 80 MG PO CHEW: 80.0000 mg | CHEWABLE_TABLET | ORAL | Status: DC | PRN

## 2023-05-09 MED ORDER — METHYLERGONOVINE MALEATE 0.2 MG PO TABS: 0.2000 mg | ORAL_TABLET | ORAL | Status: DC | PRN

## 2023-05-09 MED ORDER — OXYTOCIN-SODIUM CHLORIDE 30-0.9 UT/500ML-% IV SOLN: 2.5000 [IU]/h | INTRAVENOUS | Status: DC | PRN

## 2023-05-09 MED ORDER — ONDANSETRON HCL 4 MG/2ML IJ SOLN: 4.0000 mg | INTRAMUSCULAR | Status: DC | PRN

## 2023-05-09 MED ORDER — BENZOCAINE-MENTHOL 20-0.5 % EX AERO
1.0000 | INHALATION_SPRAY | CUTANEOUS | Status: DC | PRN
  Administered 2023-05-09: 1 via TOPICAL
  Filled 2023-05-09 (×2): qty 56

## 2023-05-09 MED ORDER — WITCH HAZEL-GLYCERIN EX PADS: MEDICATED_PAD | CUTANEOUS | Status: DC | PRN

## 2023-05-09 MED ORDER — VARICELLA VIRUS VACCINE LIVE 1350 PFU/0.5ML IJ SUSR
0.5000 mL | Freq: Once | INTRAMUSCULAR | Status: AC
  Administered 2023-05-11: 0.5 mL via SUBCUTANEOUS
  Filled 2023-05-09 (×3): qty 0.5

## 2023-05-09 NOTE — Lactation Note (Addendum)
This note was copied from a baby's chart. Lactation Consultation Note  Patient Name: Boy Deniss Freundlich [id number] Date: 05/09/2023 Age:24 hours Reason for consult: L&D Initial assessment;1st time breastfeeding;Term   Maternal Data Has patient been taught Hand Expression?: Yes Does the patient have breastfeeding experience prior to this delivery?: No  Lactation to room to assist patient with first breastfeeding session and for an initial assessment.  This is P1 patient and less than an hr old baby boy "[place]".  This was a SVD.  Feeding goal is breastfeeding.    Patient stated that she does have a [employer] pump at home that is both electric and hands Kataryna Mcquilkin.   FOB voiced some concern on how to feed infant once they get home and mom is moving around with the wheelchair.    Feeding Mother's Current Feeding Choice: Breast Milk  Infant STS upon entry into the room. LC assisted patient with a feeding on the rt breast.  Infant was put in football hold with support pillows.  Infant opens mouth wide and latches with lips flanged out at the breast. Infant actively ate at the breast with swallows heard. Patient burped infant and then put infant on the left breast where he opens mouth wide and latches.   Patient has large breast, and the football hold may be the best hold to help her with feeding infant.  Patient voiced concerned of breast smothering infant while nursing.  LC demonstrated ways to support breast and sandwich breast so patient could see nostrils and feel safe in feeding her infant.  FOB was taught at well.   LATCH Score Latch: Grasps breast easily, tongue down, lips flanged, rhythmical sucking.  Audible Swallowing: Spontaneous and intermittent  Type of Nipple: Everted at rest and after stimulation  Comfort (Breast/Nipple): Soft / non-tender  Hold (Positioning): No assistance needed to correctly position infant at breast.  LATCH Score: 10  Interventions Interventions: Assisted with  latch;Breast feeding basics reviewed;Skin to skin;Breast massage;Hand express;Breast compression;Adjust position;Support pillows;Position options;Education  LC provided education on the following;  milk production expectations, hunger cues, day 1/2 wet/dirty diapers, hand expression, benefits of STS and arousing infant for a feeding.  Lactation informed patient of feeding infant at least 8 or more times w/in a 24hr period but not exceeding 3hrs. Patient verbalized understanding.   LC reviewed infant behavior in the first 24hrs.   Discharge Pump: Personal;DEBP WIC Program: Yes Aaron Edelman)  Consult Status Consult Status: Follow-up Follow-up type: In-patient    Yvette Rack Itzayana Pardy 05/09/2023, 1:24 PM

## 2023-05-09 NOTE — Progress Notes (Signed)
Angela [name] is a 22 y.o. G1P0000 at [redacted]w[redacted]d who has continued with pitocin infusion  for an induced labor. She has recently received a second dose of Stadol.  Subjective: She has rested some but is up and now using the labor ball.   Objective: BP 129/79 (BP Location: Left Arm)   Pulse 87   Temp 98.4 F (36.9 C) (Oral)   Resp 17   Ht 5\' 2"  (1.575 m)   Wt 106 kg   LMP 07/24/2022 (Exact Date)   BMI 42.73 kg/m  No intake/output data recorded. No intake/output data recorded.  FHT:  FHR: 115 bpm, variability: moderate,  accelerations:  Abscent,  decelerations:  Absent decreased variability since Stadol. UC:   regular, every 2.5 minutes, moderate intensity SVE:   5cms/100%/-2station Pitocin runs at 12 mu/min Labs: Lab Results  Component Value Date   WBC 13.6 (H) 05/08/2023   HGB 11.2 (L) 05/08/2023   HCT 33.2 (L) 05/08/2023   MCV 88.8 05/08/2023   PLT 301 05/08/2023    Assessment / Plan: Cervical change. Favorable for AROM. AROM at 0645- copious clear fluid seen, + bloody show Cervical sweep performed.  Fetal Wellbeing:  Category II Pain Control:  IV pain meds I/D:  n/a Anticipated MOD:  NSVD  Mirna Mires, CNM 05/09/2023, 6:20 AM

## 2023-05-09 NOTE — Discharge Summary (Signed)
Postpartum Discharge Summary  Date of Service updated***     Patient Name: Angela [name] DOB: [birth year] MRN: [id number]  Date of admission: 05/08/2023 Delivery date:05/09/2023 Delivering provider: Glenetta Borg Date of discharge: 05/09/2023  Admitting diagnosis: Obesity complicating pregnancy in third trimester [O99.213] Intrauterine pregnancy: [redacted]w[redacted]d     Secondary diagnosis:  Principal Problem:   Obesity complicating pregnancy in third trimester  Additional problems: None    Discharge diagnosis: Term Pregnancy Delivered                                              Post partum procedures:{Postpartum procedures:23558} Augmentation: AROM, Pitocin, and Cytotec Complications: None  Hospital course: Induction of Labor With Vaginal Delivery   22 y.o. yo G1P1001 at [redacted]w[redacted]d was admitted to the hospital 05/08/2023 for induction of labor.  Indication for induction:  elevated BMI .  Patient had an uncomplicated labor course  Membrane Rupture Time/Date: 6:45 AM,05/09/2023  Delivery Method:Vaginal, Spontaneous Operative Delivery:N/A Episiotomy: None Lacerations:  Labial Details of delivery can be found in separate delivery note.  Patient had a postpartum course complicated by***. Patient is discharged home 05/09/23.  Newborn Data: Birth date:05/09/2023 Birth time:10:00 AM Gender:Female Living status:Living Apgars:8 ,9  Weight:3090 g  Magnesium Sulfate received: No BMZ received: No Rhophylac:N/A MMR:{MMR:30440033} T-DaP:Given prenatally Flu: Yes RSV Vaccine received: Yes Transfusion:{Transfusion received:30440034} Immunizations administered: Immunization History  Administered Date(s) Administered   DTaP 05/09/1999, 08/09/1999, 11/06/1999, 07/23/2000, 09/26/2003   HIB (PRP-OMP) 05/09/1999, 08/09/1999, 11/06/1999, 03/10/2000   Hepatitis A 03/19/2007, 06/08/2008   Hepatitis B 11/06/1999, 03/10/2000, 05/09/2007   IPV 05/09/1999, 08/09/1999, 11/06/1999, 09/26/2003   Influenza,  Seasonal, Injecte, Preservative Fre 03/26/2023   Influenza,inj,Quad PF,6+ Mos 04/27/2021, 07/04/2022   Influenza-Unspecified 04/16/2017, 03/26/2018, 04/08/2019   MMR 07/23/2000, 09/26/2003   Meningococcal B, Unspecified 10/31/2015, 03/26/2018   Meningococcal Conjugate 10/31/2015   Rsv, Bivalent, Protein Subunit Rsvpref,pf Verdis Frederickson) 03/26/2023   Tdap 02/21/2010, 09/13/2020, 02/10/2023   Varicella 09/26/2003, 06/08/2008    Physical exam  Vitals:   05/09/23 1316 05/09/23 1444 05/09/23 1633 05/09/23 2029  BP: 114/74 109/71 114/69 124/69  Pulse: 88 91 96 96  Resp: 16  20 20   Temp: 98 F (36.7 C) 98.3 F (36.8 C) 98.3 F (36.8 C) 98.6 F (37 C)  TempSrc: Axillary Oral Oral Oral  SpO2: 99% 99% 100% 99%  Weight:      Height:       General: {Exam; general:21111117} Lochia: {Desc; appropriate/inappropriate:30686::"appropriate"} Uterine Fundus: {Desc; firm/soft:30687} Incision: {Exam; incision:21111123} DVT Evaluation: {Exam; dvt:2111122} Labs: Lab Results  Component Value Date   WBC 13.6 (H) 05/08/2023   HGB 11.2 (L) 05/08/2023   HCT 33.2 (L) 05/08/2023   MCV 88.8 05/08/2023   PLT 301 05/08/2023      Latest Ref Rng & Units 07/04/2022    4:17 PM  CMP  Glucose 70 - 99 mg/dL 71   BUN 6 - 20 mg/dL 9   Creatinine 6.38 - 7.56 mg/dL 4.33   Sodium 295 - 188 mmol/L 139   Potassium 3.5 - 5.2 mmol/L 4.2   Chloride 96 - 106 mmol/L 100   CO2 20 - 29 mmol/L 21   Calcium 8.7 - 10.2 mg/dL 9.4   Total Protein 6.0 - 8.5 g/dL 6.3   Total Bilirubin 0.0 - 1.2 mg/dL 0.2   Alkaline Phos 44 - 121 IU/L 111  AST 0 - 40 IU/L 27   ALT 0 - 32 IU/L 42    Edinburgh Score:    09/23/2022    9:31 AM  Edinburgh Postnatal Depression Scale Screening Tool  I have been able to laugh and see the funny side of things. 0  I have looked forward with enjoyment to things. 0  I have blamed myself unnecessarily when things went wrong. 0  I have been anxious or worried for no good reason. 0  I have felt  scared or panicky for no good reason. 0  Things have been getting on top of me. 1  I have been so unhappy that I have had difficulty sleeping. 0  I have felt sad or miserable. 0  I have been so unhappy that I have been crying. 0  The thought of harming myself has occurred to me. 0  Edinburgh Postnatal Depression Scale Total 1      After visit meds:  Allergies as of 05/09/2023       Reactions   Vancomycin Rash   Had red itchy rash of face and neck Had red itchy rash of face and neck Had red itchy rash of face and neck   Latex Rash     Med Rec must be completed prior to using this SMARTLINK***        Discharge home in stable condition Infant Feeding: {Baby feeding:23562} Infant Disposition:{CHL IP OB HOME WITH JJOACZ:66063} Discharge instruction: per After Visit Summary and Postpartum booklet. Activity: Advance as tolerated. Pelvic rest for 6 weeks.  Diet: {OB diet:21111121} Anticipated Birth Control: {Birth Control:23956} Postpartum Appointment:{Outpatient follow up:23559} Additional Postpartum F/U: {PP Procedure:23957} Future Appointments:No future appointments. Follow up Visit:  Follow-up Information     Glenetta Borg, CNM. Schedule an appointment as soon as possible for a visit.   Specialty: Obstetrics Why: Video visit in 2 weeks Office visit in 6 weeks Contact information: [address] [place] [address] [phone]                     05/09/2023 Glenetta Borg, CNM

## 2023-05-09 NOTE — Progress Notes (Signed)
Labor Progress Note   ASSESSMENT/PLAN   LYLIANAH SIKKENGA 22 y.o.   G1P0000  at [redacted]w[redacted]d here for IOL.  FWB:  - Fetal well being assessed: Category 1        GBS: - GBS negative  LABOR: -  Completely dilated, ready to push - Pain Management: Labor support without medications - Anticipate SVD    Principal Problem:   Obesity complicating pregnancy in third trimester   SUBJECTIVE/OBJECTIVE   SUBJECTIVE:     OBJECTIVE: Vital Signs: Patient Vitals for the past 12 hrs:  BP Temp Temp src Pulse Resp  05/09/23 0709 132/77 97.6 F (36.4 C) Oral 92 18    Last SVE:  Dilation: 10 Effacement (%): 100 Cervical Position: Anterior Station: Plus 2 Presentation: Vertex Exam by:: LSE -  , Rupture Date: 05/09/23, Rupture Time: 0645,    FHR:   - Mode: External  - Baseline - 120 , moderate variability  - Characteristics (ie - accels, decels): Accelerations - present , decels - early  UTERINE ACTIVITY:   - Mode: Toco  - Contraction Frequency (min): 1.5-2 minutes  Glenetta Borg, CNM

## 2023-05-10 MED ORDER — GABAPENTIN 300 MG PO CAPS: 800.0000 mg | ORAL_CAPSULE | Freq: Three times a day (TID) | ORAL | Status: DC

## 2023-05-10 MED ORDER — GABAPENTIN 300 MG PO CAPS
800.0000 mg | ORAL_CAPSULE | Freq: Three times a day (TID) | ORAL | Status: DC
  Administered 2023-05-10 – 2023-05-11 (×4): 800 mg via ORAL
  Filled 2023-05-10 (×4): qty 2

## 2023-05-10 NOTE — Progress Notes (Signed)
Post Partum Day 1 Subjective: no complaints, up ad lib, voiding, tolerating PO, and + flatus. Has vaginal pain and pressure, sitting up makes it worse. Using PRN medications. Breastfeeding independently. Family at the bedside. Mhia would like to stay one more day.   Objective: Blood pressure 103/67, pulse 88, temperature 98.2 F (36.8 C), temperature source Oral, resp. rate 17, height 5\' 2"  (1.575 m), weight 106 kg, last menstrual period 07/24/2022, SpO2 99%, unknown if currently breastfeeding.  Physical Exam:  General: alert and cooperative Breasts: soft, no redness or masses. Nipples erect and intact bilaterally. Lochia: appropriate Uterine Fundus: firm M U/2 Perineum: min swelling. Hematoma not visible on exam, maybe internal. DVT Evaluation: No evidence of DVT seen on physical exam. +BLE   Recent Labs    05/08/23 1318  HGB 11.2*  HCT 33.2*    Assessment/Plan: Plan for discharge tomorrow, Breastfeeding, Lactation consult, and Contraception POP's   LOS: 2 days   Ellouise Newer [facility], CNM 05/10/2023, 10:26 AM

## 2023-05-10 NOTE — Lactation Note (Signed)
This note was copied from a baby's chart. Lactation Consultation Note  Patient Name: Angela [name] WGNFA'O Date: 05/10/2023 Age:24 hours Reason for consult: Follow-up assessment;Mother's request;Term;Primapara   Maternal Data This is mom's 1st baby, SVD.  Mom with history of anxiety, depression, and paraplegia.  On follow-up today mom reports baby is latching and breastfeeding well. Baby per parents has had wet and multiple stool diapers. Mom would like assistance with learning different breastfeeding positions. Mom to call [facility] when she would like assistance with breastfeeding positions. Has patient been taught Hand Expression?: Yes Does the patient have breastfeeding experience prior to this delivery?: No  Feeding Mother's Current Feeding Choice: Breast Milk  Interventions Interventions: Breast feeding basics reviewed;Position options;Education  Discharge Discharge Education: Engorgement and breast care;Warning signs for feeding baby;Outpatient recommendation Pump: Personal WIC Program: Yes  Consult Status Consult Status: PRN Date: 05/10/23 Follow-up type: In-patient  Update provided to care nurse.  Fuller Song 05/10/2023, 1:21 PM

## 2023-05-11 MED ORDER — IBUPROFEN 600 MG PO TABS: 600.0000 mg | ORAL_TABLET | Freq: Four times a day (QID) | ORAL | 0 refills | Status: DC

## 2023-05-11 NOTE — Progress Notes (Signed)
Pt discharged with infant.  Discharge instructions, prescriptions and follow up appointment given to and reviewed with pt. Pt verbalized understanding. Escorted out by auxillary. 

## 2023-05-23 ENCOUNTER — Telehealth (INDEPENDENT_AMBULATORY_CARE_PROVIDER_SITE_OTHER): Payer: Medicaid Other | Admitting: Obstetrics

## 2023-05-23 DIAGNOSIS — Z1332 Encounter for screening for maternal depression: Secondary | ICD-10-CM | POA: Diagnosis not present

## 2023-05-23 NOTE — Progress Notes (Signed)
   Virtual Visit via Video Note   I connected with Angela [name] on 05/23/23 11:02 AM by a video enabled telemedicine application and verified that I am speaking with the correct person using two identifiers.   Location: Patient: home Provider: [facility]   I discussed the limitations of evaluation and management by telemedicine and the availability of in person appointments. The patient expressed understanding and agreed to proceed. Subjective:    Subjective  Angela [name] is a 22 y.o. female who presents for a postpartum visit. She is 2 weeks  postpartum following a spontaneous vaginal delivery. I have fully reviewed the prenatal and intrapartum course. The delivery was at [redacted]w[redacted]d .  Anesthesia: none.    Postpartum course has been normal. She has no pain and no other concerns. Baby's course has been normal. Baby is feeding by  breast. Bleeding  is light . Bowel function is normal. Bladder function is normal.  .  Contraception method: oral progesterone-only contraceptive. Postpartum depression screening: negative She reports a small, itchy rash on her breast. The breast is soft underneath the rash, no s/s of mastitis.    Edinburgh Postnatal Depression Scale - 05/23/23 1054       Edinburgh Postnatal Depression Scale:  In the Past 7 Days   I have been able to laugh and see the funny side of things. 0    I have looked forward with enjoyment to things. 2    I have blamed myself unnecessarily when things went wrong. 2    I have been anxious or worried for no good reason. 0    I have felt scared or panicky for no good reason. 0    Things have been getting on top of me. 0    I have been so unhappy that I have had difficulty sleeping. 0    I have felt sad or miserable. 0    I have been so unhappy that I have been crying. 0    The thought of harming myself has occurred to me. 0    Edinburgh Postnatal Depression Scale Total 4               Review of Systems Pertinent positives  noted in HPI. Remainder of comprehensive ROS otherwise negative.     Objective:      Objective [] Expand by Default There were no vitals taken for this visit.  General:  alert, cooperative, and no distress        Assessment:    Normal postpartum course, mood stable.   Plan:      Plan -Anticipatory guidance about the postpartum period -Hydrocortisone/emollient for rash. Make appt if no improvement -Reviewed s/s of PPD and PPA -Discussed when to seek medical care -Office visit at 6 weeks postpartum  I discussed the assessment and treatment plan with the patient. The patient was provided an opportunity to ask questions and all were answered. The patient agreed with the plan and demonstrated an understanding of the instructions.   The patient was advised to call back or seek an in-person evaluation if the symptoms worsen or if the condition fails to improve as anticipated.   I provided 7 minutes of non-face-to-face time during this encounter.     Glenetta Borg, CNM

## 2023-06-01 DIAGNOSIS — Z419 Encounter for procedure for purposes other than remedying health state, unspecified: Secondary | ICD-10-CM | POA: Diagnosis not present

## 2023-06-08 ENCOUNTER — Other Ambulatory Visit: Payer: Self-pay | Admitting: Family Medicine

## 2023-06-08 DIAGNOSIS — G8220 Paraplegia, unspecified: Secondary | ICD-10-CM

## 2023-06-08 DIAGNOSIS — S34109S Unspecified injury to unspecified level of lumbar spinal cord, sequela: Secondary | ICD-10-CM

## 2023-06-08 DIAGNOSIS — M792 Neuralgia and neuritis, unspecified: Secondary | ICD-10-CM

## 2023-06-08 DIAGNOSIS — M62838 Other muscle spasm: Secondary | ICD-10-CM

## 2023-06-08 DIAGNOSIS — F419 Anxiety disorder, unspecified: Secondary | ICD-10-CM

## 2023-06-08 DIAGNOSIS — Z23 Encounter for immunization: Secondary | ICD-10-CM

## 2023-06-08 DIAGNOSIS — Z1159 Encounter for screening for other viral diseases: Secondary | ICD-10-CM

## 2023-06-08 DIAGNOSIS — G839 Paralytic syndrome, unspecified: Secondary | ICD-10-CM

## 2023-06-08 DIAGNOSIS — F339 Major depressive disorder, recurrent, unspecified: Secondary | ICD-10-CM

## 2023-06-08 DIAGNOSIS — E559 Vitamin D deficiency, unspecified: Secondary | ICD-10-CM

## 2023-06-09 NOTE — Telephone Encounter (Signed)
Last OV-10/08/22. Please advise

## 2023-06-20 ENCOUNTER — Encounter: Payer: Self-pay | Admitting: Obstetrics

## 2023-06-20 ENCOUNTER — Ambulatory Visit (INDEPENDENT_AMBULATORY_CARE_PROVIDER_SITE_OTHER): Payer: Medicaid Other | Admitting: Obstetrics

## 2023-06-20 DIAGNOSIS — D508 Other iron deficiency anemias: Secondary | ICD-10-CM | POA: Diagnosis not present

## 2023-06-20 DIAGNOSIS — M533 Sacrococcygeal disorders, not elsewhere classified: Secondary | ICD-10-CM | POA: Insufficient documentation

## 2023-06-20 DIAGNOSIS — N393 Stress incontinence (female) (male): Secondary | ICD-10-CM

## 2023-06-20 DIAGNOSIS — Z3009 Encounter for other general counseling and advice on contraception: Secondary | ICD-10-CM

## 2023-06-20 MED ORDER — CAREX COCCYX CUSHION MISC: 1.0000 | 0 refills | Status: DC | PRN

## 2023-06-20 MED ORDER — NORETHINDRONE 0.35 MG PO TABS: 1.0000 | ORAL_TABLET | Freq: Every day | ORAL | 3 refills | Status: DC

## 2023-06-20 NOTE — Progress Notes (Signed)
    Post Partum Visit Note  Angela [name] is a 22 y.o. G20P1001 female who presents for a postpartum visit. She is 6 weeks postpartum following a normal spontaneous vaginal delivery.  I have fully reviewed the prenatal and intrapartum course. The delivery was at [redacted]w[redacted]d.  Anesthesia: none. Postpartum course has been uncomplicated. Baby is doing well. Baby is feeding by breast. Bleeding  has stopped . Bowel function is normal. Bladder function is normal. Patient is sexually active. Contraception method is  POPs . Postpartum depression screening: negative.   The pregnancy intention screening data noted above was reviewed. Potential methods of contraception were discussed. The patient elected to proceed with No data recorded.    Health Maintenance Due  Topic Date Due   COVID-19 Vaccine (1 - 2024-25 season) Never done    The following portions of the patient's history were reviewed and updated as appropriate: allergies, current medications, past medical history, past surgical history, and problem list.  Review of Systems Pertinent items are noted in HPI.  Objective:  BP 103/70   Pulse 82   Ht 5\' 6"  (1.676 m)   Wt 215 lb (97.5 kg)   Breastfeeding Yes   BMI 34.70 kg/m    General:  alert, cooperative, and appears stated age   Breasts:  normal  Lungs: clear to auscultation bilaterally  Heart:  regular rate and rhythm, S1, S2 normal, no murmur, click, rub or gallop  Abdomen: soft, non-tender; bowel sounds normal; no masses,  no organomegaly   Wound: Labial laceration well-healed  GU exam:  normal       Assessment:   Normal postpartum exam.  Sacral pain  Plan:   Essential components of care per ACOG recommendations:  1.  Mood and well being: Patient with negative depression screening today. Reviewed local resources for support.  - Patient tobacco use? No.   - hx of drug use?   Yes  2. Infant care and feeding:  -Patient currently breastmilk feeding? Yes   3. Sexuality,  contraception and birth spacing - Patient does not want a pregnancy in the next year.  Patient desired Oral Contraceptive today.   - Discussed birth spacing of 18 months  4. Sleep and fatigue -Encouraged family/partner/community support of 4 hrs of uninterrupted sleep to help with mood and fatigue  5. Physical Recovery  - Discussed patients delivery and complications. She describes her labor as good. - Patient had a  NSVD . Patient had a  labial  laceration. Perineal healing reviewed. Patient expressed understanding - Patient has urinary incontinence? Yes. Discussed role of pelvic floor PT. Offered PT and patient accepted. Patient was referred to pelvic floor PT.  - Patient is safe to resume physical and sexual activity  6.  Health Maintenance - HM due items addressed Yes - Last pap smear  Diagnosis  Date Value Ref Range Status  08/28/2020   Final   - Negative for intraepithelial lesion or malignancy (NILM)   Pap smear not done at today's visit.  -Breast Cancer screening indicated? No.   7. Chronic Disease/Pregnancy Condition follow up: Anemia  8. Sacral pain - referral to chiropractor. Coccyx cushion ordered. - PCP follow up  Glenetta Borg, CNM [facility]

## 2023-06-21 LAB — CBC
Hematocrit: 37.1 % (ref 34.0–46.6)
Hemoglobin: 12 g/dL (ref 11.1–15.9)
MCH: 28.2 pg (ref 26.6–33.0)
MCHC: 32.3 g/dL (ref 31.5–35.7)
MCV: 87 fL (ref 79–97)
Platelets: 335 10*3/uL (ref 150–450)
RBC: 4.25 x10E6/uL (ref 3.77–5.28)
RDW: 12.2 % (ref 11.7–15.4)
WBC: 12 10*3/uL — ABNORMAL HIGH (ref 3.4–10.8)

## 2023-06-27 ENCOUNTER — Encounter: Payer: Self-pay | Admitting: Obstetrics

## 2023-07-02 DIAGNOSIS — Z419 Encounter for procedure for purposes other than remedying health state, unspecified: Secondary | ICD-10-CM | POA: Diagnosis not present

## 2023-07-24 DIAGNOSIS — S34109S Unspecified injury to unspecified level of lumbar spinal cord, sequela: Secondary | ICD-10-CM | POA: Diagnosis not present

## 2023-07-24 DIAGNOSIS — M792 Neuralgia and neuritis, unspecified: Secondary | ICD-10-CM | POA: Diagnosis not present

## 2023-08-02 DIAGNOSIS — Z419 Encounter for procedure for purposes other than remedying health state, unspecified: Secondary | ICD-10-CM | POA: Diagnosis not present

## 2023-08-18 ENCOUNTER — Telehealth: Payer: Self-pay | Admitting: Physician Assistant

## 2023-08-18 NOTE — Telephone Encounter (Signed)
[facility] pharmacy is requesting refill escitalopram (LEXAPRO) 20 MG tablet Mg is different then what is in epic Please advise

## 2023-08-19 ENCOUNTER — Other Ambulatory Visit: Payer: Self-pay

## 2023-08-19 NOTE — Telephone Encounter (Signed)
Patient needs to be seen, this has not been prescribed by our practice

## 2023-08-26 NOTE — Telephone Encounter (Signed)
 Left voicemail for patient to call back to schedule appointment to be seen in office with new provider for further refills

## 2023-08-28 ENCOUNTER — Ambulatory Visit: Payer: Medicaid Other | Admitting: Physician Assistant

## 2023-08-28 ENCOUNTER — Ambulatory Visit (INDEPENDENT_AMBULATORY_CARE_PROVIDER_SITE_OTHER): Payer: Medicaid Other | Admitting: Physician Assistant

## 2023-08-28 VITALS — BP 108/74 | HR 98 | Resp 16 | Ht 66.0 in | Wt 235.0 lb

## 2023-08-28 DIAGNOSIS — F339 Major depressive disorder, recurrent, unspecified: Secondary | ICD-10-CM | POA: Diagnosis not present

## 2023-08-28 DIAGNOSIS — M6289 Other specified disorders of muscle: Secondary | ICD-10-CM

## 2023-08-28 DIAGNOSIS — Z6837 Body mass index (BMI) 37.0-37.9, adult: Secondary | ICD-10-CM | POA: Diagnosis not present

## 2023-08-28 DIAGNOSIS — N393 Stress incontinence (female) (male): Secondary | ICD-10-CM | POA: Diagnosis not present

## 2023-08-28 MED ORDER — ESCITALOPRAM OXALATE 10 MG PO TABS: 10.0000 mg | ORAL_TABLET | Freq: Every day | ORAL | 0 refills | Status: DC

## 2023-08-28 NOTE — Progress Notes (Unsigned)
 Established patient visit  Patient: Angela [name]   DOB: 2000-08-29   23 y.o. Female  MRN: [id number] Visit Date: 08/28/2023  Today's healthcare provider: Debera Lat, PA-C   Chief Complaint  Patient presents with  . Anxiety  . Depression   Subjective       Discussed the use of AI scribe software for clinical note transcription with the patient, who gave verbal consent to proceed.  History of Present Illness               08/28/2023    2:35 PM 04/16/2023   10:55 AM 04/09/2023   10:54 AM  Depression screen PHQ 2/9  Decreased Interest 1 0 0  Down, Depressed, Hopeless 1 0 0  PHQ - 2 Score 2 0 0  Altered sleeping 1    Tired, decreased energy 1    Change in appetite 2    Feeling bad or failure about yourself  0    Trouble concentrating 0    Moving slowly or fidgety/restless 0    Suicidal thoughts 0    PHQ-9 Score 6    Difficult doing work/chores Somewhat difficult        08/28/2023    2:35 PM 02/07/2022    3:24 PM  GAD 7 : Generalized Anxiety Score  Nervous, Anxious, on Edge 3   Control/stop worrying 2   Worry too much - different things 2   Trouble relaxing 0   Restless 0   Easily annoyed or irritable 2   Afraid - awful might happen    Total GAD 7 Score    Anxiety Difficulty Somewhat difficult      Information is confidential and restricted. Go to Review Flowsheets to unlock data.    Medications: Outpatient Medications Prior to Visit  Medication Sig  . baclofen (LIORESAL) 10 MG tablet 10 mg in AM; 20 mg in PM  . escitalopram (LEXAPRO) 10 MG tablet Take 5 mg by mouth daily.  Marland Kitchen gabapentin (NEURONTIN) 400 MG capsule TAKE 2 CAPSULES BY MOUTH 4 TIMES DAILY  . ibuprofen (ADVIL) 600 MG tablet Take 1 tablet (600 mg total) by mouth every 6 (six) hours.  . Misc. Devices (CAREX COCCYX CUSHION) MISC 1 Piece by Does not apply route as needed. Sacral pillow  . norethindrone (MICRONOR) 0.35 MG tablet Take 1 tablet (0.35 mg total) by mouth daily.  . prenatal vitamin  w/FE, FA (NATACHEW) 29-1 MG CHEW chewable tablet Chew 2 tablets by mouth daily at 12 noon.   No facility-administered medications prior to visit.    Review of Systems All negative Except see HPI   {Insert previous labs (optional):23779} {See past labs  Heme  Chem  Endocrine  Serology  Results Review (optional):1}   Objective    BP 108/74   Pulse 98   Resp 16   Ht 5\' 6"  (1.676 m)   Wt 235 lb (106.6 kg)   SpO2 99%   BMI 37.93 kg/m  {Insert last BP/Wt (optional):23777}{See vitals history (optional):1}   Physical Exam   No results found for any visits on 08/28/23.      Assessment and Plan             No orders of the defined types were placed in this encounter.   No follow-ups on file.   The patient was advised to call back or seek an in-person evaluation if the symptoms worsen or if the condition fails to improve as anticipated.  I discussed the assessment and treatment  plan with the patient. The patient was provided an opportunity to ask questions and all were answered. The patient agreed with the plan and demonstrated an understanding of the instructions.  I, Debera Lat, PA-C have reviewed all documentation for this visit. The documentation on 08/28/2023  for the exam, diagnosis, procedures, and orders are all accurate and complete.  Debera Lat, [facility], MMS [facility] [phone] (phone) [phone] (fax)  [facility]

## 2023-08-29 ENCOUNTER — Encounter: Payer: Self-pay | Admitting: Physician Assistant

## 2023-08-30 DIAGNOSIS — Z419 Encounter for procedure for purposes other than remedying health state, unspecified: Secondary | ICD-10-CM | POA: Diagnosis not present

## 2023-10-08 ENCOUNTER — Telehealth: Payer: Self-pay | Admitting: Family Medicine

## 2023-10-08 DIAGNOSIS — G839 Paralytic syndrome, unspecified: Secondary | ICD-10-CM

## 2023-10-08 DIAGNOSIS — G8220 Paraplegia, unspecified: Secondary | ICD-10-CM

## 2023-10-08 DIAGNOSIS — F339 Major depressive disorder, recurrent, unspecified: Secondary | ICD-10-CM

## 2023-10-08 DIAGNOSIS — S34109S Unspecified injury to unspecified level of lumbar spinal cord, sequela: Secondary | ICD-10-CM

## 2023-10-08 MED ORDER — BACLOFEN 10 MG PO TABS: ORAL_TABLET | ORAL | 3 refills | Status: AC

## 2023-10-08 MED ORDER — ESCITALOPRAM OXALATE 10 MG PO TABS: 10.0000 mg | ORAL_TABLET | Freq: Every day | ORAL | 0 refills | Status: DC

## 2023-10-08 NOTE — Telephone Encounter (Signed)
 [facility] pharmacy is requesting refill baclofen (LIORESAL) 10 MG tablet  & gabapentin (NEURONTIN) 400 MG capsule   Please advise

## 2023-10-11 ENCOUNTER — Other Ambulatory Visit: Payer: Self-pay | Admitting: Internal Medicine

## 2023-10-11 DIAGNOSIS — F32A Anxiety disorder, unspecified: Secondary | ICD-10-CM

## 2023-10-11 DIAGNOSIS — M62838 Other muscle spasm: Secondary | ICD-10-CM

## 2023-10-11 DIAGNOSIS — S34109S Unspecified injury to unspecified level of lumbar spinal cord, sequela: Secondary | ICD-10-CM

## 2023-10-11 DIAGNOSIS — G839 Paralytic syndrome, unspecified: Secondary | ICD-10-CM

## 2023-10-11 DIAGNOSIS — Z419 Encounter for procedure for purposes other than remedying health state, unspecified: Secondary | ICD-10-CM | POA: Diagnosis not present

## 2023-10-11 DIAGNOSIS — E559 Vitamin D deficiency, unspecified: Secondary | ICD-10-CM

## 2023-10-11 DIAGNOSIS — Z23 Encounter for immunization: Secondary | ICD-10-CM

## 2023-10-11 DIAGNOSIS — F339 Major depressive disorder, recurrent, unspecified: Secondary | ICD-10-CM

## 2023-10-11 DIAGNOSIS — M792 Neuralgia and neuritis, unspecified: Secondary | ICD-10-CM

## 2023-10-11 DIAGNOSIS — F419 Anxiety disorder, unspecified: Secondary | ICD-10-CM

## 2023-10-11 DIAGNOSIS — G8220 Paraplegia, unspecified: Secondary | ICD-10-CM

## 2023-10-11 DIAGNOSIS — Z1159 Encounter for screening for other viral diseases: Secondary | ICD-10-CM

## 2023-10-11 MED ORDER — GABAPENTIN 400 MG PO CAPS
800.0000 mg | ORAL_CAPSULE | Freq: Four times a day (QID) | ORAL | 0 refills | Status: DC
Start: 2023-10-11 — End: 2023-11-12

## 2023-10-16 DIAGNOSIS — G8220 Paraplegia, unspecified: Secondary | ICD-10-CM | POA: Diagnosis not present

## 2023-10-16 DIAGNOSIS — S31139A Puncture wound of abdominal wall without foreign body, unspecified quadrant without penetration into peritoneal cavity, initial encounter: Secondary | ICD-10-CM | POA: Diagnosis not present

## 2023-10-16 DIAGNOSIS — N319 Neuromuscular dysfunction of bladder, unspecified: Secondary | ICD-10-CM | POA: Diagnosis not present

## 2023-10-16 DIAGNOSIS — S34103S Unspecified injury to L3 level of lumbar spinal cord, sequela: Secondary | ICD-10-CM | POA: Diagnosis not present

## 2023-10-16 DIAGNOSIS — S34123D Incomplete lesion of L3 level of lumbar spinal cord, subsequent encounter: Secondary | ICD-10-CM | POA: Diagnosis not present

## 2023-11-10 DIAGNOSIS — Z419 Encounter for procedure for purposes other than remedying health state, unspecified: Secondary | ICD-10-CM | POA: Diagnosis not present

## 2023-11-12 ENCOUNTER — Ambulatory Visit: Admitting: Physician Assistant

## 2023-11-12 ENCOUNTER — Other Ambulatory Visit: Payer: Self-pay | Admitting: Physician Assistant

## 2023-11-12 DIAGNOSIS — G8220 Paraplegia, unspecified: Secondary | ICD-10-CM

## 2023-11-12 DIAGNOSIS — G839 Paralytic syndrome, unspecified: Secondary | ICD-10-CM

## 2023-11-12 DIAGNOSIS — M792 Neuralgia and neuritis, unspecified: Secondary | ICD-10-CM

## 2023-11-12 DIAGNOSIS — S34109S Unspecified injury to unspecified level of lumbar spinal cord, sequela: Secondary | ICD-10-CM

## 2023-11-12 DIAGNOSIS — M62838 Other muscle spasm: Secondary | ICD-10-CM

## 2023-11-12 NOTE — Telephone Encounter (Signed)
 Copied from CRM [id number]. Topic: Clinical - Medication Refill >> Nov 12, 2023 12:58 PM Carlatta H wrote: Medication: gabapentin  (NEURONTIN ) 400 MG capsule  Has the patient contacted their pharmacy? No (Agent: If no, request that the patient contact the pharmacy for the refill. If patient does not wish to contact the pharmacy document the reason why and proceed with request.) (Agent: If yes, when and what did the pharmacy advise?)  This is the patient's preferred pharmacy:  [facility] [address], [place] - [address] [place] [address] Phone: [phone] Fax: [phone]    Is this the correct pharmacy for this prescription? Yes If no, delete pharmacy and type the correct one.   Has the prescription been filled recently? Yes  Is the patient out of the medication? Yes  Has the patient been seen for an appointment in the last year OR does the patient have an upcoming appointment? Yes  Can we respond through MyChart? Yes  Agent: Please be advised that Rx refills may take up to 3 business days. We ask that you follow-up with your pharmacy.

## 2023-11-12 NOTE — Progress Notes (Deleted)
 Established patient visit  Patient: Angela [name]   DOB: 2001/01/20   23 y.o. Female  MRN: [id number] Visit Date: 11/12/2023  Today's healthcare provider: Blane Bunting, PA-C   No chief complaint on file.  Subjective       Discussed the use of AI scribe software for clinical note transcription with the patient, who gave verbal consent to proceed.  History of Present Illness        08/28/2023    2:35 PM 04/16/2023   10:55 AM 04/09/2023   10:54 AM  Depression screen PHQ 2/9  Decreased Interest 1 0 0  Down, Depressed, Hopeless 1 0 0  PHQ - 2 Score 2 0 0  Altered sleeping 1    Tired, decreased energy 1    Change in appetite 2    Feeling bad or failure about yourself  0    Trouble concentrating 0    Moving slowly or fidgety/restless 0    Suicidal thoughts 0    PHQ-9 Score 6    Difficult doing work/chores Somewhat difficult        08/28/2023    2:35 PM 02/07/2022    3:24 PM  GAD 7 : Generalized Anxiety Score  Nervous, Anxious, on Edge 3   Control/stop worrying 2   Worry too much - different things 2   Trouble relaxing 0   Restless 0   Easily annoyed or irritable 2   Afraid - awful might happen    Total GAD 7 Score    Anxiety Difficulty Somewhat difficult      Information is confidential and restricted. Go to Review Flowsheets to unlock data.    Medications: Outpatient Medications Prior to Visit  Medication Sig   baclofen  (LIORESAL ) 10 MG tablet 10 mg in AM; 20 mg in PM   escitalopram  (LEXAPRO ) 10 MG tablet Take 1 tablet (10 mg total) by mouth daily. Take 5 mg by mouth daily.   gabapentin  (NEURONTIN ) 400 MG capsule Take 2 capsules (800 mg total) by mouth 4 (four) times daily.   ibuprofen  (ADVIL ) 600 MG tablet Take 1 tablet (600 mg total) by mouth every 6 (six) hours.   Misc. Devices (CAREX COCCYX CUSHION) MISC 1 Piece by Does not apply route as needed. Sacral pillow   norethindrone  (MICRONOR ) 0.35 MG tablet Take 1 tablet (0.35 mg total) by mouth daily.    prenatal vitamin w/FE, FA (NATACHEW) 29-1 MG CHEW chewable tablet Chew 2 tablets by mouth daily at 12 noon.   No facility-administered medications prior to visit.    Review of Systems  All other systems reviewed and are negative.  All negative Except see HPI   {Insert previous labs (optional):23779} {See past labs  Heme  Chem  Endocrine  Serology  Results Review (optional):1}   Objective    There were no vitals taken for this visit. {Insert last BP/Wt (optional):23777}{See vitals history (optional):1}   Physical Exam Constitutional:      General: She is not in acute distress.    Appearance: Normal appearance.  HENT:     Head: Normocephalic.  Pulmonary:     Effort: Pulmonary effort is normal. No respiratory distress.  Skin:    Findings: Lesion present.  Neurological:     Mental Status: She is alert and oriented to person, place, and time. Mental status is at baseline.      No results found for any visits on 11/12/23.      Assessment and Plan Assessment & Plan  No orders of the defined types were placed in this encounter.   No follow-ups on file.   The patient was advised to call back or seek an in-person evaluation if the symptoms worsen or if the condition fails to improve as anticipated.  I discussed the assessment and treatment plan with the patient. The patient was provided an opportunity to ask questions and all were answered. The patient agreed with the plan and demonstrated an understanding of the instructions.  I, Kyston Gonce, PA-C have reviewed all documentation for this visit. The documentation on 11/12/2023  for the exam, diagnosis, procedures, and orders are all accurate and complete.  Blane Bunting, [facility], MMS [facility] [phone] (phone) [phone] (fax)  [facility]

## 2023-11-13 MED ORDER — GABAPENTIN 400 MG PO CAPS: 800.0000 mg | ORAL_CAPSULE | Freq: Four times a day (QID) | ORAL | 2 refills | Status: DC

## 2023-11-13 NOTE — Telephone Encounter (Signed)
 Requested Prescriptions  Pending Prescriptions Disp Refills   gabapentin  (NEURONTIN ) 400 MG capsule 240 capsule 2    Sig: Take 2 capsules (800 mg total) by mouth 4 (four) times daily.     Neurology: Anticonvulsants - gabapentin  Failed - 11/13/2023  3:54 PM      Failed - Cr in normal range and within 360 days    Creat  Date Value Ref Range Status  02/17/2018 0.56 0.50 - 1.00 mg/dL Final   Creatinine, Ser  Date Value Ref Range Status  07/04/2022 0.63 0.57 - 1.00 mg/dL Final         Failed - Valid encounter within last 12 months    Recent Outpatient Visits           2 months ago Depression, recurrent [facility])   [place] [facility] [place], Carlos, PA-C       Future Appointments             In 1 week [place], Janna, PA-C [facility]            Passed - Completed PHQ-2 or PHQ-9 in the last 360 days

## 2023-11-17 DIAGNOSIS — S34103S Unspecified injury to L3 level of lumbar spinal cord, sequela: Secondary | ICD-10-CM | POA: Diagnosis not present

## 2023-11-17 DIAGNOSIS — S31139A Puncture wound of abdominal wall without foreign body, unspecified quadrant without penetration into peritoneal cavity, initial encounter: Secondary | ICD-10-CM | POA: Diagnosis not present

## 2023-11-17 DIAGNOSIS — G8220 Paraplegia, unspecified: Secondary | ICD-10-CM | POA: Diagnosis not present

## 2023-11-17 DIAGNOSIS — N319 Neuromuscular dysfunction of bladder, unspecified: Secondary | ICD-10-CM | POA: Diagnosis not present

## 2023-11-17 DIAGNOSIS — S34123D Incomplete lesion of L3 level of lumbar spinal cord, subsequent encounter: Secondary | ICD-10-CM | POA: Diagnosis not present

## 2023-11-25 ENCOUNTER — Telehealth: Payer: Medicaid Other | Admitting: Physician Assistant

## 2023-11-25 ENCOUNTER — Telehealth: Payer: Self-pay | Admitting: Physician Assistant

## 2023-11-25 DIAGNOSIS — F419 Anxiety disorder, unspecified: Secondary | ICD-10-CM

## 2023-11-25 DIAGNOSIS — F32A Depression, unspecified: Secondary | ICD-10-CM

## 2023-11-25 DIAGNOSIS — F339 Major depressive disorder, recurrent, unspecified: Secondary | ICD-10-CM

## 2023-11-25 DIAGNOSIS — Z789 Other specified health status: Secondary | ICD-10-CM | POA: Diagnosis not present

## 2023-11-25 NOTE — Telephone Encounter (Unsigned)
 Copied from CRM [id number]. Topic: Clinical - Medication Refill >> Nov 25, 2023  4:00 PM [place] wrote: Medication: escitalopram  (LEXAPRO ) 10 MG tablet  Has the patient contacted their pharmacy? No (Agent: If no, request that the patient contact the pharmacy for the refill. If patient does not wish to contact the pharmacy document the reason why and proceed with request.) (Agent: If yes, when and what did the pharmacy advise?)  This is the patient's preferred pharmacy:  [facility] [address], [place] - [address] [place] [address] Phone: [phone] Fax: [phone]    Is this the correct pharmacy for this prescription? Yes If no, delete pharmacy and type the correct one.   Has the prescription been filled recently? Yes  Is the patient out of the medication? Yes  Has the patient been seen for an appointment in the last year OR does the patient have an upcoming appointment? Yes  Can we respond through MyChart? Yes  Agent: Please be advised that Rx refills may take up to 3 business days. We ask that you follow-up with your pharmacy.

## 2023-11-25 NOTE — Progress Notes (Signed)
 MyChart Video Visit  Virtual Visit via Video Note   This format is felt to be most appropriate for this patient at this time. Physical exam was limited by quality of the video and audio technology used for the visit.   Provider location: Virtual Visit Location Provider: Office clinic Patient Location: Home  I discussed the limitations of evaluation and management by telemedicine and the availability of in person appointments. The patient expressed understanding and agreed to proceed.  Patient: Angela [name]   DOB: 06/27/2001   23 y.o. Female  MRN: [id number] Visit Date: 11/25/2023  Today's healthcare provider: Blane Bunting, PA-C   No chief complaint on file.  Subjective     Discussed the use of AI scribe software for clinical note transcription with the patient, who gave verbal consent to proceed.  History of Present Illness Angela [name] is a 23 year old female with depression who presents with anxiety management concerns while breastfeeding.  Her depression is well-managed, but she experiences significant anxiety. She takes Lexapro  5 mg daily, half of the prescribed dose, without side effects. She is concerned about increasing the dose due to potential effects on her breastfeeding child, such as irritability and poor feeding. She plans to continue breastfeeding for six more months. Her son sleeps through the night, but she wakes every four hours to feed him. She has not engaged with a pelvic floor physical therapist due to childcare and scheduling challenges.     Medications: Outpatient Medications Prior to Visit  Medication Sig   baclofen  (LIORESAL ) 10 MG tablet 10 mg in AM; 20 mg in PM   escitalopram  (LEXAPRO ) 10 MG tablet Take 1 tablet (10 mg total) by mouth daily. Take 5 mg by mouth daily.   gabapentin  (NEURONTIN ) 400 MG capsule Take 2 capsules (800 mg total) by mouth 4 (four) times daily.   ibuprofen  (ADVIL ) 600 MG tablet Take 1 tablet (600 mg total) by mouth every 6  (six) hours.   Misc. Devices (CAREX COCCYX CUSHION) MISC 1 Piece by Does not apply route as needed. Sacral pillow   norethindrone  (MICRONOR ) 0.35 MG tablet Take 1 tablet (0.35 mg total) by mouth daily.   prenatal vitamin w/FE, FA (NATACHEW) 29-1 MG CHEW chewable tablet Chew 2 tablets by mouth daily at 12 noon.   No facility-administered medications prior to visit.    Review of Systems All negative  Except see HPI   {Insert previous labs (optional):23779} {See past labs  Heme  Chem  Endocrine  Serology  Results Review (optional):1}   Objective    There were no vitals taken for this visit.  {Insert last BP/Wt (optional):23777}{See vitals history (optional):1}     Physical Exam  Constitutional:      General: She is not in acute distress.    Appearance: Normal appearance.  HENT:     Head: Normocephalic.  Pulmonary:     Effort: Pulmonary effort is normal. No respiratory distress.  Neurological:     Mental Status: She is alert and oriented to person, place, and time. Mental status is at baseline.        Assessment & Plan Anxiety Chronic Anxiety symptoms are more problematic than depression. She is hesitant to increase Lexapro  due to breastfeeding concerns. Open to non-pharmacological interventions. - Encouraged relaxation techniques: deep breathing, qigong, tai chi, yoga, pilates, meditation. - Encouraged socialization and physical activity. - Consider referral to therapist if symptoms persist or worsen. Will follow-up  Depression Chronic Depression well-managed with Lexapro  5 mg.  No side effects reported. Prefers to maintain current dose until breastfeeding is completed. - Continue Lexapro  5 mg daily. - Reassess medication dosage after breastfeeding in approximately six months. - Schedule blood work to assess current health status.  Will follow-up  No follow-ups on file.     I discussed the assessment and treatment plan with the patient. The patient was  provided an opportunity to ask questions and all were answered. The patient agreed with the plan and demonstrated an understanding of the instructions.   The patient was advised to call back or seek an in-person evaluation if the symptoms worsen or if the condition fails to improve as anticipated.  I, Zarif Rathje, PA-C have reviewed all documentation for this visit. The documentation on 11/25/2023  for the exam, diagnosis, procedures, and orders are all accurate and complete.  Blane Bunting, [facility], MMS [facility] [phone] (phone) [phone] (fax)  [facility]

## 2023-11-26 ENCOUNTER — Encounter: Payer: Self-pay | Admitting: Physician Assistant

## 2023-11-26 DIAGNOSIS — F419 Anxiety disorder, unspecified: Secondary | ICD-10-CM | POA: Insufficient documentation

## 2023-11-26 DIAGNOSIS — Z789 Other specified health status: Secondary | ICD-10-CM | POA: Insufficient documentation

## 2023-11-28 NOTE — Telephone Encounter (Signed)
 Too soon for refill, refilled 10/08/23 for 90 days.  Requested Prescriptions  Pending Prescriptions Disp Refills   escitalopram  (LEXAPRO ) 10 MG tablet 90 tablet 0    Sig: Take 1 tablet (10 mg total) by mouth daily. Take 5 mg by mouth daily.     Psychiatry:  Antidepressants - SSRI Passed - 11/28/2023  9:57 AM      Passed - Completed PHQ-2 or PHQ-9 in the last 360 days      Passed - Valid encounter within last 6 months    Recent Outpatient Visits           3 days ago Depression, recurrent [facility])   [place] [facility] [place], [place], PA-C   3 months ago Depression, recurrent [facility])   [place] [facility] [place], Janna, PA-C

## 2023-12-11 DIAGNOSIS — Z419 Encounter for procedure for purposes other than remedying health state, unspecified: Secondary | ICD-10-CM | POA: Diagnosis not present

## 2024-01-10 DIAGNOSIS — Z419 Encounter for procedure for purposes other than remedying health state, unspecified: Secondary | ICD-10-CM | POA: Diagnosis not present
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
## 2024-01-27 ENCOUNTER — Ambulatory Visit: Admitting: Certified Nurse Midwife

## 2024-01-27 DIAGNOSIS — Z124 Encounter for screening for malignant neoplasm of cervix: Secondary | ICD-10-CM

## 2024-02-10 DIAGNOSIS — Z419 Encounter for procedure for purposes other than remedying health state, unspecified: Secondary | ICD-10-CM | POA: Diagnosis not present

## 2024-02-25 ENCOUNTER — Ambulatory Visit (INDEPENDENT_AMBULATORY_CARE_PROVIDER_SITE_OTHER): Admitting: Family Medicine

## 2024-02-25 ENCOUNTER — Encounter: Payer: Self-pay | Admitting: Family Medicine

## 2024-02-25 VITALS — BP 118/82 | HR 105 | Temp 98.8°F | Ht 66.0 in | Wt 240.0 lb

## 2024-02-25 DIAGNOSIS — J029 Acute pharyngitis, unspecified: Secondary | ICD-10-CM | POA: Diagnosis not present

## 2024-02-25 DIAGNOSIS — J069 Acute upper respiratory infection, unspecified: Secondary | ICD-10-CM | POA: Diagnosis not present

## 2024-02-25 LAB — POCT RAPID STREP A (OFFICE): Rapid Strep A Screen: NEGATIVE

## 2024-02-25 LAB — POC COVID19/FLU A&B COMBO
Covid Antigen, POC: NEGATIVE
Influenza A Antigen, POC: NEGATIVE
Influenza B Antigen, POC: NEGATIVE

## 2024-02-25 MED ORDER — FLUTICASONE PROPIONATE 50 MCG/ACT NA SUSP: 2.0000 | Freq: Every day | NASAL | 0 refills | Status: AC

## 2024-02-25 MED ORDER — ALBUTEROL SULFATE HFA 108 (90 BASE) MCG/ACT IN AERS: 2.0000 | INHALATION_SPRAY | Freq: Four times a day (QID) | RESPIRATORY_TRACT | 0 refills | Status: AC | PRN

## 2024-02-25 MED ORDER — IBUPROFEN 400 MG PO TABS: 400.0000 mg | ORAL_TABLET | Freq: Four times a day (QID) | ORAL | 0 refills | Status: AC | PRN

## 2024-02-25 MED ORDER — GUAIFENESIN-DM 100-10 MG/5ML PO SYRP: 10.0000 mL | ORAL_SOLUTION | ORAL | 1 refills | Status: DC | PRN

## 2024-02-25 NOTE — Patient Instructions (Signed)
 Your symptoms today are most likely being caused by a virus and should steadily improve in time it can take up to 7 to 10 days before you truly start to see a turnaround however things will get better   COVID, flu and strep testing are negative.  You can take Tylenol  and/or Ibuprofen  as needed for fever reduction and pain relief.   For cough: honey 1/2 to 1 teaspoon (you can dilute the honey in water or another fluid).  You can also use guaifenesin  and dextromethorphan for cough.   - You can use a humidifier for chest congestion and cough.  If you don't have a humidifier, you can sit in the bathroom with the hot shower running.      For sore throat: try warm salt water gargles, cepacol lozenges, throat spray, warm tea or water with lemon/honey, popsicles or ice, or OTC cold relief medicine for throat discomfort.   For congestion:   - take a daily anti-histamine like Zyrtec (cetirizine), Claritin  (loratadine ), or Allegra (fexofenadine),   - You can also use Flonase  1-2 sprays in each nostril daily.   It is important to stay hydrated: drink plenty of fluids (water, gatorade/powerade/pedialyte, juices, or teas) to keep your throat moisturized and help further relieve irritation/discomfort.  These should be sugar-free if you are diabetic.

## 2024-02-25 NOTE — Progress Notes (Signed)
 Established patient visit   Patient: Angela [name]   DOB: 09-29-2000   22 y.o. Female  MRN: [id number] Visit Date: 02/25/2024  Today's healthcare provider: LAURAINE LOISE BUOY, DO   Chief Complaint  Patient presents with   Cough    Coughing, congestion, sore throat and a fever last night this has been going on since yesterday    Subjective    HPI Angela [name] is a 23 year old female who presents with symptoms of a viral illness after returning from a trip to [facility].  Symptoms began after her son (25 months old and breast-fed) showed signs of illness, starting the morning after returning from [facility]. She experienced a low-grade fever of 101.43F last night.  She describes a severe sore throat that makes swallowing difficult and a cough that feels like 'coughing up glass.' She also experiences shortness of breath, body aches, cold shakes, and shivers. She has been using Nyquil and chloroceptic cough drops to manage her symptoms.  She has been in contact [place] people [place] the beach, including activities like putt-putt and swimming, but has not been in contact with anyone else known to be sick aside from her son.  She is currently breastfeeding her nine-month-old son and is concerned about the safety of medications. She has been using off-brand acetaminophen  from [employer], Nyquil in liquid form, and DayQuil in pill form.  She reports a lack of appetite today but was able to eat last night. No nausea, but her throat is sore and she is coughing up phlegm.  She is experiencing sinus congestion and has not historically taken allergy medications.  She has difficulty sleeping due to her symptoms, particularly the cough, which is worse at night.       Medications: Outpatient Medications Prior to Visit  Medication Sig   baclofen  (LIORESAL ) 10 MG tablet 10 mg in AM; 20 mg in PM   escitalopram  (LEXAPRO ) 10 MG tablet Take 1 tablet (10 mg total) by mouth daily. Take  5 mg by mouth daily.   gabapentin  (NEURONTIN ) 400 MG capsule Take 2 capsules (800 mg total) by mouth 4 (four) times daily.   Misc. Devices (CAREX COCCYX CUSHION) MISC 1 Piece by Does not apply route as needed. Sacral pillow (Patient not taking: Reported on 02/25/2024)   norethindrone  (MICRONOR ) 0.35 MG tablet Take 1 tablet (0.35 mg total) by mouth daily.   prenatal vitamin w/FE, FA (NATACHEW) 29-1 MG CHEW chewable tablet Chew 2 tablets by mouth daily at 12 noon.   [DISCONTINUED] ibuprofen  (ADVIL ) 600 MG tablet Take 1 tablet (600 mg total) by mouth every 6 (six) hours. (Patient not taking: Reported on 02/25/2024)   No facility-administered medications prior to visit.        Objective    BP 118/82 (BP Location: Left Arm, Patient Position: Sitting, Cuff Size: Normal)   Pulse (!) 105   Temp 98.8 F (37.1 C) (Oral)   Ht 5' 6 (1.676 m)   Wt 240 lb (108.9 kg)   SpO2 96%   BMI 38.74 kg/m     Physical Exam Vitals reviewed.  Constitutional:      General: She is not in acute distress.    Appearance: Normal appearance. She is well-developed. She is not diaphoretic.  HENT:     Head: Normocephalic and atraumatic.     Right Ear: Tympanic membrane, ear canal and external ear normal.     Left Ear: Tympanic membrane, ear canal and  external ear normal.     Nose: Nose normal.     Mouth/Throat:     Mouth: Mucous membranes are moist.     Pharynx: Oropharynx is clear. No oropharyngeal exudate.  Eyes:     General: No scleral icterus.    Conjunctiva/sclera: Conjunctivae normal.     Pupils: Pupils are equal, round, and reactive to light.  Cardiovascular:     Rate and Rhythm: Normal rate and regular rhythm.     Pulses: Normal pulses.     Heart sounds: Normal heart sounds. No murmur heard. Pulmonary:     Effort: Pulmonary effort is normal. No respiratory distress.     Breath sounds: Normal breath sounds. No wheezing or rales.  Musculoskeletal:     Cervical back: Neck supple.     Right lower  leg: No edema.     Left lower leg: No edema.  Lymphadenopathy:     Cervical: No cervical adenopathy.  Skin:    General: Skin is warm and dry.     Findings: No rash.  Neurological:     Mental Status: She is alert.      Results for orders placed or performed in visit on 02/25/24  POCT rapid strep A  Result Value Ref Range   Rapid Strep A Screen Negative Negative  POC Covid19/Flu A&B Antigen  Result Value Ref Range   Influenza A Antigen, POC Negative Negative   Influenza B Antigen, POC Negative Negative   Covid Antigen, POC Negative Negative    Assessment & Plan    Sore throat -     POCT rapid strep A -     POC Covid19/Flu A&B Antigen -     Ibuprofen ; Take 1 tablet (400 mg total) by mouth every 6 (six) hours as needed.  Dispense: 60 tablet; Refill: 0  Viral upper respiratory tract infection -     POCT rapid strep A -     POC Covid19/Flu A&B Antigen -     guaiFENesin -DM; Take 10 mLs by mouth every 4 (four) hours as needed for cough.  Dispense: 236 mL; Refill: 1 -     Albuterol  Sulfate HFA; Inhale 2 puffs into the lungs every 6 (six) hours as needed for wheezing or shortness of breath.  Dispense: 8 g; Refill: 0 -     Fluticasone  Propionate; Place 2 sprays into both nostrils daily.  Dispense: 16 g; Refill: 0 -     Ibuprofen ; Take 1 tablet (400 mg total) by mouth every 6 (six) hours as needed.  Dispense: 60 tablet; Refill: 0     Acute viral upper respiratory infection; sore throat Acute viral upper respiratory infection likely due to recent travel. Symptoms suggest viral etiology; antibiotics not indicated. Supportive care recommended. - Prescribed guaifenesin  with dextromethorphan for cough in place of NyQuil/DayQuil. - Prescribed albuterol  inhaler for severe cough. - Recommended acetaminophen  and ibuprofen  for fever and body aches. - Advised rest, fluids, and humidifier use. - Suggested honey with hot liquids for throat. - Recommended loratadine , fexofenadine, or cetirizine  for sinus congestion. - Prescribed fluticasone  nasal spray for nasal congestion. - Advised against pseudoephedrine  due to breastfeeding.  All other medications checked and confirmed that they are considered reasonably safe with breast-feeding. - Instructed to monitor symptoms and consider antibiotics if symptoms persist or worsen beyond one week.    Return if symptoms worsen or fail to improve.      I discussed the assessment and treatment plan with the patient  The patient was provided  an opportunity to ask questions and all were answered. The patient agreed with the plan and demonstrated an understanding of the instructions.   The patient was advised to call back or seek an in-person evaluation if the symptoms worsen or if the condition fails to improve as anticipated.    LAURAINE LOISE BUOY, DO  [facility] [phone] (phone) [phone] (fax)  [facility]

## 2024-03-02 ENCOUNTER — Other Ambulatory Visit (HOSPITAL_COMMUNITY): Payer: Self-pay

## 2024-03-12 ENCOUNTER — Other Ambulatory Visit: Payer: Self-pay | Admitting: Physician Assistant

## 2024-03-12 DIAGNOSIS — G8220 Paraplegia, unspecified: Secondary | ICD-10-CM

## 2024-03-12 DIAGNOSIS — S34109S Unspecified injury to unspecified level of lumbar spinal cord, sequela: Secondary | ICD-10-CM

## 2024-03-12 DIAGNOSIS — Z419 Encounter for procedure for purposes other than remedying health state, unspecified: Secondary | ICD-10-CM | POA: Diagnosis not present

## 2024-03-12 DIAGNOSIS — M792 Neuralgia and neuritis, unspecified: Secondary | ICD-10-CM

## 2024-03-12 DIAGNOSIS — G839 Paralytic syndrome, unspecified: Secondary | ICD-10-CM

## 2024-03-12 DIAGNOSIS — M62838 Other muscle spasm: Secondary | ICD-10-CM

## 2024-03-29 ENCOUNTER — Telehealth (INDEPENDENT_AMBULATORY_CARE_PROVIDER_SITE_OTHER): Admitting: Physician Assistant

## 2024-03-29 DIAGNOSIS — Z833 Family history of diabetes mellitus: Secondary | ICD-10-CM | POA: Diagnosis not present

## 2024-03-29 DIAGNOSIS — G8220 Paraplegia, unspecified: Secondary | ICD-10-CM

## 2024-03-29 DIAGNOSIS — M5440 Lumbago with sciatica, unspecified side: Secondary | ICD-10-CM | POA: Diagnosis not present

## 2024-03-29 DIAGNOSIS — F339 Major depressive disorder, recurrent, unspecified: Secondary | ICD-10-CM | POA: Diagnosis not present

## 2024-03-29 DIAGNOSIS — G629 Polyneuropathy, unspecified: Secondary | ICD-10-CM | POA: Diagnosis not present

## 2024-03-29 DIAGNOSIS — Z881 Allergy status to other antibiotic agents status: Secondary | ICD-10-CM | POA: Diagnosis not present

## 2024-03-29 DIAGNOSIS — M62838 Other muscle spasm: Secondary | ICD-10-CM | POA: Diagnosis not present

## 2024-03-29 DIAGNOSIS — Z793 Long term (current) use of hormonal contraceptives: Secondary | ICD-10-CM | POA: Diagnosis not present

## 2024-03-29 DIAGNOSIS — Z789 Other specified health status: Secondary | ICD-10-CM | POA: Diagnosis not present

## 2024-03-29 DIAGNOSIS — F411 Generalized anxiety disorder: Secondary | ICD-10-CM | POA: Diagnosis not present

## 2024-03-29 DIAGNOSIS — E669 Obesity, unspecified: Secondary | ICD-10-CM | POA: Diagnosis not present

## 2024-03-29 DIAGNOSIS — F419 Anxiety disorder, unspecified: Secondary | ICD-10-CM | POA: Diagnosis not present

## 2024-03-29 DIAGNOSIS — F4310 Post-traumatic stress disorder, unspecified: Secondary | ICD-10-CM | POA: Diagnosis not present

## 2024-03-29 DIAGNOSIS — F325 Major depressive disorder, single episode, in full remission: Secondary | ICD-10-CM | POA: Diagnosis not present

## 2024-03-29 MED ORDER — ESCITALOPRAM OXALATE 10 MG PO TABS: 10.0000 mg | ORAL_TABLET | Freq: Every day | ORAL | 0 refills | Status: AC

## 2024-03-29 NOTE — Progress Notes (Unsigned)
 MyChart Video Visit  Virtual Visit via Video Note   This format is felt to be most appropriate for this patient at this time. Physical exam was limited by quality of the video and audio technology used for the visit.   Provider location: Virtual Visit Location Provider: Office/Clinic Patient Location: Home  I discussed the limitations of evaluation and management by telemedicine and the availability of in person appointments. The patient expressed understanding and agreed to proceed.  Patient: Angela [name]   DOB: 2001-01-27   23 y.o. Female  MRN: [id number] Visit Date: 03/29/2024  Today's healthcare provider: Jolynn Spencer, PA-C   Chief Complaint  Patient presents with   Medical Management of Chronic Issues    Follow-up Depression   Subjective    HPI HPI     Medical Management of Chronic Issues    Additional comments: Follow-up Depression      Last edited by Rosas, Joseline E, CMA on 03/29/2024 10:24 AM.      Discussed the use of AI scribe software for clinical note transcription with the patient, who gave verbal consent to proceed.  History of Present Illness Angela [name] is a 23 year old female who presents for a medication refill for depression and anxiety.  She takes Lexapro  5 mg daily, which has significantly improved her depressive symptoms. She denies anhedonia and feelings of depression or hopelessness over the past two weeks. However, she experiences anxiety symptoms, feeling nervous, anxious, or on edge nearly every day, and excessive worrying and irritability half the days.  She uses gabapentin  for severe nerve damage and muscle spasms due to a spinal cord injury, which has left her in a wheelchair. She has been on gabapentin  for about ten years. Her recent refill was reduced from six months to one or two months, causing inconvenience.  She is breastfeeding her ten-month-old child and is transitioning away from breastfeeding, nursing in the morning and at  night, with bottle feeding during the day.     Medications: Outpatient Medications Prior to Visit  Medication Sig   albuterol  (VENTOLIN  HFA) 108 (90 Base) MCG/ACT inhaler Inhale 2 puffs into the lungs every 6 (six) hours as needed for wheezing or shortness of breath.   baclofen  (LIORESAL ) 10 MG tablet 10 mg in AM; 20 mg in PM   escitalopram  (LEXAPRO ) 10 MG tablet Take 1 tablet (10 mg total) by mouth daily. Take 5 mg by mouth daily.   fluticasone  (FLONASE ) 50 MCG/ACT nasal spray Place 2 sprays into both nostrils daily.   gabapentin  (NEURONTIN ) 400 MG capsule TAKE 2 CAPSULES BY MOUTH 4 TIMES DAILY   ibuprofen  (ADVIL ) 400 MG tablet Take 1 tablet (400 mg total) by mouth every 6 (six) hours as needed.   norethindrone  (MICRONOR ) 0.35 MG tablet Take 1 tablet (0.35 mg total) by mouth daily.   prenatal vitamin w/FE, FA (NATACHEW) 29-1 MG CHEW chewable tablet Chew 2 tablets by mouth daily at 12 noon.   guaiFENesin -dextromethorphan (ROBITUSSIN DM) 100-10 MG/5ML syrup Take 10 mLs by mouth every 4 (four) hours as needed for cough.   Misc. Devices (CAREX COCCYX CUSHION) MISC 1 Piece by Does not apply route as needed. Sacral pillow (Patient not taking: Reported on 02/25/2024)   No facility-administered medications prior to visit.    Review of Systems All negative  Except see HPI   {Insert previous labs (optional):23779} {See past labs  Heme  Chem  Endocrine  Serology  Results Review (optional):1}   Objective    LMP  03/09/2024   {Insert last BP/Wt (optional):23777}{See vitals history (optional):1}     Physical Exam  Constitutional:      General: She is not in acute distress.    Appearance: Normal appearance.  HENT:     Head: Normocephalic.  Pulmonary:     Effort: Pulmonary effort is normal. No respiratory distress.  Neurological:     Mental Status: She is alert and oriented to person, place, and time. Mental status is at baseline.       Assessment & Plan Chronic neuropathic  pain and muscle spasms due to spinal cord injury in paraplegia Chronic neuropathic pain and muscle spasms managed with gabapentin . Under neurologist care for medication management. - Continue gabapentin  as prescribed until next neurology appointment. - Coordinate with neurologist for medication changes. - Ensure timely refill requests, allowing at least ten days for processing, especially around holidays.  Anxiety disorder and major depressive disorder Anxiety and depression well-controlled. Escitalopram  5 mg effective, minimizing exposure through breastfeeding. - Prescribe escitalopram  10 mg with instructions to continue taking half (5 mg) daily. - Schedule follow-up appointment in 5-6 months for reassessment and potential lab work.  Will follow-up   Depression, recurrent (Primary)  - escitalopram  (LEXAPRO ) 10 MG tablet; Take 1 tablet (10 mg total) by mouth daily. Take 5 mg by mouth daily.  Dispense: 90 tablet; Refill: 0  Anxiety   Paraplegia (HCC)  Breastfeeding (infant) Her son is 10 month, still brestfeeding Will follow-up   No follow-ups on file.     I discussed the assessment and treatment plan with the patient. The patient was provided an opportunity to ask questions and all were answered. The patient agreed with the plan and demonstrated an understanding of the instructions.   The patient was advised to call back or seek an in-person evaluation if the symptoms worsen or if the condition fails to improve as anticipated.  I, Asberry Lascola, PA-C have reviewed all documentation for this visit. The documentation on 03/29/2024  for the exam, diagnosis, procedures, and orders are all accurate and complete.  Jolynn Spencer, [facility], MMS [facility] [phone] (phone) [phone] (fax)  [facility]

## 2024-04-01 ENCOUNTER — Telehealth: Payer: Self-pay

## 2024-04-01 NOTE — Telephone Encounter (Unsigned)
 Copied from CRM [id number]. Topic: Clinical - Prescription Issue >> Apr 01, 2024  9:55 AM Amy B wrote: Reason for CRM: [facility] pharmacy requests clarification of Rx Lexapro .  Directions state:  Take 1 tablet (10 mg total) by mouth daily. Take 5 mg by mouth daily.  They need to know whether they need to dispense 5 mg or 10 mg.  Please call [phone]

## 2024-04-02 NOTE — Telephone Encounter (Signed)
 Spoke with pharmacist, stated someone returned the fax information with correct dispense dosage on medication.

## 2024-04-23 ENCOUNTER — Other Ambulatory Visit: Payer: Self-pay | Admitting: Obstetrics

## 2024-04-23 ENCOUNTER — Other Ambulatory Visit: Payer: Self-pay | Admitting: Physician Assistant

## 2024-04-23 DIAGNOSIS — G8220 Paraplegia, unspecified: Secondary | ICD-10-CM

## 2024-04-23 DIAGNOSIS — S34109S Unspecified injury to unspecified level of lumbar spinal cord, sequela: Secondary | ICD-10-CM

## 2024-04-23 DIAGNOSIS — M62838 Other muscle spasm: Secondary | ICD-10-CM

## 2024-04-23 DIAGNOSIS — G839 Paralytic syndrome, unspecified: Secondary | ICD-10-CM

## 2024-04-23 DIAGNOSIS — M792 Neuralgia and neuritis, unspecified: Secondary | ICD-10-CM

## 2024-04-26 ENCOUNTER — Other Ambulatory Visit: Payer: Self-pay | Admitting: Obstetrics

## 2024-04-26 ENCOUNTER — Other Ambulatory Visit: Payer: Self-pay

## 2024-04-26 ENCOUNTER — Telehealth: Payer: Self-pay

## 2024-04-26 DIAGNOSIS — Z3009 Encounter for other general counseling and advice on contraception: Secondary | ICD-10-CM

## 2024-04-26 MED ORDER — NORETHINDRONE 0.35 MG PO TABS: 1.0000 | ORAL_TABLET | Freq: Every day | ORAL | 0 refills | Status: DC

## 2024-04-26 NOTE — Telephone Encounter (Signed)
 Angela [name] called triage stating she needed her birth control refilled her annual is scheduled in December with Melissa Swanson.

## 2024-04-26 NOTE — Telephone Encounter (Signed)
 Requested Prescriptions  Pending Prescriptions Disp Refills   gabapentin  (NEURONTIN ) 400 MG capsule [Pharmacy Med Name: Gabapentin  400 MG Oral Capsule] 240 capsule 2    Sig: TAKE 2 CAPSULES BY MOUTH 4 TIMES DAILY     Neurology: Anticonvulsants - gabapentin  Failed - 04/26/2024 11:23 AM      Failed - Cr in normal range and within 360 days    Creat  Date Value Ref Range Status  02/17/2018 0.56 0.50 - 1.00 mg/dL Final   Creatinine, Ser  Date Value Ref Range Status  07/04/2022 0.63 0.57 - 1.00 mg/dL Final         Passed - Completed PHQ-2 or PHQ-9 in the last 360 days      Passed - Valid encounter within last 12 months    Recent Outpatient Visits           4 weeks ago Depression, recurrent   [facility] [place], [place], PA-C   2 months ago Sore throat   [facility] [place], Letoya Stallone, DO   5 months ago Depression, recurrent   [facility] [place], [place], PA-C   8 months ago Depression, recurrent   [facility] [place], [place], PA-C
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
## 2024-06-11 NOTE — Patient Instructions (Incomplete)

## 2024-06-14 ENCOUNTER — Encounter: Payer: Self-pay | Admitting: Obstetrics

## 2024-06-14 ENCOUNTER — Ambulatory Visit: Admitting: Obstetrics

## 2024-06-14 ENCOUNTER — Other Ambulatory Visit (HOSPITAL_COMMUNITY)
Admission: RE | Admit: 2024-06-14 | Discharge: 2024-06-14 | Disposition: A | Discharge status: Home / Self Care | Source: Ambulatory Visit | Attending: Obstetrics | Admitting: Obstetrics

## 2024-06-14 VITALS — BP 120/76 | HR 86 | Ht 66.5 in | Wt 240.0 lb

## 2024-06-14 DIAGNOSIS — Z124 Encounter for screening for malignant neoplasm of cervix: Secondary | ICD-10-CM | POA: Insufficient documentation

## 2024-06-14 DIAGNOSIS — Z3009 Encounter for other general counseling and advice on contraception: Secondary | ICD-10-CM

## 2024-06-14 DIAGNOSIS — Z01419 Encounter for gynecological examination (general) (routine) without abnormal findings: Secondary | ICD-10-CM | POA: Diagnosis not present

## 2024-06-14 DIAGNOSIS — Z113 Encounter for screening for infections with a predominantly sexual mode of transmission: Secondary | ICD-10-CM | POA: Diagnosis not present

## 2024-06-14 DIAGNOSIS — Z01411 Encounter for gynecological examination (general) (routine) with abnormal findings: Secondary | ICD-10-CM | POA: Diagnosis not present

## 2024-06-14 DIAGNOSIS — M6289 Other specified disorders of muscle: Secondary | ICD-10-CM | POA: Diagnosis not present

## 2024-06-14 DIAGNOSIS — Z23 Encounter for immunization: Secondary | ICD-10-CM

## 2024-06-14 MED ORDER — NORETHINDRONE 0.35 MG PO TABS: 1.0000 | ORAL_TABLET | Freq: Every day | ORAL | 3 refills | Status: AC

## 2024-06-15 LAB — LIPID PANEL
Chol/HDL Ratio: 6.3 ratio — ABNORMAL HIGH (ref 0.0–4.4)
Cholesterol, Total: 171 mg/dL (ref 100–199)
HDL: 27 mg/dL — ABNORMAL LOW (ref >39)
LDL Chol Calc (NIH): 102 mg/dL — ABNORMAL HIGH (ref 0–99)
Triglycerides: 245 mg/dL — ABNORMAL HIGH (ref 0–149)
VLDL Cholesterol Cal: 42 mg/dL — ABNORMAL HIGH (ref 5–40)

## 2024-06-15 LAB — HEMOGLOBIN A1C
Est. average glucose Bld gHb Est-mCnc: 105 mg/dL
Hgb A1c MFr Bld: 5.3 % (ref 4.8–5.6)

## 2024-06-15 LAB — CBC
Hematocrit: 37.8 % (ref 34.0–46.6)
Hemoglobin: 12.4 g/dL (ref 11.1–15.9)
MCH: 28.6 pg (ref 26.6–33.0)
MCHC: 32.8 g/dL (ref 31.5–35.7)
MCV: 87 fL (ref 79–97)
Platelets: 278 x10E3/uL (ref 150–450)
RBC: 4.33 x10E6/uL (ref 3.77–5.28)
RDW: 12.3 % (ref 11.7–15.4)
WBC: 8.3 x10E3/uL (ref 3.4–10.8)

## 2024-06-15 LAB — BASIC METABOLIC PANEL WITH GFR
BUN/Creatinine Ratio: 17 (ref 9–23)
BUN: 8 mg/dL (ref 6–20)
CO2: 20 mmol/L (ref 20–29)
Calcium: 9.1 mg/dL (ref 8.7–10.2)
Chloride: 104 mmol/L (ref 96–106)
Creatinine, Ser: 0.48 mg/dL — ABNORMAL LOW (ref 0.57–1.00)
Glucose: 75 mg/dL (ref 70–99)
Potassium: 4.1 mmol/L (ref 3.5–5.2)
Sodium: 138 mmol/L (ref 134–144)
eGFR: 135 mL/min/1.73 (ref >59)

## 2024-06-15 LAB — TSH: TSH: 1.22 u[IU]/mL (ref 0.450–4.500)

## 2024-06-15 LAB — HEP, RPR, HIV PANEL
HIV Screen 4th Generation wRfx: NONREACTIVE
Hepatitis B Surface Ag: NEGATIVE
RPR Ser Ql: NONREACTIVE

## 2024-06-17 LAB — CYTOLOGY - PAP
Chlamydia: NEGATIVE
Comment: NEGATIVE
Comment: NEGATIVE
Comment: NORMAL
Diagnosis: NEGATIVE
Neisseria Gonorrhea: NEGATIVE
Trichomonas: NEGATIVE

## 2024-06-26 ENCOUNTER — Ambulatory Visit: Payer: Self-pay | Admitting: Obstetrics

## 2024-08-01 ENCOUNTER — Emergency Department (HOSPITAL_COMMUNITY)
Admission: EM | Admit: 2024-08-01 | Discharge: 2024-08-02 | Disposition: A | Attending: Emergency Medicine | Admitting: Emergency Medicine

## 2024-08-01 ENCOUNTER — Encounter (HOSPITAL_COMMUNITY): Payer: Self-pay | Admitting: *Deleted

## 2024-08-01 ENCOUNTER — Other Ambulatory Visit: Payer: Self-pay

## 2024-08-01 DIAGNOSIS — Z91040 Latex allergy status: Secondary | ICD-10-CM | POA: Insufficient documentation

## 2024-08-01 DIAGNOSIS — U071 COVID-19: Secondary | ICD-10-CM | POA: Insufficient documentation

## 2024-08-01 DIAGNOSIS — M5441 Lumbago with sciatica, right side: Secondary | ICD-10-CM | POA: Insufficient documentation

## 2024-08-01 DIAGNOSIS — R000 Tachycardia, unspecified: Secondary | ICD-10-CM | POA: Insufficient documentation

## 2024-08-01 LAB — BASIC METABOLIC PANEL WITH GFR
Anion gap: 18 — ABNORMAL HIGH (ref 5–15)
BUN: 7 mg/dL (ref 6–20)
CO2: 21 mmol/L — ABNORMAL LOW (ref 22–32)
Calcium: 9.2 mg/dL (ref 8.9–10.3)
Chloride: 104 mmol/L (ref 98–111)
Creatinine, Ser: 0.69 mg/dL (ref 0.44–1.00)
GFR, Estimated: >60 mL/min
Glucose, Bld: 89 mg/dL (ref 70–99)
Potassium: 3.4 mmol/L — ABNORMAL LOW (ref 3.5–5.1)
Sodium: 142 mmol/L (ref 135–145)

## 2024-08-01 LAB — CBC WITH DIFFERENTIAL/PLATELET
Abs Immature Granulocytes: 0.04 10*3/uL (ref 0.00–0.07)
Basophils Absolute: 0 10*3/uL (ref 0.0–0.1)
Basophils Relative: 0 %
Eosinophils Absolute: 0 10*3/uL (ref 0.0–0.5)
Eosinophils Relative: 0 %
HCT: 39 % (ref 36.0–46.0)
Hemoglobin: 12.7 g/dL (ref 12.0–15.0)
Immature Granulocytes: 1 %
Lymphocytes Relative: 4 %
Lymphs Abs: 0.4 10*3/uL — ABNORMAL LOW (ref 0.7–4.0)
MCH: 27.7 pg (ref 26.0–34.0)
MCHC: 32.6 g/dL (ref 30.0–36.0)
MCV: 85.2 fL (ref 80.0–100.0)
Monocytes Absolute: 0.7 10*3/uL (ref 0.1–1.0)
Monocytes Relative: 8 %
Neutro Abs: 7.5 10*3/uL (ref 1.7–7.7)
Neutrophils Relative %: 87 %
Platelets: 270 10*3/uL (ref 150–400)
RBC: 4.58 MIL/uL (ref 3.87–5.11)
RDW: 13.3 % (ref 11.5–15.5)
WBC: 8.7 10*3/uL (ref 4.0–10.5)
nRBC: 0 % (ref 0.0–0.2)

## 2024-08-01 LAB — URINALYSIS, ROUTINE W REFLEX MICROSCOPIC
Bilirubin Urine: NEGATIVE
Glucose, UA: NEGATIVE mg/dL
Hgb urine dipstick: NEGATIVE
Ketones, ur: NEGATIVE mg/dL
Leukocytes,Ua: NEGATIVE
Nitrite: NEGATIVE
Protein, ur: 30 mg/dL — AB
Specific Gravity, Urine: 1.038 — ABNORMAL HIGH (ref 1.005–1.030)
pH: 5 (ref 5.0–8.0)

## 2024-08-01 LAB — RESP PANEL BY RT-PCR (RSV, FLU A&B, COVID)  RVPGX2
Influenza A by PCR: NEGATIVE
Influenza B by PCR: NEGATIVE
Resp Syncytial Virus by PCR: NEGATIVE
SARS Coronavirus 2 by RT PCR: POSITIVE — AB

## 2024-08-01 LAB — PREGNANCY, URINE: Preg Test, Ur: NEGATIVE

## 2024-08-01 LAB — LACTIC ACID, PLASMA: Lactic Acid, Venous: 2.6 mmol/L (ref 0.5–1.9)

## 2024-08-01 MED ORDER — HYDROMORPHONE HCL 1 MG/ML IJ SOLN
1.0000 mg | Freq: Once | INTRAMUSCULAR | Status: AC
  Administered 2024-08-01: 1 mg via INTRAVENOUS
  Filled 2024-08-01: qty 1

## 2024-08-01 MED ORDER — OXYCODONE-ACETAMINOPHEN 5-325 MG PO TABS: 1.0000 | ORAL_TABLET | Freq: Four times a day (QID) | ORAL | 0 refills | Status: AC | PRN

## 2024-08-01 MED ORDER — DIAZEPAM 5 MG/ML IJ SOLN
2.5000 mg | Freq: Once | INTRAMUSCULAR | Status: AC
  Administered 2024-08-01: 2.5 mg via INTRAVENOUS
  Filled 2024-08-01: qty 2

## 2024-08-01 MED ORDER — KETOROLAC TROMETHAMINE 30 MG/ML IJ SOLN
30.0000 mg | Freq: Once | INTRAMUSCULAR | Status: AC
  Administered 2024-08-01: 30 mg via INTRAVENOUS
  Filled 2024-08-01: qty 1

## 2024-08-01 MED ORDER — SODIUM CHLORIDE 0.9 % IV BOLUS
1000.0000 mL | Freq: Once | INTRAVENOUS | Status: AC
  Administered 2024-08-01: 1000 mL via INTRAVENOUS

## 2024-08-01 NOTE — Discharge Instructions (Addendum)
 We are prescribing you short course of narcotic pain medication.  Please keep well-hydrated and continue your other medications.  Follow-up with your primary care doctor.  Symptomatic treatment for COVID which is fluids and rest, Tylenol  for pain and fever.

## 2024-08-01 NOTE — ED Triage Notes (Signed)
 Pt with severe back pain and bilateral leg pain for past several hours today.
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
## 2024-08-01 NOTE — ED Notes (Signed)
 EDP made aware of Critical Lactic

## 2024-08-02 LAB — MAGNESIUM: Magnesium: 1.9 mg/dL (ref 1.7–2.4)

## 2024-08-02 LAB — LACTIC ACID, PLASMA: Lactic Acid, Venous: 1.2 mmol/L (ref 0.5–1.9)

## 2024-08-02 MED ORDER — GABAPENTIN 400 MG PO CAPS
400.0000 mg | ORAL_CAPSULE | Freq: Three times a day (TID) | ORAL | Status: DC
  Administered 2024-08-02: 400 mg via ORAL
  Filled 2024-08-02: qty 1

## 2024-08-02 MED ORDER — POTASSIUM CHLORIDE CRYS ER 20 MEQ PO TBCR
40.0000 meq | EXTENDED_RELEASE_TABLET | Freq: Once | ORAL | Status: AC
  Administered 2024-08-02: 40 meq via ORAL
  Filled 2024-08-02: qty 2

## 2024-08-02 MED ORDER — BACLOFEN 10 MG PO TABS
20.0000 mg | ORAL_TABLET | Freq: Once | ORAL | Status: AC
  Administered 2024-08-02: 20 mg via ORAL
  Filled 2024-08-02: qty 2
